# Patient Record
Sex: Male | Born: 1970
Health system: Southern US, Community
[De-identification: ages and names within clinical notes are randomized; demographics above are authoritative.]

## PROBLEM LIST (undated history)

## (undated) DIAGNOSIS — G709 Myoneural disorder, unspecified: Secondary | ICD-10-CM

## (undated) DIAGNOSIS — S99912A Unspecified injury of left ankle, initial encounter: Secondary | ICD-10-CM

## (undated) DIAGNOSIS — R51 Headache: Secondary | ICD-10-CM

## (undated) DIAGNOSIS — T7840XA Allergy, unspecified, initial encounter: Secondary | ICD-10-CM

## (undated) DIAGNOSIS — H3320 Serous retinal detachment, unspecified eye: Secondary | ICD-10-CM

## (undated) DIAGNOSIS — K589 Irritable bowel syndrome without diarrhea: Secondary | ICD-10-CM

## (undated) DIAGNOSIS — K219 Gastro-esophageal reflux disease without esophagitis: Secondary | ICD-10-CM

## (undated) DIAGNOSIS — K259 Gastric ulcer, unspecified as acute or chronic, without hemorrhage or perforation: Secondary | ICD-10-CM

## (undated) DIAGNOSIS — F32A Depression, unspecified: Secondary | ICD-10-CM

## (undated) DIAGNOSIS — F419 Anxiety disorder, unspecified: Secondary | ICD-10-CM

## (undated) DIAGNOSIS — K644 Residual hemorrhoidal skin tags: Secondary | ICD-10-CM

## (undated) DIAGNOSIS — S99911A Unspecified injury of right ankle, initial encounter: Secondary | ICD-10-CM

## (undated) DIAGNOSIS — K625 Hemorrhage of anus and rectum: Secondary | ICD-10-CM

## (undated) DIAGNOSIS — F329 Major depressive disorder, single episode, unspecified: Secondary | ICD-10-CM

## (undated) DIAGNOSIS — E7800 Pure hypercholesterolemia, unspecified: Secondary | ICD-10-CM

## (undated) DIAGNOSIS — H269 Unspecified cataract: Secondary | ICD-10-CM

## (undated) HISTORY — PX: UPPER GASTROINTESTINAL ENDOSCOPY: SHX188

## (undated) HISTORY — DX: Irritable bowel syndrome, unspecified: K58.9

## (undated) HISTORY — DX: Headache: R51

## (undated) HISTORY — DX: Anxiety disorder, unspecified: F41.9

## (undated) HISTORY — DX: Serous retinal detachment, unspecified eye: H33.20

## (undated) HISTORY — PX: OTHER SURGICAL HISTORY: SHX169

## (undated) HISTORY — DX: Unspecified injury of right ankle, initial encounter: S99.911A

## (undated) HISTORY — PX: RETINAL DETACHMENT SURGERY: SHX105

## (undated) HISTORY — DX: Allergy, unspecified, initial encounter: T78.40XA

## (undated) HISTORY — DX: Hemorrhage of anus and rectum: K62.5

## (undated) HISTORY — DX: Major depressive disorder, single episode, unspecified: F32.9

## (undated) HISTORY — PX: CATARACT EXTRACTION, BILATERAL: SHX1313

## (undated) HISTORY — DX: Unspecified injury of left ankle, initial encounter: S99.912A

## (undated) HISTORY — PX: POLYPECTOMY: SHX149

## (undated) HISTORY — DX: Depression, unspecified: F32.A

## (undated) HISTORY — PX: COLONOSCOPY: SHX174

## (undated) HISTORY — PX: NASAL SEPTUM SURGERY: SHX37

## (undated) HISTORY — DX: Residual hemorrhoidal skin tags: K64.4

## (undated) HISTORY — PX: HERNIA REPAIR: SHX51

## (undated) HISTORY — DX: Unspecified cataract: H26.9

## (undated) HISTORY — DX: Gastro-esophageal reflux disease without esophagitis: K21.9

## (undated) HISTORY — DX: Myoneural disorder, unspecified: G70.9

---

## 2004-11-11 ENCOUNTER — Encounter: Payer: Self-pay | Admitting: Physician Assistant

## 2004-11-16 ENCOUNTER — Encounter: Payer: Self-pay | Admitting: Physician Assistant

## 2008-01-04 ENCOUNTER — Ambulatory Visit: Admission: RE | Admit: 2008-01-04 | Discharge: 2008-01-04 | Payer: Self-pay | Admitting: Ophthalmology

## 2008-06-19 ENCOUNTER — Ambulatory Visit (HOSPITAL_COMMUNITY): Admission: RE | Admit: 2008-06-19 | Discharge: 2008-06-19 | Payer: Self-pay | Admitting: Chiropractic Medicine

## 2008-07-09 ENCOUNTER — Ambulatory Visit: Payer: Self-pay | Admitting: Internal Medicine

## 2008-07-09 DIAGNOSIS — R413 Other amnesia: Secondary | ICD-10-CM | POA: Insufficient documentation

## 2008-07-09 LAB — CONVERTED CEMR LAB
AST: 26 units/L (ref 0–37)
Albumin: 4.6 g/dL (ref 3.5–5.2)
Alkaline Phosphatase: 65 units/L (ref 39–117)
Chloride: 108 meq/L (ref 96–112)
Eosinophils Relative: 8.4 % — ABNORMAL HIGH (ref 0.0–5.0)
Folate: >20 ng/mL
Free T4: 0.7 ng/dL (ref 0.6–1.6)
GFR calc Af Amer: 108 mL/min
GFR calc non Af Amer: 89 mL/min
HCT: 43.7 % (ref 39.0–52.0)
HDL: 49.5 mg/dL (ref >39.0)
Hemoglobin, Urine: NEGATIVE
LDL Cholesterol: 123 mg/dL — ABNORMAL HIGH (ref 0–99)
Lymphocytes Relative: 39.8 % (ref 12.0–46.0)
Monocytes Absolute: 0.5 10*3/uL (ref 0.1–1.0)
Monocytes Relative: 8.7 % (ref 3.0–12.0)
Mucus, UA: NEGATIVE
Nitrite: NEGATIVE
Platelets: 214 10*3/uL (ref 150–400)
Potassium: 4.8 meq/L (ref 3.5–5.1)
Sodium: 143 meq/L (ref 135–145)
Specific Gravity, Urine: 1.02 (ref 1.000–1.03)
Total Bilirubin: 2.2 mg/dL — ABNORMAL HIGH (ref 0.3–1.2)
Total CHOL/HDL Ratio: 4
Total Protein, Urine: NEGATIVE mg/dL
Urine Glucose: NEGATIVE mg/dL
Vitamin B-12: 380 pg/mL (ref 211–911)
WBC, UA: NONE SEEN cells/hpf
WBC: 5.6 10*3/uL (ref 4.5–10.5)
pH: 6.5 (ref 5.0–8.0)

## 2008-07-10 ENCOUNTER — Encounter: Payer: Self-pay | Admitting: Internal Medicine

## 2008-07-10 LAB — CONVERTED CEMR LAB: Chlamydia, Swab/Urine, PCR: NEGATIVE

## 2008-07-12 ENCOUNTER — Encounter: Payer: Self-pay | Admitting: Internal Medicine

## 2008-08-04 ENCOUNTER — Ambulatory Visit: Payer: Self-pay | Admitting: Internal Medicine

## 2008-08-05 ENCOUNTER — Encounter: Payer: Self-pay | Admitting: Internal Medicine

## 2008-08-07 ENCOUNTER — Telehealth: Payer: Self-pay | Admitting: Internal Medicine

## 2009-02-19 ENCOUNTER — Ambulatory Visit: Payer: Self-pay | Admitting: Internal Medicine

## 2009-02-19 DIAGNOSIS — F4321 Adjustment disorder with depressed mood: Secondary | ICD-10-CM | POA: Insufficient documentation

## 2009-03-19 ENCOUNTER — Ambulatory Visit: Payer: Self-pay | Admitting: Internal Medicine

## 2009-03-19 DIAGNOSIS — J069 Acute upper respiratory infection, unspecified: Secondary | ICD-10-CM | POA: Insufficient documentation

## 2009-04-06 ENCOUNTER — Ambulatory Visit: Payer: Self-pay | Admitting: Internal Medicine

## 2009-05-08 ENCOUNTER — Ambulatory Visit: Payer: Self-pay | Admitting: Internal Medicine

## 2009-06-29 ENCOUNTER — Ambulatory Visit: Payer: Self-pay | Admitting: Internal Medicine

## 2009-06-29 DIAGNOSIS — H6990 Unspecified Eustachian tube disorder, unspecified ear: Secondary | ICD-10-CM | POA: Insufficient documentation

## 2009-06-29 DIAGNOSIS — H6980 Other specified disorders of Eustachian tube, unspecified ear: Secondary | ICD-10-CM | POA: Insufficient documentation

## 2009-06-29 DIAGNOSIS — R0982 Postnasal drip: Secondary | ICD-10-CM | POA: Insufficient documentation

## 2009-08-14 ENCOUNTER — Ambulatory Visit: Payer: Self-pay | Admitting: Internal Medicine

## 2009-08-14 DIAGNOSIS — M79609 Pain in unspecified limb: Secondary | ICD-10-CM | POA: Insufficient documentation

## 2009-08-14 DIAGNOSIS — K644 Residual hemorrhoidal skin tags: Secondary | ICD-10-CM | POA: Insufficient documentation

## 2009-08-14 DIAGNOSIS — R197 Diarrhea, unspecified: Secondary | ICD-10-CM | POA: Insufficient documentation

## 2009-08-27 ENCOUNTER — Encounter: Payer: Self-pay | Admitting: Internal Medicine

## 2009-09-25 ENCOUNTER — Ambulatory Visit: Payer: Self-pay | Admitting: Internal Medicine

## 2009-09-25 DIAGNOSIS — R1084 Generalized abdominal pain: Secondary | ICD-10-CM | POA: Insufficient documentation

## 2009-09-25 LAB — CONVERTED CEMR LAB
ALT: 16 units/L (ref 0–53)
AST: 20 units/L (ref 0–37)
Albumin: 4.9 g/dL (ref 3.5–5.2)
Basophils Absolute: 0.1 10*3/uL (ref 0.0–0.1)
Basophils Relative: 1 % (ref 0–1)
Bilirubin, Direct: 0.3 mg/dL (ref 0.0–0.3)
Calcium: 10 mg/dL (ref 8.4–10.5)
Creatinine, Ser: 0.92 mg/dL (ref 0.40–1.50)
Hemoglobin: 14.3 g/dL (ref 13.0–17.0)
Indirect Bilirubin: 1.2 mg/dL — ABNORMAL HIGH (ref 0.0–0.9)
Lipase: 25 units/L (ref 0–75)
Lymphocytes Relative: 48 % — ABNORMAL HIGH (ref 12–46)
MCHC: 34 g/dL (ref 30.0–36.0)
Monocytes Absolute: 0.5 10*3/uL (ref 0.1–1.0)
Neutro Abs: 2.6 10*3/uL (ref 1.7–7.7)
Neutrophils Relative %: 39 % — ABNORMAL LOW (ref 43–77)
RBC: 4.77 M/uL (ref 4.22–5.81)
RDW: 12.7 % (ref 11.5–15.5)
Sed Rate: 2 mm/hr (ref 0–16)
Sodium: 141 meq/L (ref 135–145)
Tissue Transglutaminase Ab, IgA: 5.7 units (ref <20)
Total Bilirubin: 1.5 mg/dL — ABNORMAL HIGH (ref 0.3–1.2)
Total Protein: 7.1 g/dL (ref 6.0–8.3)

## 2009-09-30 ENCOUNTER — Telehealth: Payer: Self-pay | Admitting: Internal Medicine

## 2009-10-04 ENCOUNTER — Encounter: Payer: Self-pay | Admitting: Internal Medicine

## 2009-10-04 ENCOUNTER — Telehealth: Payer: Self-pay | Admitting: Internal Medicine

## 2009-10-05 ENCOUNTER — Telehealth: Payer: Self-pay | Admitting: Internal Medicine

## 2009-10-06 ENCOUNTER — Telehealth: Payer: Self-pay | Admitting: Gastroenterology

## 2009-10-13 ENCOUNTER — Ambulatory Visit: Payer: Self-pay | Admitting: Gastroenterology

## 2009-10-13 DIAGNOSIS — R109 Unspecified abdominal pain: Secondary | ICD-10-CM | POA: Insufficient documentation

## 2009-10-13 DIAGNOSIS — K219 Gastro-esophageal reflux disease without esophagitis: Secondary | ICD-10-CM | POA: Insufficient documentation

## 2009-10-13 DIAGNOSIS — R1310 Dysphagia, unspecified: Secondary | ICD-10-CM | POA: Insufficient documentation

## 2009-10-19 ENCOUNTER — Telehealth (INDEPENDENT_AMBULATORY_CARE_PROVIDER_SITE_OTHER): Payer: Self-pay | Admitting: *Deleted

## 2009-10-27 ENCOUNTER — Encounter (INDEPENDENT_AMBULATORY_CARE_PROVIDER_SITE_OTHER): Payer: Self-pay | Admitting: *Deleted

## 2009-10-30 ENCOUNTER — Ambulatory Visit: Payer: Self-pay | Admitting: Gastroenterology

## 2009-11-11 ENCOUNTER — Ambulatory Visit: Payer: Self-pay | Admitting: Gastroenterology

## 2009-11-11 LAB — HM COLONOSCOPY: HM Colonoscopy: NORMAL

## 2009-11-27 ENCOUNTER — Ambulatory Visit: Payer: Self-pay | Admitting: Internal Medicine

## 2009-12-03 ENCOUNTER — Encounter: Payer: Self-pay | Admitting: Internal Medicine

## 2010-01-08 ENCOUNTER — Ambulatory Visit: Payer: Self-pay | Admitting: Gastroenterology

## 2010-05-05 ENCOUNTER — Encounter: Payer: Self-pay | Admitting: Internal Medicine

## 2010-05-06 ENCOUNTER — Ambulatory Visit: Payer: Self-pay | Admitting: Internal Medicine

## 2010-05-06 ENCOUNTER — Encounter: Payer: Self-pay | Admitting: Internal Medicine

## 2010-05-06 LAB — CONVERTED CEMR LAB
ALT: 14 U/L
AST: 21 U/L
Albumin: 5.1 g/dL
Alkaline Phosphatase: 71 U/L
BUN: 18 mg/dL
Bilirubin, Direct: 0.3 mg/dL
CO2: 29 meq/L
Calcium: 10.2 mg/dL
Chloride: 102 meq/L
Creatinine, Ser: 0.95 mg/dL
Glucose, Bld: 93 mg/dL
Hgb A1c MFr Bld: 5.3 %
Indirect Bilirubin: 1.4 mg/dL — ABNORMAL HIGH
Potassium: 5.3 meq/L
Sodium: 141 meq/L
TSH: 1.664 u[IU]/mL
Total Bilirubin: 1.7 mg/dL — ABNORMAL HIGH
Total Protein: 7.2 g/dL
Vitamin B-12: 742 pg/mL

## 2010-05-10 ENCOUNTER — Encounter: Payer: Self-pay | Admitting: Internal Medicine

## 2010-05-11 ENCOUNTER — Ambulatory Visit: Payer: Self-pay | Admitting: Internal Medicine

## 2010-05-11 DIAGNOSIS — D239 Other benign neoplasm of skin, unspecified: Secondary | ICD-10-CM | POA: Insufficient documentation

## 2010-05-11 DIAGNOSIS — R748 Abnormal levels of other serum enzymes: Secondary | ICD-10-CM | POA: Insufficient documentation

## 2010-05-12 ENCOUNTER — Telehealth: Payer: Self-pay | Admitting: Internal Medicine

## 2010-05-12 ENCOUNTER — Ambulatory Visit (HOSPITAL_BASED_OUTPATIENT_CLINIC_OR_DEPARTMENT_OTHER)
Admission: RE | Admit: 2010-05-12 | Discharge: 2010-05-12 | Payer: Self-pay | Source: Home / Self Care | Attending: Surgical Oncology | Admitting: Surgical Oncology

## 2010-05-14 ENCOUNTER — Encounter: Payer: Self-pay | Admitting: Internal Medicine

## 2010-05-15 ENCOUNTER — Encounter: Payer: Self-pay | Admitting: Internal Medicine

## 2010-06-11 IMAGING — CR DG THORACIC SPINE 2V
5 series · 5 of 5 positions shown · non-contrast
Comparison: Cervical radiographs done concurrently.

CLINICAL DATA: Chronic cervicothoracic pain.  No acute injury.

THORACIC SPINE - 2 VIEW

[t t-spine a.p.]
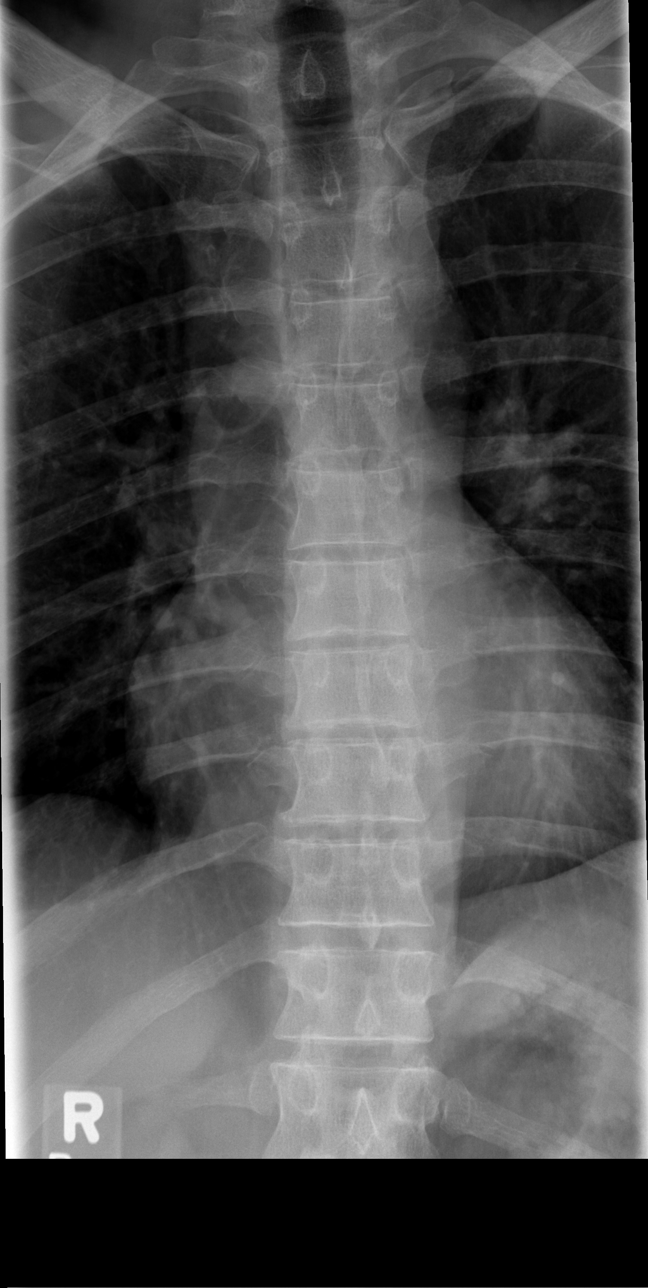

[t t-spine lat (1 of 2)]
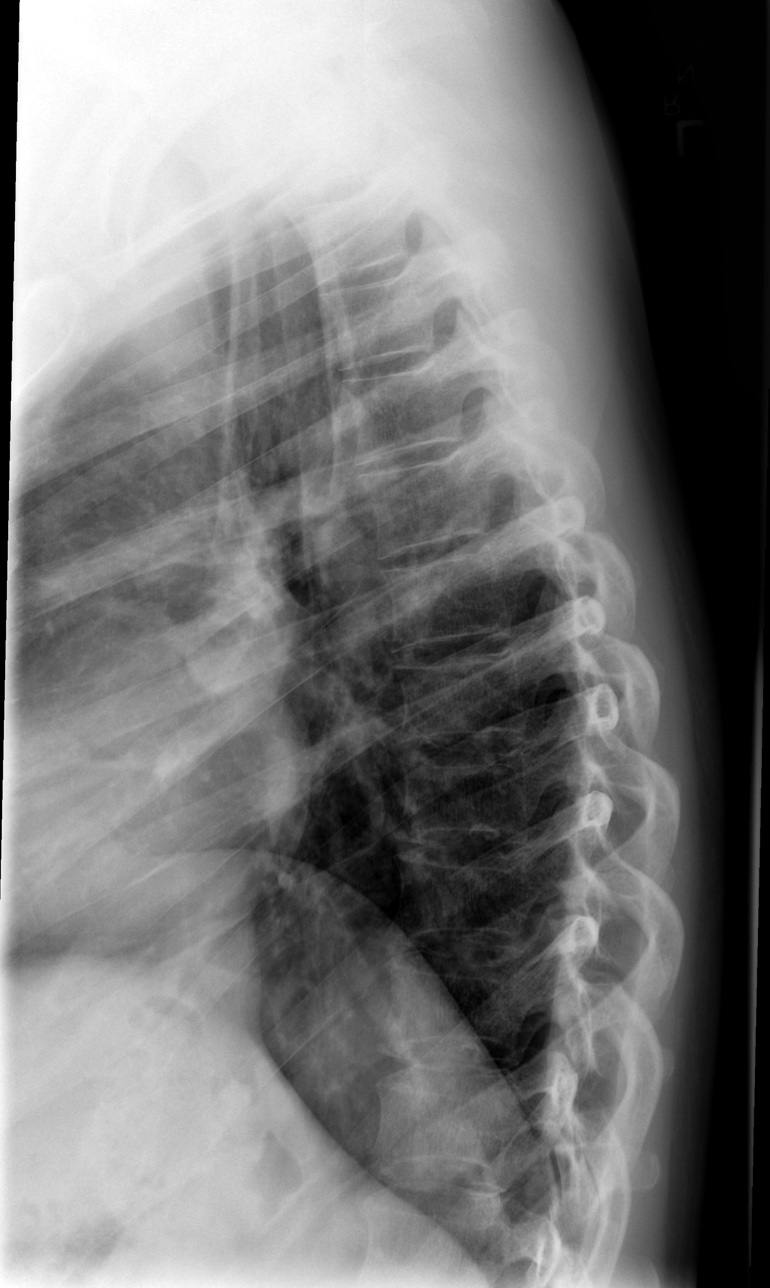

[t t-spine lat (2 of 2)]
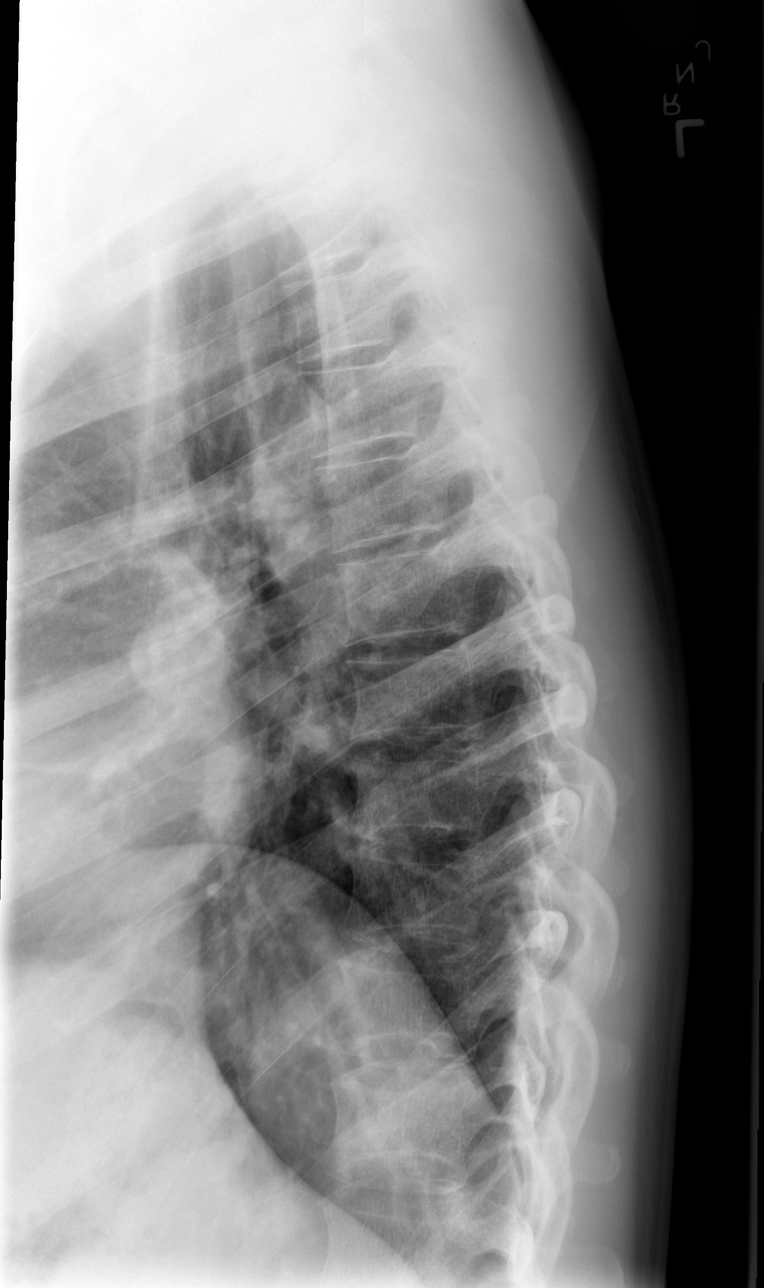

[t swimmers * (1 of 2)]
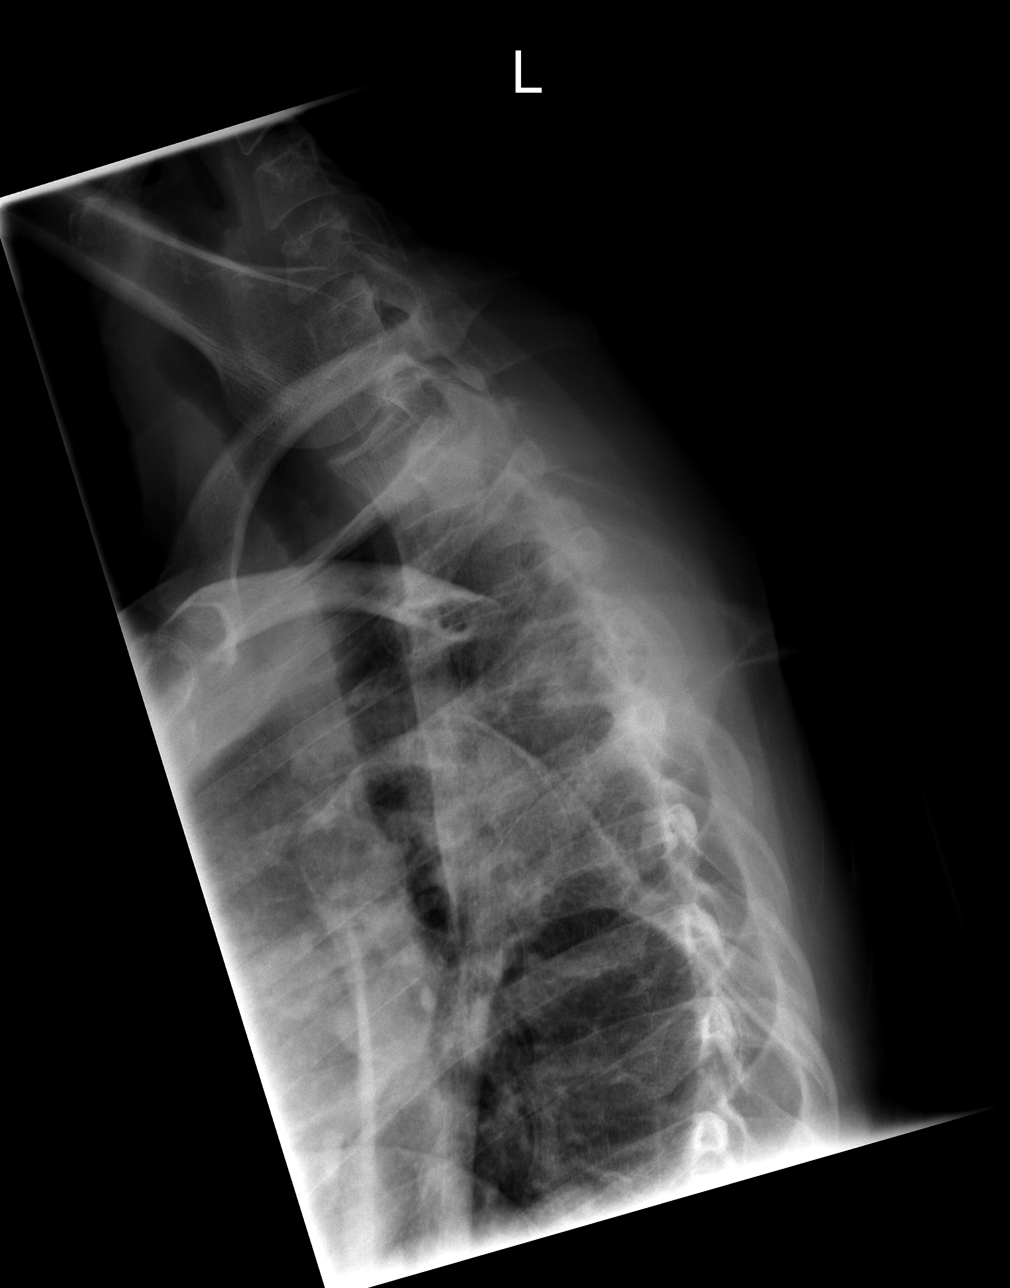

[t swimmers * (2 of 2)]
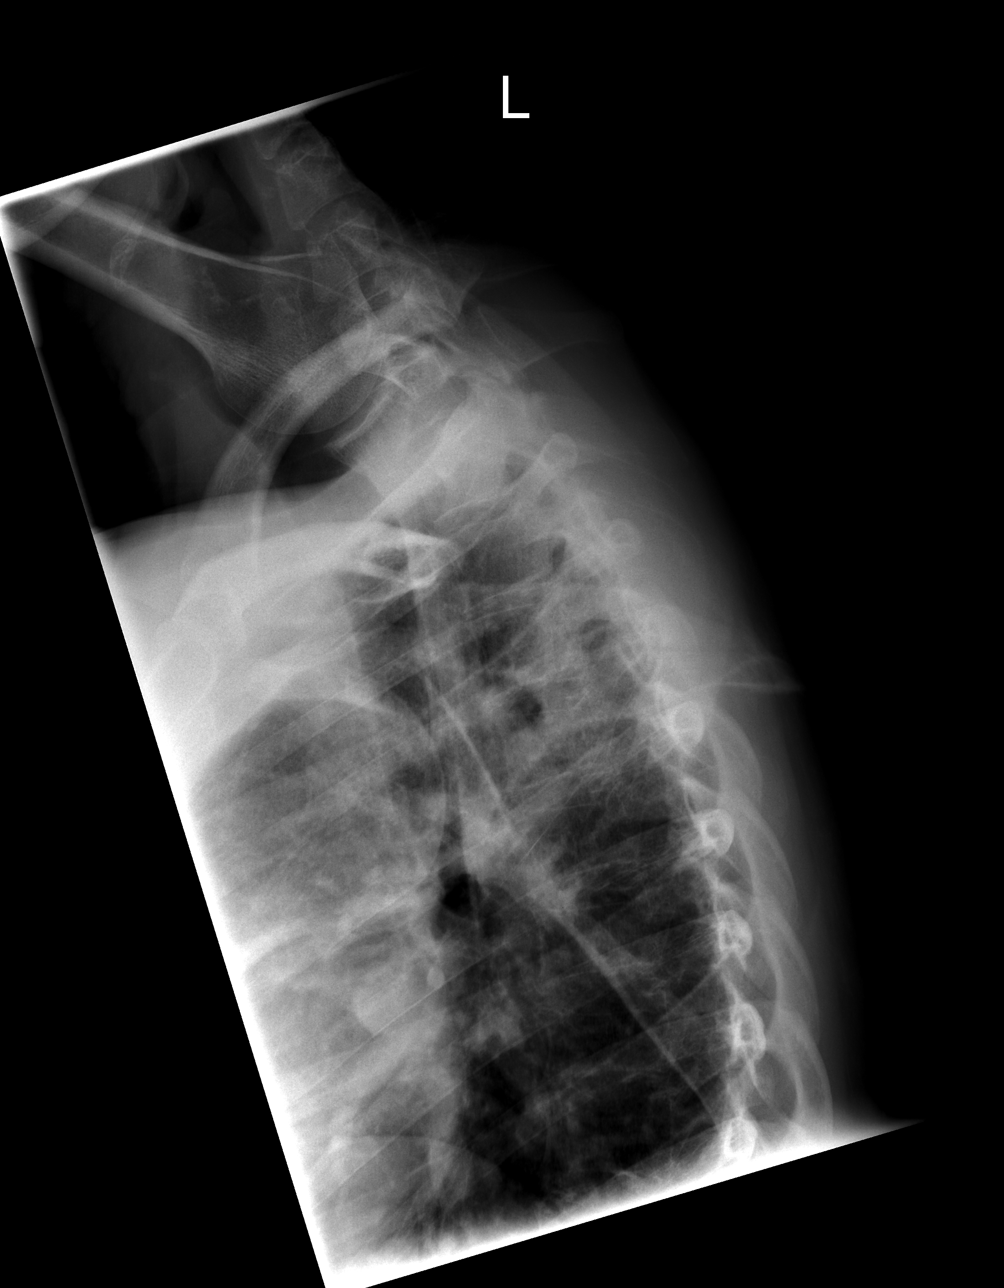

[5 of 5 positions shown; findings below may reference images not displayed]

FINDINGS: There is conventional anatomy with 12 rib-bearing
thoracic type vertebral bodies.  The alignment is normal.  There is
no evidence of fracture, paraspinal hematoma or widening of the
interpediculate distance.  The thoracic disc spaces appear
preserved.
IMPRESSION: Negative thoracic spine series.

## 2010-06-11 IMAGING — CR DG CERVICAL SPINE COMPLETE 4+V
5 series · 5 of 5 positions shown · non-contrast
Comparison: None.

CLINICAL DATA: Diffuse neck pain.  No acute injury.

CERVICAL SPINE - COMPLETE 4+ VIEW

[w c-spine lat *]
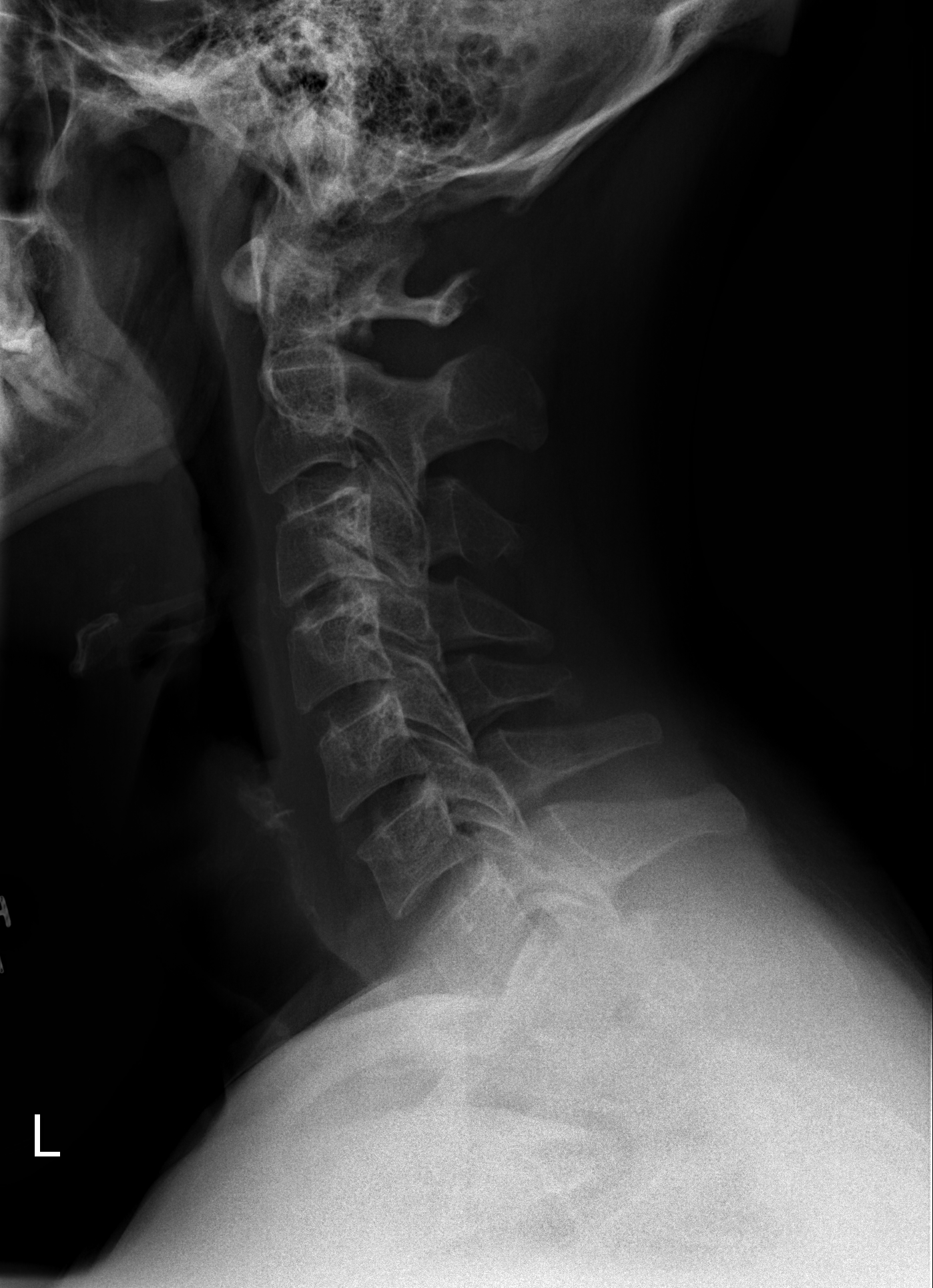

[w c-spine oblique * (1 of 2)]
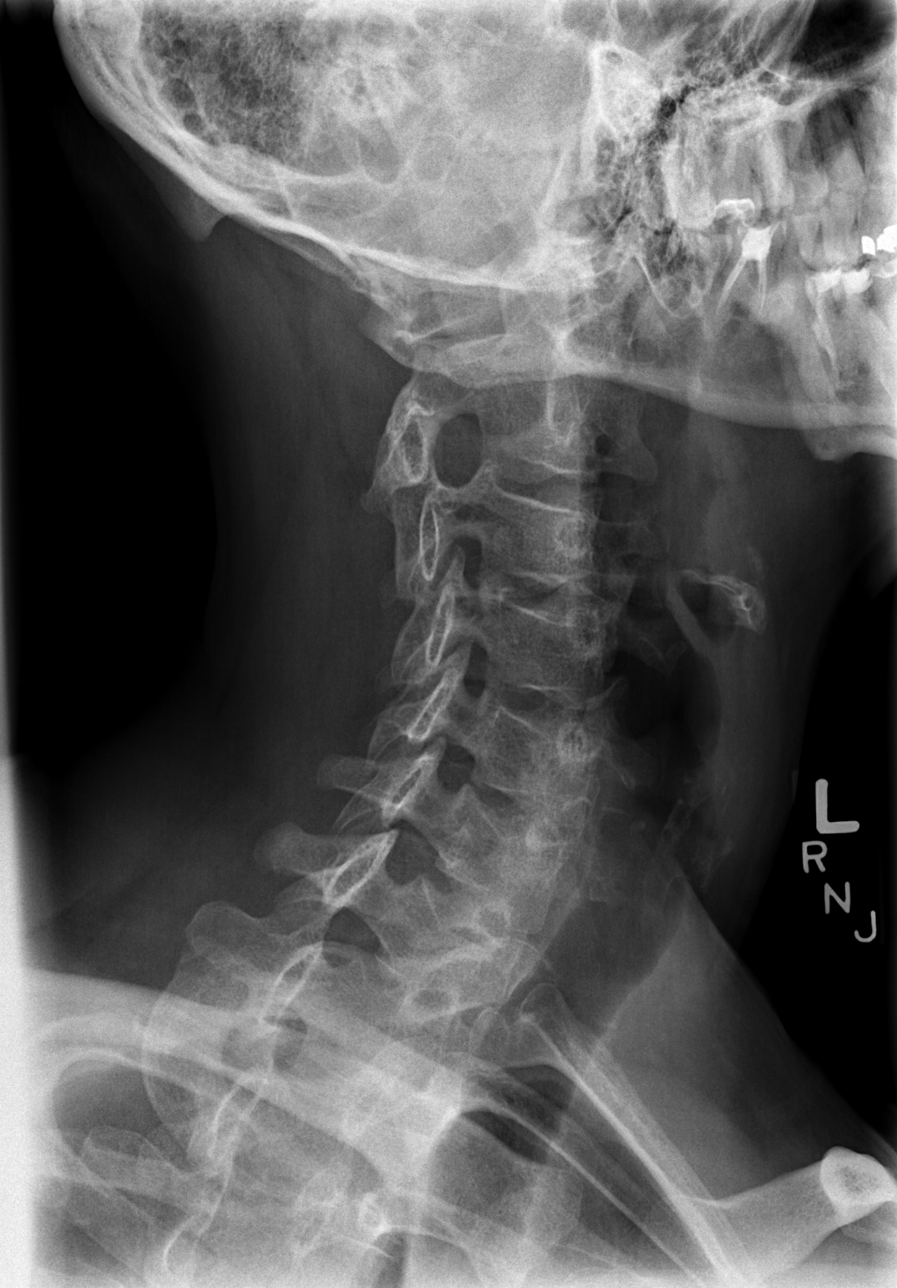

[w c-spine oblique * (2 of 2)]
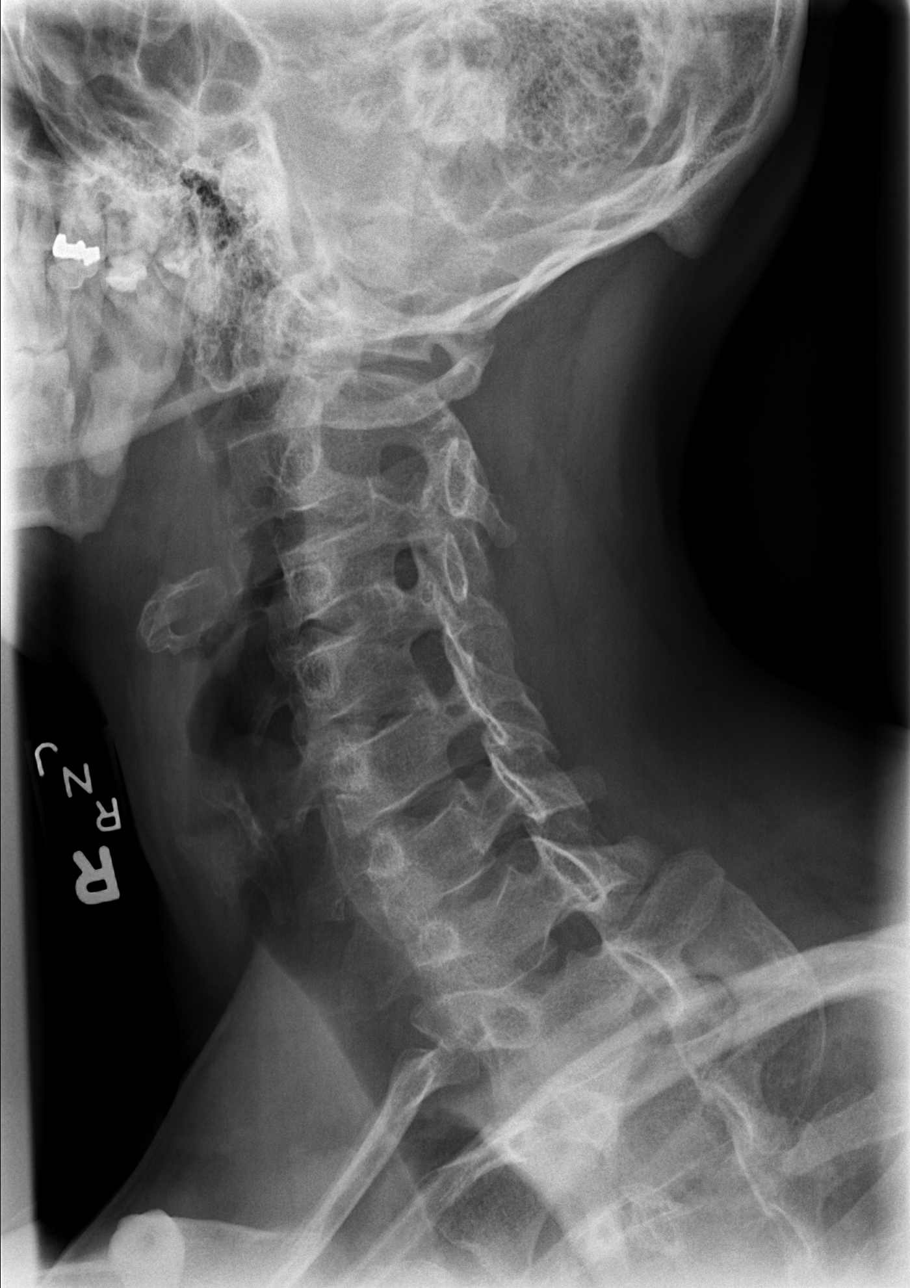

[w c-spine a.p. *]
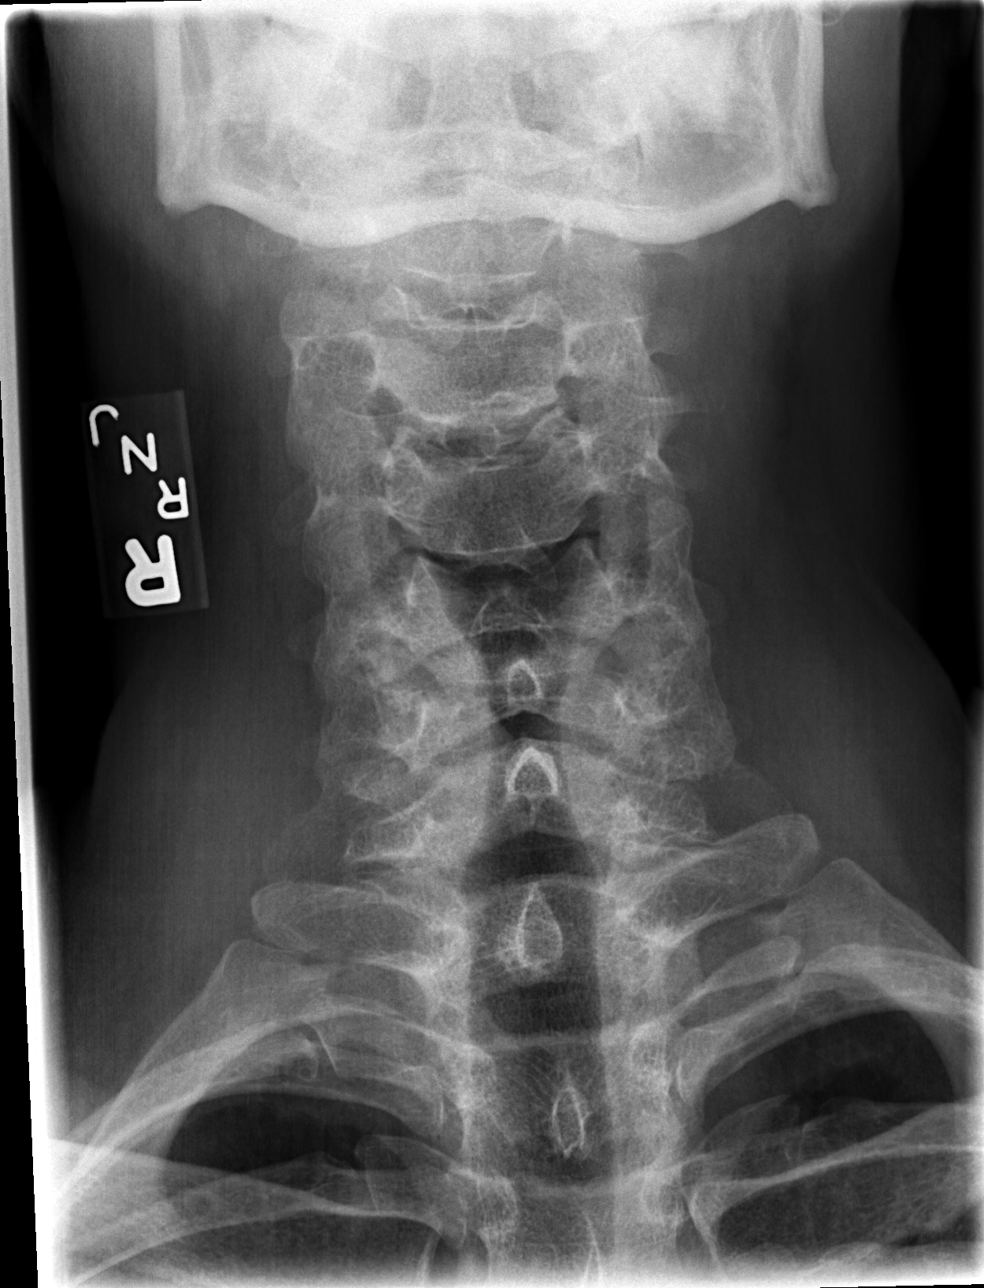

[w c-spine odontoid *]
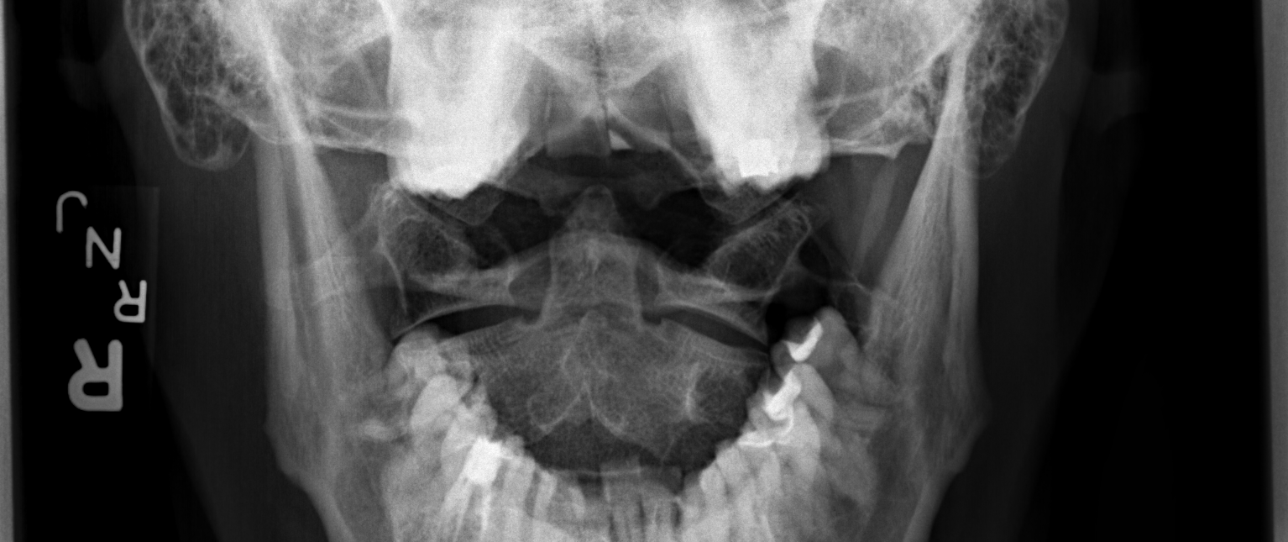

[5 of 5 positions shown; findings below may reference images not displayed]

FINDINGS: The prevertebral soft tissues are normal.  The alignment
is anatomic through T1.  The cervical disc spaces are preserved.
The oblique views demonstrate probable mild biforaminal stenosis at
C3-C4 due to facet hypertrophy.  The additional neural foramina are
widely patent.  The C1-C2 articulation appears normal in the AP
projection.  There is no evidence of acute fracture or subluxation.
IMPRESSION: No acute findings.  Possible mild biforaminal stenosis at C3-C4.

## 2010-06-29 NOTE — Assessment & Plan Note (Signed)
  Review of gastrointestinal problems: 1. IBS-diarrhea predominant: colonoscopy June 2011 showed diverticulosis, normal TI, normal random colon biopsies.    History of Present Illness Visit Type: Follow-up Visit Primary GI MD: Rob Bunting MD Primary Ashtyn Freilich: Dondra Spry DO Chief Complaint: 6 week f/u IBS, Colonoscopy History of Present Illness:     very pleasant 40 year old man whom I last saw the time of his colonoscopy. See those results summarized above.cut out ice cream, cheese;  noticed these were causing stomach problems.  Has been taking one immodium, once daily.  this is helping. He thinks the antispasmodics medicines helped a bit better however.           Current Medications (verified): 1)  Travatan 0.004 % Soln (Travoprost) .... One Drop Each Eye Once Daily 2)  Digestive Advantage Intensive Bowel Support .... Take 1 Capsule By Mouth Once A Day 3)  Timentin 3.1 Gm/133ml Soln (Ticarcillin-Pot Clavulanate) .... Left Eye Once Daily  Allergies (verified): No Known Drug Allergies  Vital Signs:  Patient profile:   40 year old male Height:      74 inches Weight:      197.25 pounds BMI:     25.42 Pulse rate:   70 / minute Pulse rhythm:   regular BP sitting:   122 / 74  (left arm) Cuff size:   regular  Vitals Entered By: Christie Nottingham CMA Duncan Dull) (January 08, 2010 1:47 PM)  Physical Exam  Additional Exam:  Constitutional: generally well appearing Psychiatric: alert and oriented times 3 Abdomen: soft, non-tender, non-distended, normal bowel sounds    Impression & Recommendations:  Problem # 1:  diarrhea predominant IBS he will continue on either Imodium or scheduled antispasmodics. Both seem to help him well. He knows to return if he has any further questions or concerns.  Patient Instructions: 1)  Will switch the levsin to twice daily, scheduled antiemetics. 2)  Stop the immodium. 3)  Return to see Dr. Christella Hartigan as needed. 4)  The medication list was  reviewed and reconciled.  All changed / newly prescribed medications were explained.  A complete medication list was provided to the patient / caregiver. Prescriptions: LEVBID 0.375 MG  TB12 (HYOSCYAMINE SULFATE) take one pill two times a day  #60 x 6   Entered and Authorized by:   Rachael Fee MD   Signed by:   Rachael Fee MD on 01/08/2010   Method used:   Faxed to ...       [id number] (retail)       [address]       [place], [place]  [address]       Ph: [phone]       Fax: [phone]   RxID:   [id number]

## 2010-06-29 NOTE — Assessment & Plan Note (Signed)
Summary: Sharp pain in right foot- jr   Vital Signs:  Patient profile:   40 year old male Weight:      193 pounds Temp:     98.1 degrees F oral Pulse rate:   68 / minute Pulse rhythm:   regular Resp:     16 per minute BP sitting:   110 / 70  (right arm) Cuff size:   regular  Vitals Entered By: Mervin Kung CMA (August 14, 2009 1:24 PM) Is Patient Diabetic? No   Primary Care Provider:  Dondra Spry DO   History of Present Illness: 40 y/o white male c/o intermittent right foot pain x 1-2 weeks.   no injury or trauma. 2-4th digits are numb no change in skin coler painful to walk  hemorrhoids - rectal bleeding.  prev episode in 2005-2006 freq BMs.  some are like pellets, other times loose can't tolerate any cheese however, he eats cereal every moring (2 bowls) with milk abd bloating, gassy avoids golf outings due to symptoms  no family hx of IBD  Allergies (verified): No Known Drug Allergies  Past History:  Past Medical History: History of concussions History of prostatitis      Depression      External hemorrhoids Hx of rectal bleeding - negative colonoscopy 2005-2006  Past Surgical History: colonoscopy 2005-2006 ([facility])   Family History: Family History Hypertension (father) Family History Diabetes 1st degree relative (father) Family History High cholesterol Depression - PGF (committed suicide) Colon ca - no Prostate ca - MGF Hypothyroidism   No family of IBD          Social History: Married (18 months) No children Never Smoked   Alcohol use-yes    Regular exercise-yes        Physical Exam  General:  alert, well-developed, and well-nourished.   Lungs:  normal respiratory effort and normal breath sounds.   Heart:  normal rate, regular rhythm, and no gallop.   Abdomen:  mild lower abd tenderness,  no massessoft, normal bowel sounds, no hepatomegaly, and no splenomegaly.   Rectal:  normal sphincter tone, no masses, and external  hemorrhoid(s).   Msk:  right foot - no redness,  no tenderness,  no joint deformity Pulses:  dorsalis pedis and posterior tibial pulses are full and equal bilaterally Extremities:  No lower extremity edema    Impression & Recommendations:  Problem # 1:  HEMORRHOIDS, EXTERNAL (ICD-455.3) Pt with ext hemorrhoids.  use hemorrhoid cream.  use sitz bath.  I doubt he will need hemorrhoid surgery.  Problem # 2:  FOOT PAIN, RIGHT (ICD-729.5) 40 y/o with intermittent sharp right foot/toe pain.  2-4 th digit.  he also complains of tingling sensation.  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  consider morton's neuroma.  use OTC NSAIDs for now.  refer to Dr. Victorino Dike for further eval and tx Orders: Orthopedic Referral (Ortho)  Problem # 3:  LOOSE STOOLS (ICD-787.91) Pt with chronic loose stools.  He likely has lactose intolerance.  use lactaid milk.  If persistent symptoms, consider GI workup.  possible IBS  Complete Medication List: 1)  Travatan 0.004 % Soln (Travoprost) .... One drop each eye once daily 2)  Hydrocortisone Ace-pramoxine 2.5-1 % Crea (Hydrocortisone ace-pramoxine) .... Apply two times a day x 7 days  Patient Instructions: 1)  Use lactaid mild (low fat).  2)  Please schedule a follow-up appointment in 1 month. 3)  Take ibuprofen 400 mg two times a day as needed with food for 3-5 days.  4)  Sitz bath daily (epsom salt) for 3-5 days Prescriptions: HYDROCORTISONE ACE-PRAMOXINE 2.5-1 % CREA (HYDROCORTISONE ACE-PRAMOXINE) apply two times a day x 7 days  #60 grams x 0   Entered and Authorized by:   D. Thomos Lemons DO   Signed by:   D. Thomos Lemons DO on 08/14/2009   Method used:   Electronically to        [facility] [id number] (retail)       [address] [place], [place]  [address]       Ph: [phone] or [phone]       Fax: [phone]   RxID:   [id number]   Current Allergies (reviewed today): No known allergies

## 2010-06-29 NOTE — Assessment & Plan Note (Signed)
Summary: Abd pain/dfs   History of Present Illness Visit Type: Initial Visit Primary GI MD: Rob Bunting MD Primary Provider: Dondra Spry DO Chief Complaint: abdominal pain that has subsided when patient started Omeprazole & hyoscyamine History of Present Illness:   PLEASANT 40 YO MALE  NEW TO GI TODAY, SENT BY DR. Artist Pais  FOR EVALUATION OF ABDOMINAL PAIN,CRAMPING OVER THE PAST FEW MONTHS. HE HAS ALSO HAD AN INCREASE IN ACID REFLUX SXS. HE DESCRIBES MID ABDOMINAL CRAMPY TYPE DISCOMFORT WHICH IS NOT SEVERE BUT HAS BEEN PERSISTENT, AND "ANNOYING".HE HAS BEEN HAVING 4 BM'S/DAY,NO MELENA OR HEME.HE HAS INTERMITTENT EPISODES OF POST PRANDIAL DIARRHEA,AND OCCASIONAL NOCTURNAL EPISODES. HE HAS TRIED LACTOSE AVOIDANCE WITH NO IMPROVEMENT IN SXS, AND BRIEFLY TRIED A GLUTEN FREE DIET WITH NO CHANGE.  HE HAD A COLONOSCOPY ABOUT 5 YEARS AGO IN [place] ,WHICH HE WAS TOLD WAS NORMAL.(DR. REITER). HE HAS HAD SCREENING FOR CELIAC DISEASE  WITH NEGATIVE MARKERS, AND BASELINE LABS ALSO UNREMARKABLE.DR. YOO STARTED OMEPRAZOLE AND LEVSIN  LAST WEEK AND HE FEELS BETTER.  PT ALSO REPORTS THAT HIS SXS ARE TYPICALLY WORSE IN THE WINTER WHEN HE IS SIGNIFICANTLY MORE STRESSED AT WORK.   GI Review of Systems    Reports abdominal pain, acid reflux, and  bloating.     Location of  Abdominal pain: upper abdomen.    Denies belching, chest pain, dysphagia with liquids, dysphagia with solids, heartburn, loss of appetite, nausea, vomiting, vomiting blood, weight loss, and  weight gain.      Reports diarrhea, hemorrhoids, irritable bowel syndrome, rectal bleeding, and  rectal pain.     Denies anal fissure, black tarry stools, change in bowel habit, constipation, diverticulosis, fecal incontinence, heme positive stool, jaundice, light color stool, and  liver problems.    Current Medications (verified): 1)  Travatan 0.004 % Soln (Travoprost) .... One Drop Each Eye Once Daily 2)  Hydrocortisone Ace-Pramoxine 2.5-1 % Crea  (Hydrocortisone Ace-Pramoxine) .... Apply Two Times A Day X 7 Days 3)  Hyoscyamine Sulfate 0.125 Mg Tabs (Hyoscyamine Sulfate) .... One By Mouth Three Times A Day As Needed For Abd Pain 4)  Omeprazole 20 Mg Cpdr (Omeprazole) .... One By Mouth Once Daily  Allergies (verified): No Known Drug Allergies  Past History:  Past Medical History: History of concussions History of prostatitis      Depression      External hemorrhoids Hx of rectal bleeding - negative colonoscopy 2005-2006  Chronic Headaches Irritable Bowel Syndrome  Past Surgical History: colonoscopy 2005-2006 ([facility])   Hernia Surgery Deviated septim Detached Retina  Family History: Reviewed history from 09/25/2009 and no changes required. Family History Hypertension (father) Family History Diabetes 1st degree relative (father) Family History High cholesterol Depression - PGF (committed suicide) Colon ca - no Prostate ca - MGF Hypothyroidism   No family of IBD           Social History: Reviewed history from 09/25/2009 and no changes required. Married (18 months) No children Never Smoked   Alcohol use-yes    Regular exercise-yes         Review of Systems       The patient complains of cough, depression-new, fatigue, sleeping problems, sore throat, and urination - excessive.  The patient denies allergy/sinus, anemia, anxiety-new, arthritis/joint pain, back pain, blood in urine, breast changes/lumps, change in vision, confusion, coughing up blood, fainting, fever, headaches-new, hearing problems, heart murmur, heart rhythm changes, itching, menstrual pain, muscle pains/cramps, night sweats, nosebleeds, pregnancy symptoms, shortness of breath, skin  rash, swelling of feet/legs, swollen lymph glands, thirst - excessive , urination - excessive , urination changes/pain, urine leakage, vision changes, and voice change.         ROS OTHERWISE AS IN HPI  Vital Signs:  Patient profile:   40 year old male Height:       74 inches Weight:      190.25 pounds BMI:     24.51 Pulse rate:   92 / minute Pulse rhythm:   regular BP sitting:   100 / 68  (left arm) Cuff size:   regular  Vitals Entered By: June McMurray CMA Duncan Dull) (Oct 13, 2009 8:30 AM)  Physical Exam  General:  Well developed, well nourished, no acute distress. Head:  Normocephalic and atraumatic. Eyes:  PERRLA, no icterus. Lungs:  Clear throughout to auscultation. Heart:  Regular rate and rhythm; no murmurs, rubs,  or bruits. Abdomen:  SOFT, MILDLY TENDER  MID ABDOMEN, NO MASS OR HSM, BS+ Rectal:  NOT DONE   Impression & Recommendations:  Problem # 1:  ABDOMINAL PAIN, GENERALIZED (ICD-789.07) Assessment Improved 40 YO MALE WITH 3+ MONTH HX OF CRAMPY ABDOMINAL PAIN, MORE FREQUENT STOOLS, AND INTERMITTENT POST PRANDIAL DIARRHEA. REPORTED NEGATIVE COLONOSCOPY APPROX. 5 YEARS AGO. SXS MOST CONSISTENT WITH IBS,CONSIDER IBD.  LABS AS BELOW CONTINUE OMEPRAZOLE 20 MG DAILY IN AM CONTINUE LEVSIN 3 X DAILY. FOLLOW UP AS NEEDED WITH DR JACOBS-ADVISED PT THAT IF LABS UNREMARKABLE -MAY NEED FURTHER WORKUP. OBTAIN COPY OF COLONOSCOPY DONE PER DR. REITER(H.P.) Orders: TLB-CRP-High Sensitivity (C-Reactive Protein) (86140-FCRP) TLB-Sedimentation Rate (ESR) (85652-ESR)  Problem # 2:  GERD (ICD-530.81) Assessment: Improved SEE ABOVE  Patient Instructions: 1)  Your physician has requested that you have the following labwork done today: Go to basement level. 2)  We sent perscription for Omeprazole w/ 11 refills to [facility], [place]. 3)  We also sent persciption for Hyosyamine w/ 11 refills. 4)  Irritable Bowel Syndrome brochure given. 5)  Copy sent to : D. Thomos Lemons, DO 6)  The medication list was reviewed and reconciled.  All changed / newly prescribed medications were explained.  A complete medication list was provided to the patient / caregiver. Prescriptions: OMEPRAZOLE 20 MG CPDR (OMEPRAZOLE) Take 1 tab 30 min before breakfast  #30 x  11   Entered by:   Lowry Ram NCMA   Authorized by:   Sammuel Cooper PA-c   Signed by:   Lowry Ram NCMA on 10/13/2009   Method used:   Electronically to        [facility] [address] (retail)       [address] [place], [place]  [address]       Ph: [phone] or [phone]       Fax: [phone]   RxID:   [id number] HYOSCYAMINE SULFATE 0.125 MG TABS (HYOSCYAMINE SULFATE) one by mouth three times a day as needed for abd pain  #90 x 11   Entered by:   Lowry Ram NCMA   Authorized by:   Sammuel Cooper PA-c   Signed by:   Lowry Ram NCMA on 10/13/2009   Method used:   Electronically to        [facility] [address] (retail)       [address]       [place], [place]  [address]  Ph: [phone] or [phone]       Fax: [phone]   RxID:   [id number]

## 2010-06-29 NOTE — Assessment & Plan Note (Signed)
Summary: Nose Infection- jr   Vital Signs:  Patient profile:   40 year old male Weight:      119.75 pounds BMI:     15.43 O2 Sat:      100 % on Room air Temp:     98.1 degrees F oral Pulse rate:   70 / minute Pulse rhythm:   regular Resp:     18 per minute BP sitting:   110 / 80  (right arm) Cuff size:   large  Vitals Entered By: Glendell Docker CMA (June 29, 2009 8:17 AM)  O2 Flow:  Room air  Primary Care Provider:  D. Thomos Lemons DO  CC:  Sinus Infection.  History of Present Illness: 40 y/o white male c/o crusting around nares.  end of nose will occ get tender.  Prev symptoms promptly resolved with doxycycline.  occ nose bleeds  Occ lightheadedness.  ears click,  no ear pain.  no fever.  feels  stuffy  Allergies (verified): No Known Drug Allergies  Past History:  Past Medical History: History of concussions History of prostatitis      Depression     Family History: Family History Hypertension (father) Family History Diabetes 1st degree relative (father) Family History High cholesterol Depression - PGF (committed suicide) Colon ca - no Prostate ca - MGF Hypothyroidism          Social History: Married (18 months) No children Never Smoked   Alcohol use-yes  Regular exercise-yes        Physical Exam  General:  alert, well-developed, and well-nourished.   Eyes:  pupils equal, pupils round, and pupils reactive to light.  no nystagmus Ears:  R ear normal and L ear normal.   Nose:  mild mucosal erythema.  no external redness Lungs:  normal respiratory effort and normal breath sounds.   Heart:  normal rate, regular rhythm, and no gallop.     Impression & Recommendations:  Problem # 1:  NASAL DRAINAGE (ICD-784.91) Pt with intermittent crusting of nares.  tip off nose occ gets tender.  no obvious signs of infection.  use bactroban ointment x 2 wks.  Problem # 2:  EUSTACHIAN TUBE DYSFUNCTION (ICD-381.81) Pt with intermittent dizzines.  use OTC  decongestants as needed  Complete Medication List: 1)  Travatan 0.004 % Soln (Travoprost) .... One drop each eye once daily 2)  Mupirocin 2 % Oint (Mupirocin) .... Apply to nares two times a day x 2 weeks  Patient Instructions: 1)  Use OTC decongestant like claritin D or zyrtec D when you experience nasal / sinus congestion and dizziness. 2)  Call our office if your nasal symptoms do not  improve or gets worse. Prescriptions: MUPIROCIN 2 % OINT (MUPIROCIN) apply to nares two times a day x 2 weeks  #30 grams x 1   Entered and Authorized by:   D. Thomos Lemons DO   Signed by:   D. Thomos Lemons DO on 06/29/2009   Method used:   Electronically to        [facility] [address] (retail)       [address] [place], [place]  [address]       Ph: [phone] or [phone]       Fax: [phone]   RxID:   [id number]   Current Allergies (reviewed today): No known allergies

## 2010-06-29 NOTE — Procedures (Signed)
Summary: Colonoscopy  Patient: Isaiah Cordova Note: All result statuses are Final unless otherwise noted.  Tests: (1) Colonoscopy (COL)   COL Colonoscopy           DONE     [facility]     [address]     [place], [place]  [address]           COLONOSCOPY PROCEDURE REPORT           PATIENT:  [name], [name]  MR#:  [id number]     BIRTHDATE:  1971/04/25, 38 yrs. old  GENDER:  male     ENDOSCOPIST:  Rachael Fee, MD     REF. BY:  Thomos Lemons, DO     PROCEDURE DATE:  11/11/2009     PROCEDURE:  Colonoscopy with biopsy     ASA CLASS:  Class II     INDICATIONS:  chronic frequent stools (usually four a day),     intermittent abd pains that are improved with antispasms, PPI,     elevated CRP     MEDICATIONS:   Fentanyl 100 mcg IV, Versed 8 mg IV           DESCRIPTION OF PROCEDURE:   After the risks benefits and     alternatives of the procedure were thoroughly explained, informed     consent was obtained.  Digital rectal exam was performed and     revealed no rectal masses.   The LB [id number] endoscope     was introduced through the anus and advanced to the terminal ileum     which was intubated for a short distance, without limitations.     The quality of the prep was good, using MoviPrep.  The instrument     was then slowly withdrawn as the colon was fully examined.     <<PROCEDUREIMAGES>>           FINDINGS:  The terminal ileum appeared normal (see image3).  Mild     diverticulosis was found in the sigmoid to descending colon     segments (see image1).  This was otherwise a normal examination of     the colon (see image2 and image4).  Random colon biopsies were     taken and sent to pathology (jar 1).   Retroflexed views in the     rectum revealed no abnormalities.    The scope was then withdrawn     from the patient and the procedure completed.           COMPLICATIONS:  None     ENDOSCOPIC IMPRESSION:     1) Normal terminal ileum (no Crohn's)     2)  Mild diverticulosis in the sigmoid to descending colon     segments     3) Otherwise normal examination; random biopsies taken to check     for microscopic colitis           He likely has diarrhea predominant IBS.           RECOMMENDATIONS:     1) Continue current colorectal screening recommendations for     "routine risk" patients with a repeat colonoscopy in 10 years.     2) Await biopsy result for final recommendations.           REPEAT EXAM:  10 years           ______________________________     Rachael Fee, MD  n.     eSIGNED:   Rachael Fee at 11/11/2009 02:38 PM           [name], [name], [id number]  Note: An exclamation mark (!) indicates a result that was not dispersed into the flowsheet. Document Creation Date: 11/11/2009 2:39 PM _______________________________________________________________________  (1) Order result status: Final Collection or observation date-time: 11/11/2009 14:28 Requested date-time:  Receipt date-time:  Reported date-time:  Referring Physician:   Ordering Physician: Rob Bunting [id number]) Specimen Source:  Source: Launa Grill Order Number: [id number] Lab site:   Appended Document: Colonoscopy     Procedures Next Due Date:    Colonoscopy: 02/2020

## 2010-06-29 NOTE — Progress Notes (Signed)
Summary: Status Update  Phone Note Call from Patient Call back at Home Phone [phone]   Caller: Patient Summary of Call: patient called and left voice message stating after he spoke with me regarding the lab test, he wanted to know what the next step is to help resolve his issue with his stomach. His message states he was not sure if he needs to schedule another follow up with Dr Artist Pais Initial call taken by: Glendell Docker CMA,  Oct 05, 2009 10:46 AM  Follow-up for Phone Call        he was instructed to follow up within 1 month from last OV. has he tried gluten free diet?  Follow-up by: D. Thomos Lemons DO,  Oct 05, 2009 11:07 AM  Additional Follow-up for Phone Call Additional follow up Details #1::        patient states that he has tried the Gluten Free diet since the last office visit and he has not noticed any difference. Additional Follow-up by: Glendell Docker CMA,  Oct 05, 2009 11:25 AM    Additional Follow-up for Phone Call Additional follow up Details #2::    I suggest referral to GI - see orders. pt should still schedule 1 month f/u with me Follow-up by: D. Thomos Lemons DO,  Oct 05, 2009 12:20 PM  Additional Follow-up for Phone Call Additional follow up Details #3:: Details for Additional Follow-up Action Taken: Notified pt Myriam Jacobson would contact him with GI appt. date/time. Pt declined to schedule 1 month f/u at this time due to uncertainty with his work schedule. He states he will call back at the end of this month to schedule appt. Mervin Kung CMA  Oct 05, 2009 4:25 PM

## 2010-06-29 NOTE — Letter (Signed)
Summary: [facility]  [facility]   Imported By: Lanelle Bal 12/15/2009 09:17:48  _____________________________________________________________________  External Attachment:    Type:   Image     Comment:   External Document

## 2010-06-29 NOTE — Letter (Signed)
Summary: [facility] Instructions  [facility]  [address] [place], [place] [address]   Phone: [phone]  Fax: [phone]       DAIQUAN RESNIK    12/12/1970    MRN: [id number]        Procedure Day Dorna Bloom:  [facility]  11/11/09     Arrival Time:  3:00pm     Procedure Time:  4:00pm     Location of Procedure:                    _X _  [facility] (4th Floor)                        PREPARATION FOR COLONOSCOPY WITH MOVIPREP   Starting 5 days prior to your procedure  FRIDAY 11/06/09 do not eat nuts, seeds, popcorn, corn, beans, peas,  salads, or any raw vegetables.  Do not take any fiber supplements (e.g. Metamucil, Citrucel, and Benefiber).  THE DAY BEFORE YOUR PROCEDURE         DATE:  TUESDAY 11/10/09  1.  Drink clear liquids the entire day-NO SOLID FOOD  2.  Do not drink anything colored red or purple.  Avoid juices with pulp.  No orange juice.  3.  Drink at least 64 oz. (8 glasses) of fluid/clear liquids during the day to prevent dehydration and help the prep work efficiently.  CLEAR LIQUIDS INCLUDE: Water Jello Ice Popsicles Tea (sugar ok, no milk/cream) Powdered fruit flavored drinks Coffee (sugar ok, no milk/cream) Gatorade Juice: apple, white grape, white cranberry  Lemonade Clear bullion, consomm, broth Carbonated beverages (any kind) Strained chicken noodle soup Hard Candy                             4.  In the morning, mix first dose of MoviPrep solution:    Empty 1 Pouch A and 1 Pouch B into the disposable container    Add lukewarm drinking water to the top line of the container. Mix to dissolve    Refrigerate (mixed solution should be used within 24 hrs)  5.  Begin drinking the prep at 5:00 p.m. The MoviPrep container is divided by 4 marks.   Every 15 minutes drink the solution down to the next mark (approximately 8 oz) until the full liter is complete.   6.  Follow completed prep with 16 oz of clear liquid of your  choice (Nothing red or purple).  Continue to drink clear liquids until bedtime.  7.  Before going to bed, mix second dose of MoviPrep solution:    Empty 1 Pouch A and 1 Pouch B into the disposable container    Add lukewarm drinking water to the top line of the container. Mix to dissolve    Refrigerate  THE DAY OF YOUR PROCEDURE      DATE: [facility]  11/11/09  Beginning at  11:00 a.m. (5 hours before procedure):         1. Every 15 minutes, drink the solution down to the next mark (approx 8 oz) until the full liter is complete.  2. Follow completed prep with 16 oz. of clear liquid of your choice.    3. You may drink clear liquids until  2:00PM  (2 HOURS BEFORE PROCEDURE).   MEDICATION INSTRUCTIONS  Unless otherwise instructed, you should take regular prescription medications with a small sip of water   as early  as possible the morning of your procedure.        OTHER INSTRUCTIONS  You will need a responsible adult at least 41 years of age to accompany you and drive you home.   This person must remain in the waiting room during your procedure.  Wear loose fitting clothing that is easily removed.  Leave jewelry and other valuables at home.  However, you may wish to bring a book to read or  an iPod/MP3 player to listen to music as you wait for your procedure to start.  Remove all body piercing jewelry and leave at home.  Total time from sign-in until discharge is approximately 2-3 hours.  You should go home directly after your procedure and rest.  You can resume normal activities the  day after your procedure.  The day of your procedure you should not:   Drive   Make legal decisions   Operate machinery   Drink alcohol   Return to work  You will receive specific instructions about eating, activities and medications before you leave.    The above instructions have been reviewed and explained to me by  Wyona Almas RN  October 30, 2009 11:35 AM     I fully  understand and can verbalize these instructions _____________________________ Date _________

## 2010-06-29 NOTE — Progress Notes (Signed)
Summary: Dr Leory Plowman [facility]'s office faxing Colonoscopy from 06.   Phone Note Outgoing Call   Call placed by: Joselyn Glassman,  Oct 19, 2009 2:01 PM Call placed to: Specialist Summary of Call: I spoke to Trish at Dr. Leory Plowman Re[facility]'s office in [place], [place].  The pt did have a Colonoscopy on 11-16-2004.  I asked if she would fax the report and the pathology and she said she would. Initial call taken by: Joselyn Glassman,  Oct 19, 2009 2:07 PM

## 2010-06-29 NOTE — Progress Notes (Signed)
Summary: Lab Results  Phone Note Outgoing Call   Summary of Call: call pt - celiac panel blood test is normal.   I will send lab letter Initial call taken by: D. Thomos Lemons DO,  Oct 04, 2009 7:59 AM  Follow-up for Phone Call        patient advised per Dr Artist Pais instructions. He asked about resuming normal lifestyle, he was informed that it would okay. Follow-up by: Glendell Docker CMA,  Oct 05, 2009 9:15 AM

## 2010-06-29 NOTE — Consult Note (Signed)
Summary: [facility]  [facility]   Imported By: Lanelle Bal 09/28/2009 09:56:15  _____________________________________________________________________  External Attachment:    Type:   Image     Comment:   External Document

## 2010-06-29 NOTE — Letter (Signed)
   [place] [facility] [address] [place], [place]  [address]  [place] Phone: [phone]      Oct 04, 2009   YORDI KRAGER [address] [place], [place] [address]  RE:  LAB RESULTS  Dear  Mr. Soltys,  The following is an interpretation of your most recent lab tests.  Please take note of any instructions provided or changes to medications that have resulted from your lab work.  ELECTROLYTES:  Good - no changes needed  KIDNEY FUNCTION TESTS:  Good - no changes needed  LIVER FUNCTION TESTS:  Stable - no changes needed    CBC:  Stable - no changes needed  Lipase (pancreatic enzyme) - normal Celiac sprue panel - negative       Sincerely Yours,    Dr. Thomos Lemons

## 2010-06-29 NOTE — Assessment & Plan Note (Signed)
Summary: 1 month follow up/mhf--Rm 2   Vital Signs:  Patient profile:   40 year old male Height:      74 inches Weight:      194.25 pounds BMI:     25.03 Temp:     98.3 degrees F oral Pulse rate:   66 / minute Pulse rhythm:   regular Resp:     66 per minute BP sitting:   126 / 76  (right arm) Cuff size:   regular  Vitals Entered By: Mervin Kung CMA (AAMA) (November 27, 2009 3:00 PM) CC: Room 2  1 month follow up. Is Patient Diabetic? No Comments Pt has completed Moviprep.   Primary Care Provider:  Dondra Spry DO  CC:  Room 2  1 month follow up.Marland Kitchen  History of Present Illness: 40 y/o white male for f/u  loose stools much better he is avoiding fatty foods - stromboli, ice cream he is using probiotic  ADD - hasn't tried vyvanse yet.  working on football story for work.  taking longer than needed due to inability to focus  Allergies (verified): No Known Drug Allergies  Past History:  Past Medical History: History of concussions History of prostatitis      Depression      External hemorrhoids Hx of rectal bleeding - negative colonoscopy 2005-2006  Chronic Headaches  Irritable Bowel Syndrome  Past Surgical History: colonoscopy 2005-2006 ([facility])   Hernia Surgery Deviated septim  Detached Retina  Family History: Family History Hypertension (father) Family History Diabetes 1st degree relative (father) Family History High cholesterol Depression - PGF (committed suicide) Colon ca - no Prostate ca - MGF Hypothyroidism   No family of IBD            Social History: Married (18 months) No children Never Smoked   Alcohol use-yes     Regular exercise-yes         Physical Exam  General:  alert, well-developed, and well-nourished.   Lungs:  normal respiratory effort and normal breath sounds.   Heart:  normal rate, regular rhythm, and no gallop.   Extremities:  . Psych:  normally interactive, good eye contact, not anxious appearing, and not depressed  appearing.     Impression & Recommendations:  Problem # 1:  LOOSE STOOLS (ICD-787.91) Probable IBS.  better with probiotic and avoidance of fatty foods  Colonoscopy showed:  1) Normal terminal ileum (no Crohn's)     2) Mild diverticulosis in the sigmoid to descending colon     segments     3) Otherwise normal examination; random biopsies taken to check     for microscopic colitis  1. COLON, BIOPSY, RANDOM :    - BENIGN COLONIC MUCOSA. - no significant inflammation or other abnormalities identified.    Problem # 2:  ADD (ICD-314.00) He hasn't started vyvanse.  pt to start at 20 mg.  If effects wear off by early afternoon.  pt to titrate to 40 mg.  reassess in 1 month  Complete Medication List: 1)  Travatan 0.004 % Soln (Travoprost) .... One drop each eye once daily 2)  Digestive Advantage Intensive Bowel Support  .... Take 1 capsule by mouth once a day 3)  Vyvanse 20 Mg Caps (Lisdexamfetamine dimesylate) .... One by mouth once daily  Patient Instructions: 1)  As you stop omeprazole use over the counter zantac 150 mg two times a day  x 1-2 weeks.  Current Allergies (reviewed today): No known allergies

## 2010-06-29 NOTE — Assessment & Plan Note (Signed)
Summary: fu visit/dt   Vital Signs:  Patient profile:   40 year old male Height:      74 inches Weight:      190 pounds BMI:     24.48 O2 Sat:      97 % on Room air Temp:     97.7 degrees F oral Pulse rate:   65 / minute BP sitting:   130 / 80  (right arm) Cuff size:   regular  Vitals Entered By: Lucious Groves (September 25, 2009 3:01 PM)  O2 Flow:  Room air Is Patient Diabetic? No Pain Assessment Patient in pain? no        Primary Care Provider:  Dondra Spry DO   History of Present Illness: 40 y/o white male for f/u.  still having ongoing issues with loose stools/freq BMs.  no improvement with lactose free diet. non localized abd pain.  no fever or chills  increased stress at work, more demanding work schedule again ongoing issues with poor focus.  supervisor needs to repeat instruction several times  Current Medications (verified): 1)  Travatan 0.004 % Soln (Travoprost) .... One Drop Each Eye Once Daily 2)  Hydrocortisone Ace-Pramoxine 2.5-1 % Crea (Hydrocortisone Ace-Pramoxine) .... Apply Two Times A Day X 7 Days  Allergies (verified): No Known Drug Allergies  Past History:  Past Medical History: History of concussions History of prostatitis      Depression      External hemorrhoids Hx of rectal bleeding - negative colonoscopy 2005-2006   Past Surgical History: colonoscopy 2005-2006 ([facility])    Family History: Family History Hypertension (father) Family History Diabetes 1st degree relative (father) Family History High cholesterol Depression - PGF (committed suicide) Colon ca - no Prostate ca - MGF Hypothyroidism   No family of IBD           Social History: Married (18 months) No children Never Smoked   Alcohol use-yes    Regular exercise-yes         Physical Exam  General:  alert, well-developed, and well-nourished.   Lungs:  normal respiratory effort and normal breath sounds.   Heart:  normal rate, regular rhythm, no murmur, and no  gallop.   Abdomen:  mild epigastric tenderness,  soft, normal bowel sounds, no masses, no guarding, no rigidity, no hepatomegaly, and no splenomegaly.   Extremities:  No lower extremity edema    Impression & Recommendations:  Problem # 1:  ABDOMINAL PAIN, GENERALIZED (ICD-789.07) 40 y/o with intermittent loose stools and abd pain.  consider celiac sprue.  probable IBS check lab.  pt will try gluten free diet x 2-4 weeks.  empiric PPI   Orders: T-Basic Metabolic Panel [id number]) T-Hepatic Function [phone]) T-CBC w/Diff [id number]) T-Lipase [id number]) T-Sed Rate (Automated) [id number]) T- * Misc. Laboratory test [id number])  Problem # 2:  ADD (ICD-314.00) [facility] prev [facility] by [facility] and diagnosed with depression and ADD inattenttive type.  he could not tolerate wellbutrin due to irritability.  trial of vyvanse.  we discussed potential side effects  Complete Medication List: 1)  Travatan 0.004 % Soln (Travoprost) .... One drop each eye once daily 2)  Hydrocortisone Ace-pramoxine 2.5-1 % Crea (Hydrocortisone ace-pramoxine) .... Apply two times a day x 7 days 3)  Vyvanse 20 Mg Caps (Lisdexamfetamine dimesylate) .... One by mouth qd 4)  Hyoscyamine Sulfate 0.125 Mg Tabs (Hyoscyamine sulfate) .... One by mouth three times a day as needed for abd pain 5)  Omeprazole 20 Mg Cpdr (Omeprazole) .Marland KitchenMarland KitchenMarland Kitchen  One by mouth once daily  Patient Instructions: 1)  Please schedule a follow-up appointment in 1 month. 2)  Follow gluten free diet x 1 month Prescriptions: OMEPRAZOLE 20 MG CPDR (OMEPRAZOLE) one by mouth once daily  #30 x 3   Entered and Authorized by:   D. Thomos Lemons DO   Signed by:   D. Thomos Lemons DO on 09/25/2009   Method used:   Electronically to        [facility] [address] (retail)       [address] [place], [place]  [address]       Ph: [phone] or [phone]       Fax: [phone]   RxID:   [id number] HYOSCYAMINE SULFATE 0.125 MG  TABS (HYOSCYAMINE SULFATE) one by mouth three times a day as needed for abd pain  #30 x 3   Entered and Authorized by:   D. Thomos Lemons DO   Signed by:   D. Thomos Lemons DO on 09/25/2009   Method used:   Electronically to        [facility] [address] (retail)       [address] [place], [place]  [address]       Ph: [phone] or [phone]       Fax: [phone]   RxID:   [id number] VYVANSE 20 MG CAPS (LISDEXAMFETAMINE DIMESYLATE) one by mouth qd  #30 x 0   Entered and Authorized by:   D. Thomos Lemons DO   Signed by:   D. Thomos Lemons DO on 09/25/2009   Method used:   Print then Give to Patient   RxID:   [id number]

## 2010-06-29 NOTE — Miscellaneous (Signed)
Summary: [facility] Previsit/prep  Clinical Lists Changes  Medications: Added new medication of MOVIPREP 100 GM  SOLR (PEG-KCL-NACL-NASULF-NA ASC-C) As per prep instructions. - Signed Rx of MOVIPREP 100 GM  SOLR (PEG-KCL-NACL-NASULF-NA ASC-C) As per prep instructions.;  #1 x 0;  Signed;  Entered by: Wyona Almas RN;  Authorized by: Rachael Fee MD;  Method used: Faxed to [facility] [address], [address] [place], [place]  [address], Ph: [phone], Fax: [phone] Observations: Added new observation of NKA: T (10/30/2009 10:51)    Prescriptions: MOVIPREP 100 GM  SOLR (PEG-KCL-NACL-NASULF-NA ASC-C) As per prep instructions.  #1 x 0   Entered by:   Wyona Almas RN   Authorized by:   Rachael Fee MD   Signed by:   Wyona Almas RN on 10/30/2009   Method used:   Faxed to ...       [facility] [address] (retail)       [address]       [place], [place]  [address]       Ph: [phone]       Fax: [phone]   RxID:   [id number]

## 2010-06-29 NOTE — Letter (Signed)
Summary: [facility]  [facility]   Imported By: Sherian Rein 10/23/2009 14:33:57  _____________________________________________________________________  External Attachment:    Type:   Image     Comment:   External Document

## 2010-06-29 NOTE — Progress Notes (Signed)
Summary: New Pt / Abd Pain- Doc of the Day  Phone Note From Other Clinic   Caller: [facility] @ Dr Artist Pais [phone] Call For: Dr Jarold Motto (Doc of the Day) Reason for Call: Schedule Patient Appt Summary of Call: Abd Pain would like pt seen sooner than next available. Initial call taken by: Leanor Kail [facility],  Oct 06, 2009 8:59 AM  Follow-up for Phone Call        Offered appt this week with Np,  Pt out ot town this week.  Appt sch with Amy Esterwood on 10/13/09. Helen notified.  she will notify pt. Follow-up by: Ashok Cordia RN,  Oct 06, 2009 9:43 AM

## 2010-06-29 NOTE — Progress Notes (Signed)
Summary: Lab Results  Phone Note Call from Patient Call back at Home Phone [phone]   Caller: Patient Call For: D. Thomos Lemons DO Reason for Call: Lab or Test Results Summary of Call: Patient called and left voice message requesting results from lab testing Initial call taken by: Glendell Docker CMA,  Sep 30, 2009 3:37 PM  Follow-up for Phone Call        we are still waiting for final results from [facility].   darlene, plz call [facility] and check status of celiac sprue panel Follow-up by: D. Thomos Lemons DO,  Oct 01, 2009 10:20 AM  Additional Follow-up for Phone Call Additional follow up Details #1::        spoke with Selena Batten at [facility] and she states there was a problem with the equipment and lab results are being processes, however she is not sure when the results will be available. Call was placed to patient and he has been advised that results are still pending and he would receive a call when test are complete Additional Follow-up by: Glendell Docker CMA,  Oct 01, 2009 3:33 PM

## 2010-06-29 NOTE — Procedures (Signed)
Summary: Colonoscopy/[facility]  Colonoscopy/[facility]   Imported By: Sherian Rein 10/23/2009 14:31:05  _____________________________________________________________________  External Attachment:    Type:   Image     Comment:   External Document

## 2010-07-01 NOTE — Letter (Signed)
Summary: [facility]  [facility]   Imported By: Lanelle Bal 05/19/2010 11:28:28  _____________________________________________________________________  External Attachment:    Type:   Image     Comment:   External Document

## 2010-07-01 NOTE — Miscellaneous (Signed)
Summary: Consent to Special Procedure  Consent to Special Procedure   Imported By: Lanelle Bal 05/19/2010 13:06:32  _____________________________________________________________________  External Attachment:    Type:   Image     Comment:   External Document

## 2010-07-01 NOTE — Consult Note (Signed)
Summary: [facility] @ [facility] [place]   Imported By: Lanelle Bal 05/28/2010 09:36:06  _____________________________________________________________________  External Attachment:    Type:   Image     Comment:   External Document

## 2010-07-01 NOTE — Progress Notes (Signed)
Summary: biopsy, u/s  result  Phone Note Outgoing Call   Summary of Call: call pt - skin biopsies negative for cancer.  diagnosis - dysplastic nevus I suggest pt be monitored by dermatologist  also inform pt - liver u/s was normal Initial call taken by: D. Thomos Lemons DO,  May 13, 2010 1:55 PM  Follow-up for Phone Call        Pt notified. Nicki Guadalajara Fergerson CMA Duncan Dull)  May 13, 2010 4:52 PM

## 2010-07-01 NOTE — Assessment & Plan Note (Signed)
Summary: cpx/mhf--Rm 3   Vital Signs:  Patient profile:   40 year old [place] Height:      74 inches Weight:      197 pounds BMI:     25.38 Temp:     97.9 degrees F oral Pulse rate:   78 / minute Pulse rhythm:   regular Resp:     16 per minute BP sitting:   110 / 70  (right arm) Cuff size:   regular  Vitals Entered By: Mervin Kung CMA Duncan Dull) (May 06, 2010 2:26 PM) CC: Pt here for physical, non-fasting. Is Patient Diabetic? No Pain Assessment Patient in pain? no        Primary Care Provider:  Dondra Spry DO  CC:  Pt here for physical and non-fasting.Marland Kitchen  History of Present Illness: 40 y/o white [place] for routine cpx  pt seen by ortho re:  chronic foot issues pt complains he has been experiencing numbness and tingling in his extremities ( hands and feet ) x 4-6 weeks no know trigger no new meds  ADD - tried vyvanse.  only took 2-3 doses.  stopped because it made him feel oddly and caused chest pain    Preventive Screening-Counseling & Management  Alcohol-Tobacco     Alcohol drinks/day: <1 <1     Smoking Status: never  Caffeine-Diet-Exercise     Caffeine use/day: <1daily     Does Patient Exercise: no  Allergies (verified): 1)  ! Vyvanse (Lisdexamfetamine Dimesylate)  Past History:  Past Medical History: History of concussions History of prostatitis      Depression      External hemorrhoids Hx of rectal bleeding - negative colonoscopy 2005-2006  Chronic Headaches  Irritable Bowel Syndrome Morton's Neuroma, right foot-- 2010  Social History: Caffeine use/day:  <1daily Does Patient Exercise:  no  Review of Systems  The patient denies fever, weight loss, weight gain, chest pain, dyspnea on exertion, headaches, abdominal pain, melena, hematochezia, and severe indigestion/heartburn.   Neuro:  Complains of numbness; denies headaches, poor balance, and visual disturbances; no change in speech no change in coordination.  Physical Exam  General:   alert, well-developed, and well-nourished.   Head:  normocephalic and atraumatic.   Eyes:  pupils equal, pupils round, and pupils reactive to light.   Ears:  R ear normal and L ear normal.   Nose:  no nasal discharge.   Mouth:  good dentition and pharynx pink and moist.   Neck:  No deformities, masses, or tenderness noted. Lungs:  Normal respiratory effort, chest expands symmetrically. Lungs are clear to auscultation, no crackles or wheezes. Heart:  Normal rate and regular rhythm. S1 and S2 normal without gallop, murmur, click, rub or other extra sounds. Abdomen:  soft, non-tender, normal bowel sounds, and no masses.   Extremities:  No lower extremity edema  Neurologic:  alert & oriented X3, cranial nerves II-XII intact, strength normal in all extremities, gait normal, and DTRs symmetrical and normal.  decrease in sensation to vibration in lower ext.  less  in hands and wrists   Impression & Recommendations:  Problem # 1:  HEALTH MAINTENANCE EXAM (ICD-V70.0) Reviewed adult health maintenance protocols.  Colonoscopy: DONE (11/11/2009) Flu Vax: Historical (03/19/2010)   Chol: 198 (07/09/2008)   HDL: 49.5 (07/09/2008)   LDL: 123 (07/09/2008)   TG: 127 (07/09/2008) TSH: 1.23 (07/09/2008)   Next Colonoscopy due:: 10/2020 (11/18/2009)  Problem # 2:  POLYNEUROPATHY (ICD-357.9) unexplained numbness and tingling.  decreased vibration sense of LE.  check labs refer to neuro for further evaluation Orders: T-Basic Metabolic Panel [id number]) T-Hepatic Function [id number]) T- Hemoglobin A1C [phone]) T-Vitamin B12 [id number]) T-TSH [id number]) T- * Misc. Laboratory test [id number]) T- * Misc. Laboratory test [id number]) Neurology Referral (Neuro)  Complete Medication List: 1)  Travatan 0.004 % Soln (Travoprost) .... One drop each eye once daily 2)  Digestive Advantage Intensive Bowel Support  .... Take 1 capsule by mouth once a day 3)  Timentin 3.1 Gm/153ml Soln (Ticarcillin-pot  clavulanate) .... Left eye once daily 4)  Levbid 0.375 Mg Tb12 (Hyoscyamine sulfate) .... Take one pill two times a day 5)  Mupirocin 2 % Oint (Mupirocin) .... Apply two times a day to nares x 2 weeks  Patient Instructions: 1)  Our office will contact you re:  neurology referral 2)  Please schedule a follow-up appointment in 1 month after neurology consultation   Orders Added: 1)  T-Basic Metabolic Panel [[id number]] 2)  T-Hepatic Function [[id number]] 3)  T- Hemoglobin A1C [[id number]] 4)  T-Vitamin B12 [[id number]] 5)  T-TSH [[id number]] 6)  T- * Misc. Laboratory test [[id number]] 7)  T- * Misc. Laboratory test [phone] 8)  Neurology Referral [Neuro] 9)  Est. Patient 40-64 years [99396]   Immunization History:  Influenza Immunization History:    Influenza:  historical (03/19/2010)   Immunization History:  Influenza Immunization History:    Influenza:  Historical (03/19/2010)  Current Allergies (reviewed today): ! VYVANSE (LISDEXAMFETAMINE DIMESYLATE)   Preventive Care Screening     Pt doesn't know if he's ever had a tetanus shot.  Nicki Guadalajara Fergerson CMA Duncan Dull)  May 06, 2010 2:34 PM

## 2010-07-01 NOTE — Letter (Signed)
   [place] [facility] [address] [place], [place]  [address]  [place] Phone: [phone]      May 10, 2010   ZYIER DYKEMA [address] [place], [place] [address]  RE:  LAB RESULTS  Dear  Mr. Whitby,  The following is an interpretation of your most recent lab tests.  Please take note of any instructions provided or changes to medications that have resulted from your lab work.  ELECTROLYTES:  Good - no changes needed  KIDNEY FUNCTION TESTS:  Fair - review at your next visit  LIVER FUNCTION TESTS:  Stable - no changes needed   THYROID STUDIES:  Thyroid studies normal TSH: 1.664     DIABETIC STUDIES:  Excellent - no changes needed Blood Glucose: 93   HgbA1C: 5.3     B12 level - normal  Heavy metal screen - negative       Sincerely Yours,    Dr. Thomos Lemons  Appended Document:  Mailed.

## 2010-07-01 NOTE — Assessment & Plan Note (Signed)
Summary: skin tag removal/mhf   Vital Signs:  Patient profile:   40 year old male O2 Sat:      99 % on Room air Temp:     97.9 degrees F oral Pulse rate:   73 / minute Resp:     16 per minute BP sitting:   108 / 70  (right arm) Cuff size:   large  Vitals Entered By: Glendell Docker CMA (May 11, 2010 2:27 PM)  O2 Flow:  Room air CC: Skin tag removal Is Patient Diabetic? No Pain Assessment Patient in pain? no        Primary Care Provider:  Dondra Spry DO  CC:  Skin tag removal.  History of Present Illness: 40 y/o white male c/o bothersome skin tag of right axilla he also has multple moles on chest  question 2 on right upper question getting larger in size  reviewed LFT results    Allergies: 1)  ! Vyvanse (Lisdexamfetamine Dimesylate)  Past History:  Past Medical History: History of concussions History of prostatitis      Depression        External hemorrhoids Hx of rectal bleeding - negative colonoscopy 2005-2006  Chronic Headaches  Irritable Bowel Syndrome  Past Surgical History: colonoscopy 2005-2006 ([facility])   Hernia Surgery Deviated septim   Detached Retina   Family History: Family History Hypertension (father) Family History Diabetes 1st degree relative (father) Family History High cholesterol Depression - PGF (committed suicide) Colon ca - no Prostate ca - MGF Hypothyroidism   No family of IBD              Social History: Married (18 months) No children Never Smoked   Alcohol use-yes      Regular exercise-yes          Physical Exam  General:  alert, well-developed, and well-nourished.   Lungs:  normal respiratory effort and normal breath sounds.   Heart:  normal rate, regular rhythm, and no gallop.   Skin:  4 mm flat hyperpigmented lesion right chest, 3 mm flat hyperpigmented lesion right upper chest  4 mm pedculated lesion right axilla   Impression & Recommendations:  Problem # 1:  DYSPLASTIC NEVUS  (ICD-216.9) Assessment New  pt c/o enlarging nevus on chest x 2 shave biopsy performed under local anesthetic (1% lidocaine with epi used)  pt also has bothersome skin tag on right axilla removed lesion is similar fashion  areas cauterized - minimal oozing aftercare discussed  specimens sent to pathology  Orders: Shave Skin Lesion 0.6-1.0 cm/trunk/arm/leg (11301) Shave Skin Lesion < 0.5 cm/trunk/arm/leg (11300)  Problem # 2:  OTHER NONSPECIFIC ABNORMAL SERUM ENZYME LEVELS (ICD-790.5) mild elevation in bilirubin  probable gilberts syndrome check liver u/s  Orders: Ultrasound (Ultrasound)  Problem # 3:  POLYNEUROPATHY (ICD-357.9) appt with neuro scheduled this Friday B12,  TSH, HgA1c, heavy metal screen - negative  Complete Medication List: 1)  Travatan 0.004 % Soln (Travoprost) .... One drop each eye once daily 2)  Digestive Advantage Intensive Bowel Support  .... Take 1 capsule by mouth once a day 3)  Timentin 3.1 Gm/168ml Soln (Ticarcillin-pot clavulanate) .... Left eye once daily 4)  Levbid 0.375 Mg Tb12 (Hyoscyamine sulfate) .... Take one pill two times a day 5)  Mupirocin 2 % Oint (Mupirocin) .... Apply two times a day to nares x 2 weeks Prescriptions: MUPIROCIN 2 % OINT (MUPIROCIN) apply two times a day to nares x 2 weeks  #30 grams x 1  Entered and Authorized by:   D. Thomos Lemons DO   Signed by:   D. Thomos Lemons DO on 05/11/2010   Method used:   Electronically to        [facility] (retail)       [address]       [place], [place]  [address]       Ph: [phone]       Fax: [phone]   RxID:   [id number]    Orders Added: 1)  Ultrasound [Ultrasound] 2)  Est. Patient Level III [[address]] 3)  Shave Skin Lesion 0.6-1.0 cm/trunk/arm/leg [11301] 4)  Shave Skin Lesion < 0.5 cm/trunk/arm/leg [11300]    Current Allergies (reviewed today): No known allergies

## 2010-07-13 ENCOUNTER — Encounter: Payer: Self-pay | Admitting: Internal Medicine

## 2010-08-05 NOTE — Consult Note (Signed)
Summary: [facility]   Imported By: Maryln Gottron 07/28/2010 15:05:34  _____________________________________________________________________  External Attachment:    Type:   Image     Comment:   External Document

## 2010-10-12 NOTE — Op Note (Signed)
NAME:  Isaiah Cordova, Isaiah Cordova NO.:  [id number]   MEDICAL RECORD NO.:  [id number]          PATIENT TYPE:  AMB   LOCATION:  DFTL                         FACILITY:  [facility]   PHYSICIAN:  Donnel Saxon, MD    DATE OF BIRTH:  1970/08/03   DATE OF PROCEDURE:  01/04/2008  DATE OF DISCHARGE:  01/04/2008                               OPERATIVE REPORT   SURGEON:  Donnel Saxon, MD   ANESTHESIA:  General with endotracheal intubation.   PREOPERATIVE DIAGNOSIS:  Retinal detachment, right eye.   POSTOPERATIVE DIAGNOSIS:  Retinal detachment, right eye.   OPERATION/PROCEDURE:  Scleral buckle, right eye.   COMPLICATIONS:  None.   DESCRIPTION OF PROCEDURE:  The patient was identified in the  preoperative holding area.  He was then taken to the operating room  where he was placed under general anesthesia.  At that point in time,  the right eye was prepped and draped for usual sterile fashion including  trimming of lashes.  A Lieberman speculum was placed between the right  upper and lower eyelids.  A conjunctival peritomy was then performed  with 0.3 forceps and Westcott scissors to prepare the eye for a standard  scleral buckle procedure.  Stevens tenotomy scissors were then used to  dissect this tenon's capsule off the sclera in all 4 quadrants and the 4  recti muscles were isolated using 2-0 silk suture.  The eye was then  inspected with indirect ophthalmoscopy.  The retinal break was  identified at 57 o' clock and was surrounded by confluent cryotherapy.  Following this, the retinal break was marked on the external sclera  using a surgical pin.  Next, horizontal mattress sutures were placed  using 5-0 nylon sutures in the oblique quadrants to support a #220  scleral buckle, 240-band, and a 270-sleeve.  The eye was then encircled  with a #240 scleral belt.  The ends of the belt were brought in the  inferior nasal quadrant and joined with a 270 sleeve.  The 220 buckle  was  trimmed and placed in the superior hemisphere supporting the retinal  break.  The nylon sutures were tied and the knots rotated posteriorly.  The eye was then injected with a 0.3 mL volume of 100% C3F8 intraocular  gas.  The intraocular pressure was assessed and found to be elevated.  Thus, approximately 0.2 mL of clear aqueous fluid were removed from the  anterior chamber of the eye using a 30-gauge needle to create a  paracentesis.  The fluid was removed without difficulty.  Intraocular  pressure was reassessed and found to be physiologic.  The eye was then  inspected and found to have excellent retinal break.  The tenon's  capsule and conjunctiva were then reapproximated to the limbus using 7-0  Vicryl suture.  The 4 recti muscles were freed with a 2-0 silk suture.  The eye was then treated with subconjunctival injections of 4 mg of  dexamethasone.  The eye was then treated topically with Ancef 25  mg and 1 drop of 1% atropine and TobraDex ointment.  The speculum was  removed  and eyelids were cleaned and closed.  They were then patched and  shielded.  The patient was then taken to recovery in excellent condition  having tolerated the procedure well.      Donnel Saxon, MD  Electronically Signed     JBS/MEDQ  D:  01/07/2008  T:  01/08/2008  Job:  [id number]

## 2010-10-14 ENCOUNTER — Encounter: Payer: Self-pay | Admitting: Internal Medicine

## 2011-02-25 LAB — CBC
HCT: 42.2
MCHC: 33.6
Platelets: 233
RDW: 12.5

## 2011-03-23 ENCOUNTER — Encounter: Payer: Self-pay | Admitting: Internal Medicine

## 2011-03-23 ENCOUNTER — Ambulatory Visit (INDEPENDENT_AMBULATORY_CARE_PROVIDER_SITE_OTHER): Payer: Self-pay | Admitting: Internal Medicine

## 2011-03-23 DIAGNOSIS — R238 Other skin changes: Secondary | ICD-10-CM

## 2011-03-23 DIAGNOSIS — R197 Diarrhea, unspecified: Secondary | ICD-10-CM

## 2011-03-23 DIAGNOSIS — J029 Acute pharyngitis, unspecified: Secondary | ICD-10-CM

## 2011-03-23 LAB — CBC WITH DIFFERENTIAL/PLATELET
Basophils Absolute: 0 10*3/uL (ref 0.0–0.1)
Basophils Relative: 1 % (ref 0–1)
Hemoglobin: 14.3 g/dL (ref 13.0–17.0)
MCHC: 33.6 g/dL (ref 30.0–36.0)
Monocytes Relative: 10 % (ref 3–12)
Neutro Abs: 4 10*3/uL (ref 1.7–7.7)
Neutrophils Relative %: 71 % (ref 43–77)
Platelets: 246 10*3/uL (ref 150–400)

## 2011-03-23 LAB — BASIC METABOLIC PANEL
Calcium: 9.5 mg/dL (ref 8.4–10.5)
Glucose, Bld: 82 mg/dL (ref 70–99)
Potassium: 4.3 mEq/L (ref 3.5–5.3)
Sodium: 141 mEq/L (ref 135–145)

## 2011-03-23 LAB — POCT RAPID STREP A (OFFICE): Rapid Strep A Screen: NEGATIVE

## 2011-03-23 MED ORDER — CIPROFLOXACIN HCL 500 MG PO TABS: 500.0000 mg | ORAL_TABLET | Freq: Two times a day (BID) | ORAL | Status: AC

## 2011-03-27 DIAGNOSIS — J029 Acute pharyngitis, unspecified: Secondary | ICD-10-CM | POA: Insufficient documentation

## 2011-03-27 DIAGNOSIS — R238 Other skin changes: Secondary | ICD-10-CM | POA: Insufficient documentation

## 2011-03-27 NOTE — Assessment & Plan Note (Signed)
Rapid strep obtained and neg. tx sx with otc prn medication. Followup if no improvement or worsening.

## 2011-03-27 NOTE — Assessment & Plan Note (Signed)
Obtain cbc  

## 2011-03-27 NOTE — Assessment & Plan Note (Signed)
Obtain chem7. Begin brief empiric course of cipro. Followup if no improvement or worsening.

## 2011-03-27 NOTE — Progress Notes (Signed)
  Subjective:    Patient ID: Isaiah Cordova, male    DOB: [birth year], 40 y.o.   MRN: [id number]  HPI Pt presents to clinic for evaluation ofST. Notes two week h/o intermittent diarrhea without blood or associated abd pain. Taking immodium intermittently. Colonoscopy utd. No n/v. Also has recent ST, yellow nasal drainage, fatigue and mylagias. Onset of ST yesterday. No known strep exp. Also notes recent easy bruisability without gross active bleeding. No other complaints.  Past Medical History  Diagnosis Date  . Concussion   . Prostatitis   . Depression   . Hemorrhoids, external   . Rectal bleeding     history of, negative colonoscopy 2005-2006  . Headache     chronic  . Irritable bowel    Past Surgical History  Procedure Date  . Hernia repair   . Nasal septum surgery   . Retinal detachment surgery     reports that he has never smoked. He has never used smokeless tobacco. He reports that he drinks alcohol. His drug history not on file. family history includes Depression in his maternal grandfather and paternal grandfather; Diabetes in his father; Hyperlipidemia in his father; Hypertension in his father; and Hypothyroidism in his other. Allergies  Allergen Reactions  . Lisdexamfetamine     REACTION: chest pains       Review of Systems see hpi     Objective:   Physical Exam  Nursing note and vitals reviewed. Constitutional: He appears well-developed and well-nourished. No distress.  HENT:  Head: Normocephalic and atraumatic.  Right Ear: Tympanic membrane, external ear and ear canal normal.  Left Ear: Tympanic membrane, external ear and ear canal normal.  Nose: Nose normal.  Mouth/Throat: Uvula is midline and mucous membranes are normal. Posterior oropharyngeal erythema present. No oropharyngeal exudate, posterior oropharyngeal edema or tonsillar abscesses.  Eyes: Conjunctivae are normal. No scleral icterus.  Neck: Neck supple.  Cardiovascular: Normal rate, regular  rhythm and normal heart sounds.   Pulmonary/Chest: Effort normal and breath sounds normal.  Abdominal: Soft. Bowel sounds are normal. He exhibits no distension and no mass. There is no tenderness. There is no rebound and no guarding.  Lymphadenopathy:    He has no cervical adenopathy.  Neurological: He is alert.  Skin: Skin is warm and dry. No rash noted. He is not diaphoretic. No erythema.  Psychiatric: He has a normal mood and affect.          Assessment & Plan:

## 2011-07-12 ENCOUNTER — Ambulatory Visit (INDEPENDENT_AMBULATORY_CARE_PROVIDER_SITE_OTHER): Payer: BC Managed Care – PPO | Admitting: Internal Medicine

## 2011-07-12 DIAGNOSIS — Z23 Encounter for immunization: Secondary | ICD-10-CM

## 2011-10-28 ENCOUNTER — Ambulatory Visit (INDEPENDENT_AMBULATORY_CARE_PROVIDER_SITE_OTHER): Payer: BC Managed Care – PPO | Admitting: Internal Medicine

## 2011-10-28 ENCOUNTER — Encounter: Payer: Self-pay | Admitting: Internal Medicine

## 2011-10-28 VITALS — BP 120/80 | Temp 99.2°F | Wt 200.0 lb

## 2011-10-28 DIAGNOSIS — R209 Unspecified disturbances of skin sensation: Secondary | ICD-10-CM

## 2011-10-28 DIAGNOSIS — R202 Paresthesia of skin: Secondary | ICD-10-CM | POA: Insufficient documentation

## 2011-10-28 DIAGNOSIS — M2550 Pain in unspecified joint: Secondary | ICD-10-CM

## 2011-10-28 DIAGNOSIS — R6882 Decreased libido: Secondary | ICD-10-CM | POA: Insufficient documentation

## 2011-10-28 NOTE — Assessment & Plan Note (Signed)
Check testosterone levels. Obtain screening iron studies and ferritin level. Rule out hypogonadism.

## 2011-10-28 NOTE — Assessment & Plan Note (Addendum)
41 year old white male complains of tingling and pain in upper and lower extremities. He experienced similar symptoms in 2011 and underwent neurologic workup. We do not have records and patient does not recall results. Repeat thyroid studies, B12. Obtain sedimentation rate, ANA and rheumatoid factor.  Consider repeating MRI of brain and C-spine to rule out multiple sclerosis

## 2011-10-28 NOTE — Progress Notes (Signed)
Subjective:    Patient ID: Isaiah Cordova, male    DOB: 1970-12-16, 41 y.o.   MRN: [id number]  HPI  41 year old white male with history of mild chronic fatigue syndrome presents with progressive numbness and tingling in hands and feet. He describes arthralgias in both hands along with tingling. He also experiences a vibration sense in both eyes. He has also noticed a tingling sedation of his peroneal and the tip of his penis over the last one month. He has occasional floaters in his vision but denies double vision or blurry vision. He has noticed some clumsiness. He denies dropping objects with his hands.  Patient also complains of mild transient dizziness with standing.  He had similar symptoms in 2011/2012.  He was referred to neurologist.   He does not recall results of workup.   Review of Systems  Negative for double vision, positive for clumsiness Denies incontinence,  occasional urinary frequency  Past Medical History  Diagnosis Date  . Concussion   . Prostatitis   . Depression   . Hemorrhoids, external   . Rectal bleeding     history of, negative colonoscopy 2005-2006  . Headache     chronic  . Irritable bowel     History   Social History  . Marital Status: Married    Spouse Name: Andrey Campanile    Number of Children: 0  . Years of Education: N/A   Occupational History  . [employer]    Social History Main Topics  . Smoking status: Never Smoker   . Smokeless tobacco: Never Used  . Alcohol Use: Yes  . Drug Use: Not on file  . Sexually Active: Not on file   Other Topics Concern  . Not on file   Social History Narrative   Regular exercise - yes    Past Surgical History  Procedure Date  . Hernia repair   . Nasal septum surgery   . Retinal detachment surgery     Family History  Problem Relation Age of Onset  . Hypertension Father   . Diabetes Father   . Hyperlipidemia Father   . Depression Maternal Grandfather     prostate  . Depression  Paternal Grandfather     committed suicide  . Hypothyroidism Other     Allergies  Allergen Reactions  . Lisdexamfetamine     REACTION: chest pains    Current Outpatient Prescriptions on File Prior to Visit  Medication Sig Dispense Refill  . timolol (TIMOPTIC) 0.25 % ophthalmic solution Place 1 drop into the left eye daily.        . travoprost, benzalkonium, (TRAVATAN) 0.004 % ophthalmic solution Place 1 drop into both eyes at bedtime.          BP 120/80  Temp(Src) 99.2 F (37.3 C) (Oral)  Wt 200 lb (90.719 kg)       Objective:   Physical Exam  Constitutional: He is oriented to person, place, and time. He appears well-developed and well-nourished. No distress.  HENT:  Head: Normocephalic and atraumatic.  Right Ear: External ear normal.  Left Ear: External ear normal.  Mouth/Throat: Oropharynx is clear and moist.  Eyes: Conjunctivae and EOM are normal. Pupils are equal, round, and reactive to light.       No nystagmus, no defects in peripheral vision  Neck: Normal range of motion. Neck supple.  Cardiovascular: Normal rate, regular rhythm, normal heart sounds and intact distal pulses.   No murmur heard. Pulmonary/Chest: Effort normal and breath  sounds normal. He has no wheezes.  Abdominal: Soft. Bowel sounds are normal. He exhibits no mass. There is no tenderness.  Genitourinary: Penis normal.       Normal cremasteric reflex  Musculoskeletal: He exhibits no edema.  Lymphadenopathy:    He has no cervical adenopathy.  Neurological: He is alert and oriented to person, place, and time. No cranial nerve deficit. He exhibits normal muscle tone. Coordination normal.       Upper extremity reflexes 1 out of 4 bilaterally Patellar reflexes 2 out of 4 bilaterally Achilles 2 out of 4 bilaterally Normal sensation to temperature and vibration of lower extremities Able to discriminate between sharp and dull of lower extremities  Skin: Skin is warm and dry.  Psychiatric: He has a  normal mood and affect. His behavior is normal.       Assessment & Plan:

## 2011-10-29 LAB — BASIC METABOLIC PANEL
BUN: 12 mg/dL (ref 6–23)
CO2: 28 mEq/L (ref 19–32)
Calcium: 9.9 mg/dL (ref 8.4–10.5)
Creat: 0.81 mg/dL (ref 0.50–1.35)
Glucose, Bld: 91 mg/dL (ref 70–99)

## 2011-10-29 LAB — CBC WITH DIFFERENTIAL/PLATELET
Eosinophils Relative: 6 % — ABNORMAL HIGH (ref 0–5)
Lymphocytes Relative: 47 % — ABNORMAL HIGH (ref 12–46)
Lymphs Abs: 2.9 10*3/uL (ref 0.7–4.0)
MCV: 88.5 fL (ref 78.0–100.0)
Neutro Abs: 2.6 10*3/uL (ref 1.7–7.7)
Platelets: 248 10*3/uL (ref 150–400)
RBC: 4.95 MIL/uL (ref 4.22–5.81)
WBC: 6.3 10*3/uL (ref 4.0–10.5)

## 2011-10-29 LAB — FERRITIN: Ferritin: 203 ng/mL (ref 22–322)

## 2011-10-29 LAB — HEMOGLOBIN A1C
Hgb A1c MFr Bld: 5.2 % (ref <5.7)
Mean Plasma Glucose: 103 mg/dL (ref <117)

## 2011-10-29 LAB — IBC PANEL: UIBC: 311 ug/dL (ref 125–400)

## 2011-10-29 LAB — IRON: Iron: 56 ug/dL (ref 42–165)

## 2011-11-02 LAB — TESTOSTERONE, FREE, TOTAL, SHBG
Sex Hormone Binding: 40 nmol/L (ref 13–71)
Testosterone, Free: 35.3 pg/mL — ABNORMAL LOW (ref 47.0–244.0)
Testosterone-% Free: 1.7 % (ref 1.6–2.9)
Testosterone: 208.79 ng/dL — ABNORMAL LOW (ref 300–890)

## 2011-11-07 ENCOUNTER — Telehealth: Payer: Self-pay | Admitting: *Deleted

## 2011-11-07 NOTE — Telephone Encounter (Signed)
Phone note says pt was called already but lab results are normal.  We are still awaiting records from neurologist.  Arline Asp, plz check status of copy of office notes and study results.

## 2011-11-07 NOTE — Telephone Encounter (Signed)
Pt was wondering if you had received lab results yet

## 2011-11-08 NOTE — Telephone Encounter (Signed)
Call pt - I reviewed records from previous neurologic evaluation.  EMG was normal.  MRI of brain and all blood tests were normal.  I am not sure another neurologic evaluation would be helpful.  I suggest OV if he would like to consider gabapentin to help with symptom control.

## 2011-11-08 NOTE — Telephone Encounter (Signed)
Isaiah Cordova, could you call them.  This is the one that I faxed over and gave you the medical release form.

## 2011-11-08 NOTE — Telephone Encounter (Signed)
Left message for pt to call back  °

## 2011-11-08 NOTE — Telephone Encounter (Signed)
Records on your desk

## 2011-11-10 NOTE — Telephone Encounter (Signed)
Pt aware, appt scheduled. °

## 2011-11-15 ENCOUNTER — Ambulatory Visit (INDEPENDENT_AMBULATORY_CARE_PROVIDER_SITE_OTHER): Payer: BC Managed Care – PPO | Admitting: Internal Medicine

## 2011-11-15 ENCOUNTER — Encounter: Payer: Self-pay | Admitting: Internal Medicine

## 2011-11-15 VITALS — BP 116/74 | Temp 98.5°F | Wt 198.0 lb

## 2011-11-15 DIAGNOSIS — R202 Paresthesia of skin: Secondary | ICD-10-CM

## 2011-11-15 DIAGNOSIS — R209 Unspecified disturbances of skin sensation: Secondary | ICD-10-CM

## 2011-11-15 DIAGNOSIS — R413 Other amnesia: Secondary | ICD-10-CM

## 2011-11-15 MED ORDER — GABAPENTIN 100 MG PO CAPS: 100.0000 mg | ORAL_CAPSULE | Freq: Every day | ORAL | Status: DC

## 2011-11-15 NOTE — Progress Notes (Signed)
  Subjective:    Patient ID: Isaiah Cordova, male    DOB: [birth year], 41 y.o.   MRN: [id number]  HPI  41 year old white male with history of multiple concussions for followup regarding unexplained paresthesias. Patient continues to have intermittent paresthesias of his hand and legs. He describes losing control of his hands while he is typing periodically. He is also still experiencing numbness and tingling in his peroneal area and penis.  His symptoms are not related to prolonged sitting.  Patient has had at least 8 concussions in the past. His most recent head trauma was in August of 2012. He was hit by a softball on left side of his face and suffered a mild concussion and maxillary fracture.  Previous neurologic evaluation in 2010 reviewed in detail with patient.   Review of Systems  He denies double vision.  Occasional mumbling of speech  Past Medical History  Diagnosis Date  . Concussion   . Prostatitis   . Depression   . Hemorrhoids, external   . Rectal bleeding     history of, negative colonoscopy 2005-2006  . Headache     chronic  . Irritable bowel     History   Social History  . Marital Status: Married    Spouse Name: Andrey Campanile    Number of Children: 0  . Years of Education: N/A   Occupational History  . [employer]    Social History Main Topics  . Smoking status: Never Smoker   . Smokeless tobacco: Never Used  . Alcohol Use: Yes  . Drug Use: Not on file  . Sexually Active: Not on file   Other Topics Concern  . Not on file   Social History Narrative   Regular exercise - yes    Past Surgical History  Procedure Date  . Hernia repair   . Nasal septum surgery   . Retinal detachment surgery     Family History  Problem Relation Age of Onset  . Hypertension Father   . Diabetes Father   . Hyperlipidemia Father   . Depression Maternal Grandfather     prostate  . Depression Paternal Grandfather     committed suicide  . Hypothyroidism Other       Allergies  Allergen Reactions  . Lisdexamfetamine     REACTION: chest pains    Current Outpatient Prescriptions on File Prior to Visit  Medication Sig Dispense Refill  . timolol (TIMOPTIC) 0.25 % ophthalmic solution Place 1 drop into the left eye daily.        . travoprost, benzalkonium, (TRAVATAN) 0.004 % ophthalmic solution Place 1 drop into both eyes at bedtime.        . gabapentin (NEURONTIN) 100 MG capsule Take 1 capsule (100 mg total) by mouth at bedtime.  30 capsule  1    BP 116/74  Temp 98.5 F (36.9 C) (Oral)  Wt 198 lb (89.812 kg)       Objective:   Physical Exam  Constitutional: He is oriented to person, place, and time. He appears well-developed and well-nourished.  Cardiovascular: Normal rate, regular rhythm and normal heart sounds.   Pulmonary/Chest: Effort normal. He has wheezes.  Neurological: He is alert and oriented to person, place, and time. No cranial nerve deficit.  Psychiatric: He has a normal mood and affect. His behavior is normal.       Assessment & Plan:

## 2011-11-15 NOTE — Assessment & Plan Note (Signed)
Previous neurologic workup in 2010 was normal. However patient having progressive symptoms.  He has upper and lower extremity paresthesias. He also has numbness and tingling of his peroneal area and penis.  I doubt compressive neuropathy.  Rule out multiple sclerosis. Obtain MRI of brain with and without contrast and MRI of C-spine.  Trial of low dose gabapentin at bedtime.    We discussed referral to tertiary center.  Unclear whether his symptoms can be explained by his history of multiple concussions.

## 2011-11-22 ENCOUNTER — Ambulatory Visit (HOSPITAL_BASED_OUTPATIENT_CLINIC_OR_DEPARTMENT_OTHER)
Admission: RE | Admit: 2011-11-22 | Discharge: 2011-11-22 | Disposition: A | Discharge status: Home / Self Care | Payer: BC Managed Care – PPO | Source: Ambulatory Visit | Attending: Internal Medicine | Admitting: Internal Medicine

## 2011-11-22 ENCOUNTER — Encounter (HOSPITAL_BASED_OUTPATIENT_CLINIC_OR_DEPARTMENT_OTHER): Payer: Self-pay

## 2011-11-22 ENCOUNTER — Other Ambulatory Visit (HOSPITAL_BASED_OUTPATIENT_CLINIC_OR_DEPARTMENT_OTHER): Payer: BC Managed Care – PPO

## 2011-11-22 ENCOUNTER — Telehealth: Payer: Self-pay | Admitting: Internal Medicine

## 2011-11-22 DIAGNOSIS — M5020 Other cervical disc displacement, unspecified cervical region: Secondary | ICD-10-CM | POA: Insufficient documentation

## 2011-11-22 DIAGNOSIS — R209 Unspecified disturbances of skin sensation: Secondary | ICD-10-CM | POA: Insufficient documentation

## 2011-11-22 DIAGNOSIS — R202 Paresthesia of skin: Secondary | ICD-10-CM

## 2011-11-22 DIAGNOSIS — M47812 Spondylosis without myelopathy or radiculopathy, cervical region: Secondary | ICD-10-CM | POA: Insufficient documentation

## 2011-11-22 DIAGNOSIS — R413 Other amnesia: Secondary | ICD-10-CM | POA: Insufficient documentation

## 2011-11-22 MED ORDER — GADOBENATE DIMEGLUMINE 529 MG/ML IV SOLN
18.0000 mL | Freq: Once | INTRAVENOUS | Status: AC | PRN
  Administered 2011-11-22: 18 mL via INTRAVENOUS

## 2011-11-22 NOTE — Telephone Encounter (Signed)
Pt is sch for brain mri and cervical mri this afternoon. Need to get permission to change order for Cervical MRI to with and without contrast.

## 2011-11-22 NOTE — Telephone Encounter (Signed)
Cindy, plz change MRI of C spine to w and w/o contrast

## 2011-11-22 NOTE — Telephone Encounter (Signed)
MRI order placed.

## 2012-01-09 ENCOUNTER — Encounter: Payer: Self-pay | Admitting: Internal Medicine

## 2012-01-09 ENCOUNTER — Ambulatory Visit (INDEPENDENT_AMBULATORY_CARE_PROVIDER_SITE_OTHER): Payer: BC Managed Care – PPO | Admitting: Internal Medicine

## 2012-01-09 VITALS — BP 112/74 | HR 76 | Temp 98.7°F | Wt 200.0 lb

## 2012-01-09 DIAGNOSIS — E291 Testicular hypofunction: Secondary | ICD-10-CM

## 2012-01-09 MED ORDER — GABAPENTIN 100 MG PO CAPS: 200.0000 mg | ORAL_CAPSULE | Freq: Every day | ORAL | Status: DC

## 2012-01-09 MED ORDER — TESTOSTERONE CYPIONATE 100 MG/ML IM SOLN: 100.0000 mg | INTRAMUSCULAR | Status: DC

## 2012-01-09 NOTE — Progress Notes (Signed)
  Subjective:    Patient ID: Isaiah Cordova, male    DOB: 10-Jun-1970, 41 y.o.   MRN: [id number]  HPI  41 year old white male with chronic fatigue syndrome and short-term memory loss for followup. Patient previously seen for unexplained paresthesias. MRI of brain and C-spine are normal. He has had good response to gabapentin 200 mg at bedtime. He denies any adverse effects from gabapentin.  He also complained of decreased libido in the past. Testosterone levels were in the low 200s. Iron studies were normal. MRI of brain is negative for pituitary abnormality.  Review of Systems Low libido. Normal sexual function, chronic fatigue  Past Medical History  Diagnosis Date  . Concussion   . Prostatitis   . Depression   . Hemorrhoids, external   . Rectal bleeding     history of, negative colonoscopy 2005-2006  . Headache     chronic  . Irritable bowel     History   Social History  . Marital Status: Married    Spouse Name: Andrey Campanile    Number of Children: 0  . Years of Education: N/A   Occupational History  . [employer]    Social History Main Topics  . Smoking status: Never Smoker   . Smokeless tobacco: Never Used  . Alcohol Use: Yes  . Drug Use: Not on file  . Sexually Active: Not on file   Other Topics Concern  . Not on file   Social History Narrative   Regular exercise - yes    Past Surgical History  Procedure Date  . Hernia repair   . Nasal septum surgery   . Retinal detachment surgery     Family History  Problem Relation Age of Onset  . Hypertension Father   . Diabetes Father   . Hyperlipidemia Father   . Depression Maternal Grandfather     prostate  . Depression Paternal Grandfather     committed suicide  . Hypothyroidism Other     Allergies  Allergen Reactions  . Lisdexamfetamine     REACTION: chest pains    Current Outpatient Prescriptions on File Prior to Visit  Medication Sig Dispense Refill  . timolol (TIMOPTIC) 0.25 % ophthalmic  solution Place 1 drop into the left eye daily.        . travoprost, benzalkonium, (TRAVATAN) 0.004 % ophthalmic solution Place 1 drop into both eyes at bedtime.        Marland Kitchen DISCONTD: gabapentin (NEURONTIN) 100 MG capsule Take 1 capsule (100 mg total) by mouth at bedtime.  30 capsule  1  . testosterone cypionate (DEPOTESTOTERONE CYPIONATE) 100 MG/ML injection Inject 1 mL (100 mg total) into the muscle every 14 (fourteen) days. For IM use only  10 mL  0    BP 112/74  Pulse 76  Temp 98.7 F (37.1 C) (Oral)  Wt 200 lb (90.719 kg)       Objective:   Physical Exam  Constitutional: He is oriented to person, place, and time. He appears well-developed and well-nourished.  HENT:  Head: Normocephalic and atraumatic.  Cardiovascular: Normal rate, regular rhythm and normal heart sounds.   No murmur heard. Pulmonary/Chest: Effort normal and breath sounds normal. He has no wheezes.  Neurological: He is alert and oriented to person, place, and time. No cranial nerve deficit.  Psychiatric: He has a normal mood and affect. His behavior is normal.          Assessment & Plan:

## 2012-01-09 NOTE — Patient Instructions (Addendum)
Please complete the following lab tests before your next follow up appointment: Testosterone level, PSA - 257.2 

## 2012-01-09 NOTE — Assessment & Plan Note (Signed)
41 year old with mild hypogonadism.  Iron studies and MRI of brain were normal. Patient has a 15-month-old child and would like to avoid topical gels. Start Depo-testosterone 100 mg every 2 weeks.  Monitor testosterone level and PSA before next OV.

## 2012-02-06 ENCOUNTER — Telehealth: Payer: Self-pay | Admitting: Family Medicine

## 2012-02-06 NOTE — Telephone Encounter (Signed)
Patient's testosterone prior Isaiah Cordova was approved by [employer]. Can you please send in a rx for it: testosterone cypionate (DEPOTESTOTERONE CYPIONATE) 100 MG/ML injection Pt uses [facility] on [address].

## 2012-02-06 NOTE — Telephone Encounter (Signed)
rx called in

## 2012-02-10 ENCOUNTER — Ambulatory Visit: Payer: BC Managed Care – PPO | Admitting: Internal Medicine

## 2012-03-29 ENCOUNTER — Encounter: Payer: Self-pay | Admitting: Internal Medicine

## 2012-03-29 ENCOUNTER — Ambulatory Visit (INDEPENDENT_AMBULATORY_CARE_PROVIDER_SITE_OTHER): Payer: BC Managed Care – PPO | Admitting: Internal Medicine

## 2012-03-29 VITALS — BP 118/74 | HR 84 | Temp 99.0°F | Wt 190.0 lb

## 2012-03-29 DIAGNOSIS — E291 Testicular hypofunction: Secondary | ICD-10-CM

## 2012-03-29 DIAGNOSIS — R1013 Epigastric pain: Secondary | ICD-10-CM | POA: Insufficient documentation

## 2012-03-29 DIAGNOSIS — R05 Cough: Secondary | ICD-10-CM | POA: Insufficient documentation

## 2012-03-29 DIAGNOSIS — R053 Chronic cough: Secondary | ICD-10-CM | POA: Insufficient documentation

## 2012-03-29 DIAGNOSIS — R059 Cough, unspecified: Secondary | ICD-10-CM

## 2012-03-29 MED ORDER — AZITHROMYCIN 250 MG PO TABS: ORAL_TABLET | ORAL | Status: DC

## 2012-03-29 MED ORDER — OMEPRAZOLE 40 MG PO CPDR: 40.0000 mg | DELAYED_RELEASE_CAPSULE | Freq: Every day | ORAL | Status: DC

## 2012-03-29 MED ORDER — HYDROCODONE-HOMATROPINE 5-1.5 MG/5ML PO SYRP: 5.0000 mL | ORAL_SOLUTION | Freq: Two times a day (BID) | ORAL | Status: DC | PRN

## 2012-03-29 NOTE — Assessment & Plan Note (Signed)
Patient complains of chronic cough x1 month. Symptoms started after a person for infection. He does not have bronchitis on exam. I suspect this symptoms secondary to postnasal drip. Possible sinusitis. Treat with azithromycin.  Patient advised to call office if symptoms persist or worsen.

## 2012-03-29 NOTE — Progress Notes (Signed)
Subjective:    Patient ID: Isaiah Cordova, male    DOB: 03/04/1971, 41 y.o.   MRN: [id number]  HPI  41 year old white male complains of chest congestion, cough, and  runny nose for last 3-4 weeks. His symptoms initially started while he was on a business trip to [place]. Patient took over-the-counter cold preps. His cough has somewhat improved but he still has nonproductive cough. Patient also has been experiencing intermittent lower sternal/epigastric discomfort.  His wife is concerned that this pain secondary to bronchitis. He took TUMS this morning and epigastric discomfort improved significantly.  He has not been taking NSAIDs.  Hypogonadism-he has been in using the tip of testosterone. This dissection month. He does not notice any significant improvement in libido.   Review of Systems Negative for shortness of breath, no change in BP  Past Medical History  Diagnosis Date  . Concussion   . Prostatitis   . Depression   . Hemorrhoids, external   . Rectal bleeding     history of, negative colonoscopy 2005-2006  . Headache     chronic  . Irritable bowel     History   Social History  . Marital Status: Married    Spouse Name: Andrey Campanile    Number of Children: 0  . Years of Education: N/A   Occupational History  . [employer]    Social History Main Topics  . Smoking status: Never Smoker   . Smokeless tobacco: Never Used  . Alcohol Use: Yes  . Drug Use: Not on file  . Sexually Active: Not on file   Other Topics Concern  . Not on file   Social History Narrative   Regular exercise - yes    Past Surgical History  Procedure Date  . Hernia repair   . Nasal septum surgery   . Retinal detachment surgery     Family History  Problem Relation Age of Onset  . Hypertension Father   . Diabetes Father   . Hyperlipidemia Father   . Depression Maternal Grandfather     prostate  . Depression Paternal Grandfather     committed suicide  . Hypothyroidism  Other     Allergies  Allergen Reactions  . Lisdexamfetamine     REACTION: chest pains    Current Outpatient Prescriptions on File Prior to Visit  Medication Sig Dispense Refill  . gabapentin (NEURONTIN) 100 MG capsule Take 2 capsules (200 mg total) by mouth at bedtime.  60 capsule  5  . timolol (TIMOPTIC) 0.25 % ophthalmic solution Place 1 drop into the left eye daily.        . travoprost, benzalkonium, (TRAVATAN) 0.004 % ophthalmic solution Place 1 drop into both eyes at bedtime.        Marland Kitchen DISCONTD: testosterone cypionate (DEPOTESTOTERONE CYPIONATE) 100 MG/ML injection Inject 1 mL (100 mg total) into the muscle every 14 (fourteen) days. For IM use only  10 mL  0  . omeprazole (PRILOSEC) 40 MG capsule Take 1 capsule (40 mg total) by mouth daily. (Take 30 minutes on empty stomach before breakfast)  30 capsule  1    BP 118/74  Pulse 84  Temp 99 F (37.2 C) (Oral)  Wt 190 lb (86.183 kg)       Objective:   Physical Exam  Constitutional: He appears well-developed and well-nourished.  HENT:  Head: Normocephalic and atraumatic.  Left Ear: External ear normal.       Oropharyngeal erythema, right tympanic membrane slightly retracted  Neck: Neck supple.       No neck tenderness  Cardiovascular: Normal rate, regular rhythm and normal heart sounds.   Pulmonary/Chest: Effort normal and breath sounds normal. He has no wheezes.  Abdominal: Soft. Bowel sounds are normal.       Epigastric tenderness  Lymphadenopathy:    He has no cervical adenopathy.  Skin: Skin is warm and dry.  Psychiatric: He has a normal mood and affect. His behavior is normal.          Assessment & Plan:

## 2012-03-29 NOTE — Patient Instructions (Addendum)
After you finish 6-8 week course of omeprazole, use OTC zantac 150 mg twice daily for 1-2 weeks, then as needed Please call our office if your cough does not improve or gets worse.

## 2012-03-29 NOTE — Assessment & Plan Note (Signed)
Monitor testosterone levels and PSA.

## 2012-03-29 NOTE — Assessment & Plan Note (Signed)
Patient complains of lower sternal/epigastric pain. Wife was concerned his symptoms may be secondary to bronchitis. However his symptoms improved after taking TUMS. I suspect he has reflux. Treat with omeprazole 40 mg once daily for 6-8 weeks. Patient instructed to transition to Zantac 150 mg twice daily for 1 to 2 weeks, then as needed.

## 2012-03-30 ENCOUNTER — Other Ambulatory Visit: Payer: Self-pay | Admitting: Internal Medicine

## 2012-03-30 DIAGNOSIS — E291 Testicular hypofunction: Secondary | ICD-10-CM

## 2012-03-30 LAB — TESTOSTERONE, FREE, TOTAL, SHBG
Sex Hormone Binding: 35 nmol/L (ref 13–71)
Testosterone, Free: 57.4 pg/mL (ref 47.0–244.0)

## 2012-03-30 LAB — PSA: PSA: 0.87 ng/mL (ref 0.10–4.00)

## 2012-03-30 MED ORDER — TESTOSTERONE CYPIONATE 100 MG/ML IM SOLN: 200.0000 mg | INTRAMUSCULAR | Status: DC

## 2012-03-30 NOTE — Assessment & Plan Note (Signed)
Lab Results  Component Value Date   TESTOSTERONE 302.06 03/29/2012   Called patient and instructed him to increase dose to 200 mg IM q 14 days of depo-testosterone.  Repeat testosterone level in 1 month.

## 2012-04-03 ENCOUNTER — Other Ambulatory Visit: Payer: Self-pay | Admitting: *Deleted

## 2012-04-03 MED ORDER — PANTOPRAZOLE SODIUM 40 MG PO TBEC: 40.0000 mg | DELAYED_RELEASE_TABLET | Freq: Every day | ORAL | Status: DC

## 2012-04-03 MED ORDER — "SYRINGE/NEEDLE (DISP) 27G X 1-1/2"" 3 ML MISC": 1.0000 | Status: DC

## 2012-04-18 ENCOUNTER — Telehealth: Payer: Self-pay | Admitting: Internal Medicine

## 2012-04-18 MED ORDER — ESOMEPRAZOLE MAGNESIUM 40 MG PO CPDR: 40.0000 mg | DELAYED_RELEASE_CAPSULE | Freq: Every day | ORAL | Status: DC

## 2012-04-18 NOTE — Telephone Encounter (Signed)
rx sent in electronically, pt aware 

## 2012-04-18 NOTE — Telephone Encounter (Signed)
Pt called and said that med for acid reflux is not helping. Symptoms went away for a little while, but have returned. Pt req diff med. [facility] on [address] in [place].

## 2012-04-18 NOTE — Telephone Encounter (Signed)
Left message for pt to call back  °

## 2012-04-18 NOTE — Telephone Encounter (Signed)
Pls advise.  

## 2012-04-18 NOTE — Telephone Encounter (Signed)
Please change omeprazole to nexium 40 mg  # 30 - one po 15 minutes before AM meal.  RFx3

## 2012-04-25 ENCOUNTER — Other Ambulatory Visit: Payer: Self-pay | Admitting: *Deleted

## 2012-04-25 MED ORDER — "SYRINGE 22G X 1-1/2"" 3 ML MISC": 1.0000 | Status: DC

## 2012-04-25 MED ORDER — "NEEDLE (DISP) 20G X 1"" MISC": 1.0000 | Status: DC

## 2012-11-13 ENCOUNTER — Telehealth: Payer: Self-pay | Admitting: Internal Medicine

## 2012-11-13 NOTE — Telephone Encounter (Signed)
Pt has numbness and tingling in hands and fingers. Pt has seen Dr Artist Pais for this before, and it is getting worse. Pt would like to come in  Friday. Only 3 same day apps that day. Pls advise.

## 2012-11-13 NOTE — Telephone Encounter (Signed)
appt made pt aware

## 2012-11-13 NOTE — Telephone Encounter (Signed)
Ok to use SDA. 

## 2012-11-16 ENCOUNTER — Ambulatory Visit (INDEPENDENT_AMBULATORY_CARE_PROVIDER_SITE_OTHER): Payer: BC Managed Care – PPO | Admitting: Internal Medicine

## 2012-11-16 ENCOUNTER — Encounter: Payer: Self-pay | Admitting: Internal Medicine

## 2012-11-16 VITALS — BP 122/80 | Temp 99.0°F | Wt 195.0 lb

## 2012-11-16 DIAGNOSIS — R202 Paresthesia of skin: Secondary | ICD-10-CM

## 2012-11-16 DIAGNOSIS — G2581 Restless legs syndrome: Secondary | ICD-10-CM

## 2012-11-16 DIAGNOSIS — E291 Testicular hypofunction: Secondary | ICD-10-CM

## 2012-11-16 DIAGNOSIS — L219 Seborrheic dermatitis, unspecified: Secondary | ICD-10-CM

## 2012-11-16 DIAGNOSIS — L218 Other seborrheic dermatitis: Secondary | ICD-10-CM

## 2012-11-16 DIAGNOSIS — R209 Unspecified disturbances of skin sensation: Secondary | ICD-10-CM

## 2012-11-16 HISTORY — DX: Restless legs syndrome: G25.81

## 2012-11-16 MED ORDER — PRAMIPEXOLE DIHYDROCHLORIDE 0.25 MG PO TABS: 0.2500 mg | ORAL_TABLET | Freq: Every day | ORAL | Status: DC

## 2012-11-16 MED ORDER — GABAPENTIN 100 MG PO CAPS: 100.0000 mg | ORAL_CAPSULE | Freq: Three times a day (TID) | ORAL | Status: DC | PRN

## 2012-11-16 MED ORDER — CLOBETASOL PROPIONATE 0.05 % EX SHAM: MEDICATED_SHAMPOO | CUTANEOUS | Status: DC

## 2012-11-16 MED ORDER — TESTOSTERONE CYPIONATE 100 MG/ML IM SOLN: 200.0000 mg | INTRAMUSCULAR | Status: DC

## 2012-11-16 NOTE — Progress Notes (Signed)
Subjective:    Patient ID: Isaiah Cordova, male    DOB: 1970/12/04, 42 y.o.   MRN: [id number]  HPI  42 year old white male with history of mild hypogonadism, chronic fatigue and possible restless leg syndrome for followup. Patient continues to have intermittent paresthesias of unclear etiology. He's had previous neurologic workup which included MRI of brain and C spine. This was negative. He also had negative there conduction study. He has not been using gabapentin as directed.  Patient also complains of possible symptoms of restless leg syndrome. He reports he takes the covers all night. His wife has noticed significant limb movement during sleep. He has not had formal sleep test.  Hypogonadism-he has been using testosterone every 2 weeks. He reports his libido has improved. However it has not changed his symptoms of fatigue.   Review of Systems Negative for weight changes, no other sleep issues.  He also complains of red, irritated scalp x 2-4 weeks.  His symptoms started after using "hotel" shampoo while he was traveling.  Past Medical History  Diagnosis Date  . Concussion   . Prostatitis   . Depression   . Hemorrhoids, external   . Rectal bleeding     history of, negative colonoscopy 2005-2006  . Headache(784.0)     chronic  . Irritable bowel     History   Social History  . Marital Status: Married    Spouse Name: Andrey Campanile    Number of Children: 0  . Years of Education: N/A   Occupational History  . [employer]    Social History Main Topics  . Smoking status: Never Smoker   . Smokeless tobacco: Never Used  . Alcohol Use: Yes  . Drug Use: Not on file  . Sexually Active: Not on file   Other Topics Concern  . Not on file   Social History Narrative   Regular exercise - yes    Past Surgical History  Procedure Laterality Date  . Hernia repair    . Nasal septum surgery    . Retinal detachment surgery      Family History  Problem Relation Age of  Onset  . Hypertension Father   . Diabetes Father   . Hyperlipidemia Father   . Depression Maternal Grandfather     prostate  . Depression Paternal Grandfather     committed suicide  . Hypothyroidism Other     Allergies  Allergen Reactions  . Lisdexamfetamine     REACTION: chest pains    Current Outpatient Prescriptions on File Prior to Visit  Medication Sig Dispense Refill  . NEEDLE, DISP, 20 G (BD DISP NEEDLES) 20G X 1" MISC 1 each by Does not apply route every 30 (thirty) days. To draw up med  100 each  11  . Syringe/Needle, Disp, (SYRINGE 3CC/22GX1-1/2") 22G X 1-1/2" 3 ML MISC 1 each by Does not apply route every 30 (thirty) days. To administer med  100 each  11  . timolol (TIMOPTIC) 0.25 % ophthalmic solution Place 1 drop into the left eye daily.        . travoprost, benzalkonium, (TRAVATAN) 0.004 % ophthalmic solution Place 1 drop into both eyes at bedtime.         No current facility-administered medications on file prior to visit.    BP 122/80  Temp(Src) 99 F (37.2 C) (Oral)  Wt 195 lb (88.451 kg)  BMI 25.03 kg/m2       Objective:   Physical Exam  Constitutional: He  appears well-developed and well-nourished.  HENT:  Head: Normocephalic and atraumatic.  Right Ear: External ear normal.  Left Ear: External ear normal.  Cardiovascular: Normal rate and regular rhythm.   Pulmonary/Chest: Effort normal. He has no wheezes.  Musculoskeletal: He exhibits no edema.  No muscle atrophy  Neurological: He is alert. No cranial nerve deficit.  Skin: Skin is warm and dry.  Mild erythema of his scalp near vertex  Psychiatric: He has a normal mood and affect. His behavior is normal.          Assessment & Plan:

## 2012-11-16 NOTE — Assessment & Plan Note (Signed)
Good response to depo testosterone. His libido has improved.  Monitor testosterone and PSA levels.

## 2012-11-16 NOTE — Patient Instructions (Signed)
Take Mirapex 0.25 mg at bedtime regularly for restless leg syndrome Use gabapentin 100 mg 3 times daily as needed for tingling sensation.

## 2012-11-16 NOTE — Assessment & Plan Note (Signed)
Patient complains of red, itchy, irritated scalp for last 2-4 weeks. Trial of clobetasol shampoo x2 weeks.

## 2012-11-16 NOTE — Assessment & Plan Note (Signed)
Patient has history consistent with restless leg syndrome. Trial of Mirapex 0.25 mg at bedtime. Reassess in 2 months.

## 2012-11-16 NOTE — Assessment & Plan Note (Signed)
Patient's MRI of brain and C-spine were normal. His nerve conduction study completed in 06/04/2010 was normal. There is no evidence of generalized polyneuropathy. No evidence of carpal tunnel and upper remedy. Patient reports intermittent tingling burning sensation in arms and legs of unclear etiology. Used gabapentin 100 mg 3 times a day as needed.

## 2013-01-11 ENCOUNTER — Ambulatory Visit (INDEPENDENT_AMBULATORY_CARE_PROVIDER_SITE_OTHER): Payer: BC Managed Care – PPO | Admitting: Internal Medicine

## 2013-01-11 ENCOUNTER — Encounter: Payer: Self-pay | Admitting: Internal Medicine

## 2013-01-11 ENCOUNTER — Telehealth: Payer: Self-pay | Admitting: Internal Medicine

## 2013-01-11 VITALS — BP 124/82 | HR 72 | Temp 98.2°F | Wt 201.0 lb

## 2013-01-11 DIAGNOSIS — E291 Testicular hypofunction: Secondary | ICD-10-CM

## 2013-01-11 DIAGNOSIS — L659 Nonscarring hair loss, unspecified: Secondary | ICD-10-CM

## 2013-01-11 DIAGNOSIS — L219 Seborrheic dermatitis, unspecified: Secondary | ICD-10-CM

## 2013-01-11 DIAGNOSIS — G2581 Restless legs syndrome: Secondary | ICD-10-CM

## 2013-01-11 DIAGNOSIS — L218 Other seborrheic dermatitis: Secondary | ICD-10-CM

## 2013-01-11 MED ORDER — CLOBETASOL PROPIONATE 0.05 % EX SHAM: MEDICATED_SHAMPOO | CUTANEOUS | Status: DC

## 2013-01-11 MED ORDER — TESTOSTERONE CYPIONATE 100 MG/ML IM SOLN: INTRAMUSCULAR | Status: DC

## 2013-01-11 MED ORDER — PRAMIPEXOLE DIHYDROCHLORIDE 0.5 MG PO TABS: 0.5000 mg | ORAL_TABLET | Freq: Every day | ORAL | Status: DC

## 2013-01-11 NOTE — Assessment & Plan Note (Signed)
Improved but not completely resolved. Repeat use of clobetasol shampoo.

## 2013-01-11 NOTE — Assessment & Plan Note (Signed)
Improved.  Titrate mirapex to .5 mg.

## 2013-01-11 NOTE — Assessment & Plan Note (Signed)
The patient has mild male pattern baldness. I doubt symptoms related to hypothyroidism. Repeat thyroid function tests before next office visit.

## 2013-01-11 NOTE — Telephone Encounter (Signed)
Pharmacy called and stated that the pt already has 5 good refills of his estosterone cypionate (DEPOTESTOTERONE CYPIONATE) 100 MG/ML injection. Those refills do not expire until November, can she utilize these instead of new RX. Please assist.

## 2013-01-11 NOTE — Assessment & Plan Note (Signed)
Reduce testosterone IM injections to every 21 days. Repeat testosterone levels before next office visit. Lab Results  Component Value Date   TESTOSTERONE 612 11/16/2012   Lab Results  Component Value Date   PSA 0.91 11/16/2012   PSA 0.87 03/29/2012

## 2013-01-11 NOTE — Progress Notes (Signed)
Subjective:    Patient ID: Isaiah Cordova, male    DOB: [birth year], 42 y.o.   MRN: [id number]  HPI  42 year old with history of mild hypogonadism, chronic fatigue and possible restless leg syndrome for followup. Patient reports mild to moderate improvement of restless leg syndrome symptoms after starting Mirapex 0.25 mg at bedtime.  Hypogonadism-reviewed testosterone levels. Minimal increase in PSA. Libido has normalized.  Over the last couple days patient complains of sore throat and productive cough of yellowish sputum. He denies any shortness of breath. He denies any wheezing. He is currently using Mucinex DM over-the-counter.  Scalp irritation has improved with steroid shampoo but not completely resolved.  His wife concerned he may have hypothyroidism.   Lab Results  Component Value Date   TSH 1.452 10/28/2011     Review of Systems Negative for fever or shortness of breath    Past Medical History  Diagnosis Date  . Concussion   . Prostatitis   . Depression   . Hemorrhoids, external   . Rectal bleeding     history of, negative colonoscopy 2005-2006  . Headache(784.0)     chronic  . Irritable bowel     History   Social History  . Marital Status: Married    Spouse Name: Andrey Campanile    Number of Children: 0  . Years of Education: N/A   Occupational History  . [employer]    Social History Main Topics  . Smoking status: Never Smoker   . Smokeless tobacco: Never Used  . Alcohol Use: Yes  . Drug Use: Not on file  . Sexual Activity: Not on file   Other Topics Concern  . Not on file   Social History Narrative   Regular exercise - yes    Past Surgical History  Procedure Laterality Date  . Hernia repair    . Nasal septum surgery    . Retinal detachment surgery      Family History  Problem Relation Age of Onset  . Hypertension Father   . Diabetes Father   . Hyperlipidemia Father   . Depression Maternal Grandfather     prostate  . Depression  Paternal Grandfather     committed suicide  . Hypothyroidism Other     Allergies  Allergen Reactions  . Lisdexamfetamine     REACTION: chest pains    Current Outpatient Prescriptions on File Prior to Visit  Medication Sig Dispense Refill  . gabapentin (NEURONTIN) 100 MG capsule Take 1 capsule (100 mg total) by mouth 3 (three) times daily as needed.  90 capsule  5  . latanoprost (XALATAN) 0.005 % ophthalmic solution Place 1 drop into both eyes daily.      Marland Kitchen NEEDLE, DISP, 20 G (BD DISP NEEDLES) 20G X 1" MISC 1 each by Does not apply route every 30 (thirty) days. To draw up med  100 each  11  . Syringe/Needle, Disp, (SYRINGE 3CC/22GX1-1/2") 22G X 1-1/2" 3 ML MISC 1 each by Does not apply route every 30 (thirty) days. To administer med  100 each  11  . timolol (TIMOPTIC) 0.25 % ophthalmic solution Place 1 drop into the left eye daily.        . travoprost, benzalkonium, (TRAVATAN) 0.004 % ophthalmic solution Place 1 drop into both eyes at bedtime.         No current facility-administered medications on file prior to visit.    BP 124/82  Pulse 72  Temp(Src) 98.2 F (36.8 C) (Oral)  Wt 201 lb (91.173 kg)  BMI 25.8 kg/m2    Objective:   Physical Exam  Constitutional: He is oriented to person, place, and time. He appears well-developed and well-nourished.  HENT:  Head: Normocephalic and atraumatic.  Right Ear: External ear normal.  Left Ear: External ear normal.  Oropharyngeal erythema  Neck: Neck supple.  Cardiovascular: Normal rate, regular rhythm and normal heart sounds.   No murmur heard. Pulmonary/Chest: Effort normal and breath sounds normal. He has no wheezes.  Lymphadenopathy:    He has no cervical adenopathy.  Neurological: He is alert and oriented to person, place, and time. No cranial nerve deficit.  Psychiatric: He has a normal mood and affect. His behavior is normal.          Assessment & Plan:

## 2013-01-11 NOTE — Patient Instructions (Addendum)
Increase fluid intake and get plenty of rest. Please contact our office if your symptoms do not improve or gets worse. Please complete the following lab tests before your next follow up appointment: Total and Free Testosterone level -  257.2

## 2013-01-14 NOTE — Telephone Encounter (Signed)
Ok per Dr Artist Pais, pharmacy aware

## 2013-04-12 ENCOUNTER — Telehealth: Payer: Self-pay | Admitting: Internal Medicine

## 2013-04-12 NOTE — Telephone Encounter (Signed)
You have 12 patients on Monday

## 2013-04-12 NOTE — Telephone Encounter (Signed)
Pt is out of town and is having adominal pain. Would like to see the doctor. Pt refused another provider. Will be home Mon. 11/17. No appts. pls advise.

## 2013-04-12 NOTE — Telephone Encounter (Signed)
Have him come at 1 pm.  Ok to add

## 2013-04-15 NOTE — Telephone Encounter (Signed)
Pt states he is feeling better today. Thank you anyway. Pt will call if it re occurs.

## 2013-05-17 ENCOUNTER — Other Ambulatory Visit (INDEPENDENT_AMBULATORY_CARE_PROVIDER_SITE_OTHER): Payer: BC Managed Care – PPO

## 2013-05-17 DIAGNOSIS — L659 Nonscarring hair loss, unspecified: Secondary | ICD-10-CM

## 2013-05-17 DIAGNOSIS — E291 Testicular hypofunction: Secondary | ICD-10-CM

## 2013-05-20 LAB — TESTOSTERONE, FREE, TOTAL, SHBG: Testosterone-% Free: 1.9 % (ref 1.6–2.9)

## 2013-05-27 ENCOUNTER — Ambulatory Visit (INDEPENDENT_AMBULATORY_CARE_PROVIDER_SITE_OTHER): Payer: BC Managed Care – PPO | Admitting: Internal Medicine

## 2013-05-27 ENCOUNTER — Encounter: Payer: Self-pay | Admitting: Internal Medicine

## 2013-05-27 VITALS — BP 122/80 | HR 76 | Temp 99.1°F | Ht 74.0 in | Wt 204.0 lb

## 2013-05-27 DIAGNOSIS — R059 Cough, unspecified: Secondary | ICD-10-CM

## 2013-05-27 DIAGNOSIS — E291 Testicular hypofunction: Secondary | ICD-10-CM

## 2013-05-27 DIAGNOSIS — R05 Cough: Secondary | ICD-10-CM

## 2013-05-27 DIAGNOSIS — G2581 Restless legs syndrome: Secondary | ICD-10-CM

## 2013-05-27 MED ORDER — TESTOSTERONE CYPIONATE 100 MG/ML IM SOLN: INTRAMUSCULAR | Status: DC

## 2013-05-27 NOTE — Patient Instructions (Addendum)
Take probiotic supplement (ie Align or Culturelle) for 1-2 months Use intranasal saline spray and gargle with warm salt water Drink plenty of fluids and rest as much as possible. Please complete the following lab tests before your next follow up appointment: Total and free testosterone, CBCD - 257.2

## 2013-05-27 NOTE — Progress Notes (Signed)
Subjective:    Patient ID: Isaiah Cordova, male    DOB: June 19, 1970, 42 y.o.   MRN: [id number]  HPI  42 year old white male with history of hypogonadism, chronic fatigue and restless leg syndrome for followup. At previous visit patient was prescribed combination of Mirapex and gabapentin. Patient reports gabapentin, side effect of behavioral disturbance. He stopped both gabapentin and Mirapex.  Interval medical history-he was seen at urgent care on 05/03/2013 secondary to possible upper respiratory infection. He was treated with amoxicillin 500 mg twice daily for 20 days. Patient reports he has been feeling more fatigued than usual. His work schedule has been more hectic than usual. He denies any fever or chills. He has chronic residual nagging nonproductive cough.  Hypogonadism-his libido is slightly worse. His testosterone level is less than 300 since increasing his deop testosterone injection frequency to every 3 weeks.   Review of Systems Negative for wheezing or shortness of breath    Past Medical History  Diagnosis Date  . Concussion   . Prostatitis   . Depression   . Hemorrhoids, external   . Rectal bleeding     history of, negative colonoscopy 2005-2006  . Headache(784.0)     chronic  . Irritable bowel     History   Social History  . Marital Status: Married    Spouse Name: Andrey Campanile    Number of Children: 0  . Years of Education: N/A   Occupational History  . [employer]    Social History Main Topics  . Smoking status: Never Smoker   . Smokeless tobacco: Never Used  . Alcohol Use: Yes  . Drug Use: Not on file  . Sexual Activity: Not on file   Other Topics Concern  . Not on file   Social History Narrative   Regular exercise - yes    Past Surgical History  Procedure Laterality Date  . Hernia repair    . Nasal septum surgery    . Retinal detachment surgery      Family History  Problem Relation Age of Onset  . Hypertension Father   .  Diabetes Father   . Hyperlipidemia Father   . Depression Maternal Grandfather     prostate  . Depression Paternal Grandfather     committed suicide  . Hypothyroidism Other     Allergies  Allergen Reactions  . Lisdexamfetamine     REACTION: chest pains    Current Outpatient Prescriptions on File Prior to Visit  Medication Sig Dispense Refill  . Clobetasol Propionate 0.05 % shampoo Apply thin film of shampoo once daily for 2 weeks  118 mL  1  . latanoprost (XALATAN) 0.005 % ophthalmic solution Place 1 drop into both eyes daily.      Marland Kitchen NEEDLE, DISP, 20 G (BD DISP NEEDLES) 20G X 1" MISC 1 each by Does not apply route every 30 (thirty) days. To draw up med  100 each  11  . Syringe/Needle, Disp, (SYRINGE 3CC/22GX1-1/2") 22G X 1-1/2" 3 ML MISC 1 each by Does not apply route every 30 (thirty) days. To administer med  100 each  11  . timolol (TIMOPTIC) 0.25 % ophthalmic solution Place 1 drop into the left eye daily.        . travoprost, benzalkonium, (TRAVATAN) 0.004 % ophthalmic solution Place 1 drop into both eyes at bedtime.         No current facility-administered medications on file prior to visit.    BP 122/80  Pulse  76  Temp(Src) 99.1 F (37.3 C) (Oral)  Ht 6\' 2"  (1.88 m)  Wt 204 lb (92.534 kg)  BMI 26.18 kg/m2    Objective:   Physical Exam  Constitutional: He is oriented to person, place, and time. He appears well-developed and well-nourished.  HENT:  Head: Normocephalic and atraumatic.  Right Ear: External ear normal.  Left Ear: External ear normal.  Mild oropharyngeal erythema, signs of postnasal drip  Neck: Neck supple.  Cardiovascular: Normal rate, regular rhythm and normal heart sounds.   Pulmonary/Chest: Effort normal and breath sounds normal. He has no wheezes.  Musculoskeletal: He exhibits no edema.  Lymphadenopathy:    He has no cervical adenopathy.  Neurological: He is alert and oriented to person, place, and time. No cranial nerve deficit.  Psychiatric:  He has a normal mood and affect. His behavior is normal.          Assessment & Plan:

## 2013-05-27 NOTE — Progress Notes (Signed)
Pre visit review using our clinic review tool, if applicable. No additional management support is needed unless otherwise documented below in the visit note. 

## 2013-05-27 NOTE — Assessment & Plan Note (Signed)
Increase testosterone IM injections to every 14 days. Repeat free and total testosterone levels before next office visit. Monitor CBC.

## 2013-05-27 NOTE — Assessment & Plan Note (Signed)
Gabapentin added to Mirapex but patient noticed behavioral disturbance. Restart Mirapex at lower dose 0.25 mg at bedtime.  Discontinue gabapentin.

## 2013-05-27 NOTE — Assessment & Plan Note (Signed)
Patient likely had viral upper respiratory infection in December of 2014. I would not recommend any additional antibiotics. The use intranasal saline and gargle warm saltwater. Patient advised to call office if symptoms persist or worsen.

## 2013-05-28 ENCOUNTER — Ambulatory Visit: Payer: BC Managed Care – PPO | Admitting: Internal Medicine

## 2013-06-11 ENCOUNTER — Telehealth: Payer: Self-pay | Admitting: Internal Medicine

## 2013-06-11 NOTE — Telephone Encounter (Signed)
[facility] states testosterone cypionate (DEPOTESTOTERONE CYPIONATE) 100 MG/ML injection is on back order till April.  They would like to know if they can substitute to RX with 200 ML and have the pt inject 1/2??  You can call [facility] with the information.

## 2013-06-12 NOTE — Telephone Encounter (Signed)
See if another pharm has 100mg /ml and send rx to new pharm

## 2013-06-13 NOTE — Telephone Encounter (Signed)
Talked to pt and he does not need a refill right now.  He will call back when he needs a refill and he will p/u a printed rx and shop around to different pharmacies.

## 2013-07-05 ENCOUNTER — Encounter: Payer: Self-pay | Admitting: Family Medicine

## 2013-07-05 ENCOUNTER — Ambulatory Visit (INDEPENDENT_AMBULATORY_CARE_PROVIDER_SITE_OTHER): Payer: BC Managed Care – PPO | Admitting: Family Medicine

## 2013-07-05 VITALS — BP 124/80 | HR 89 | Temp 98.2°F | Wt 207.0 lb

## 2013-07-05 DIAGNOSIS — F0781 Postconcussional syndrome: Secondary | ICD-10-CM

## 2013-07-05 DIAGNOSIS — M549 Dorsalgia, unspecified: Secondary | ICD-10-CM

## 2013-07-05 MED ORDER — NAPROXEN 500 MG PO TABS: 500.0000 mg | ORAL_TABLET | Freq: Two times a day (BID) | ORAL | Status: DC

## 2013-07-05 MED ORDER — METHOCARBAMOL 500 MG PO TABS: 500.0000 mg | ORAL_TABLET | Freq: Three times a day (TID) | ORAL | Status: DC | PRN
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
## 2013-07-05 NOTE — Progress Notes (Signed)
Pre visit review using our clinic review tool, if applicable. No additional management support is needed unless otherwise documented below in the visit note. 

## 2013-08-26 ENCOUNTER — Ambulatory Visit (INDEPENDENT_AMBULATORY_CARE_PROVIDER_SITE_OTHER): Payer: BC Managed Care – PPO | Admitting: Internal Medicine

## 2013-08-26 ENCOUNTER — Encounter: Payer: Self-pay | Admitting: Internal Medicine

## 2013-08-26 VITALS — BP 122/80 | HR 84 | Temp 99.1°F | Ht 74.0 in | Wt 205.0 lb

## 2013-08-26 DIAGNOSIS — R5381 Other malaise: Secondary | ICD-10-CM

## 2013-08-26 DIAGNOSIS — K529 Noninfective gastroenteritis and colitis, unspecified: Secondary | ICD-10-CM

## 2013-08-26 DIAGNOSIS — R5383 Other fatigue: Secondary | ICD-10-CM

## 2013-08-26 DIAGNOSIS — K5289 Other specified noninfective gastroenteritis and colitis: Secondary | ICD-10-CM

## 2013-08-26 DIAGNOSIS — E291 Testicular hypofunction: Secondary | ICD-10-CM

## 2013-08-26 DIAGNOSIS — R5382 Chronic fatigue, unspecified: Secondary | ICD-10-CM

## 2013-08-26 HISTORY — DX: Other fatigue: R53.83

## 2013-08-26 MED ORDER — TESTOSTERONE CYPIONATE 100 MG/ML IM SOLN: INTRAMUSCULAR | Status: DC
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
## 2013-08-26 NOTE — Patient Instructions (Addendum)
Schedule next office visit as CPX. CPX labs before office visit Free and Total Testosterone, PSA - 257.2

## 2013-08-26 NOTE — Assessment & Plan Note (Signed)
Patient currently taking depo-testosterone 200 mg IM every 14 days. Monitor free and total testosterone levels.  Check PSA.

## 2013-08-26 NOTE — Assessment & Plan Note (Signed)
Patient reports recent episode of gastroenteritis. Patient advised to use over-the-counter probiotic supplement.

## 2013-08-26 NOTE — Assessment & Plan Note (Signed)
No obvious cause.  Extensive work up in the past negative.  Has been no significant improvement with testosterone replacement.

## 2013-08-27 LAB — TESTOSTERONE, FREE, TOTAL, SHBG
Sex Hormone Binding: 29 nmol/L (ref 13–71)
TESTOSTERONE FREE: 123.9 pg/mL (ref 47.0–244.0)
TESTOSTERONE-% FREE: 2.3 % (ref 1.6–2.9)
Testosterone: 542 ng/dL (ref 300–890)

## 2013-08-27 LAB — PSA: PSA: 0.91 ng/mL (ref 0.10–4.00)

## 2014-01-03 ENCOUNTER — Other Ambulatory Visit (INDEPENDENT_AMBULATORY_CARE_PROVIDER_SITE_OTHER): Payer: BC Managed Care – PPO

## 2014-01-03 DIAGNOSIS — Z0000 Encounter for general adult medical examination without abnormal findings: Secondary | ICD-10-CM

## 2014-01-03 DIAGNOSIS — E291 Testicular hypofunction: Secondary | ICD-10-CM

## 2014-01-03 LAB — POCT URINALYSIS DIPSTICK
Bilirubin, UA: NEGATIVE
Glucose, UA: NEGATIVE
LEUKOCYTES UA: NEGATIVE
NITRITE UA: NEGATIVE
RBC UA: NEGATIVE
Spec Grav, UA: 1.015
Urobilinogen, UA: 0.2
pH, UA: 6

## 2014-01-03 LAB — CBC WITH DIFFERENTIAL/PLATELET
BASOS ABS: 0 10*3/uL (ref 0.0–0.1)
Basophils Relative: 0.8 % (ref 0.0–3.0)
EOS ABS: 0.3 10*3/uL (ref 0.0–0.7)
Eosinophils Relative: 5 % (ref 0.0–5.0)
HEMATOCRIT: 46.6 % (ref 39.0–52.0)
Hemoglobin: 15.8 g/dL (ref 13.0–17.0)
LYMPHS ABS: 2.3 10*3/uL (ref 0.7–4.0)
Lymphocytes Relative: 40.8 % (ref 12.0–46.0)
MCHC: 33.9 g/dL (ref 30.0–36.0)
MCV: 90.1 fl (ref 78.0–100.0)
Monocytes Absolute: 0.4 10*3/uL (ref 0.1–1.0)
Monocytes Relative: 7.4 % (ref 3.0–12.0)
NEUTROS PCT: 46 % (ref 43.0–77.0)
Neutro Abs: 2.6 10*3/uL (ref 1.4–7.7)
PLATELETS: 275 10*3/uL (ref 150.0–400.0)
RBC: 5.17 Mil/uL (ref 4.22–5.81)
RDW: 13.4 % (ref 11.5–15.5)
WBC: 5.6 10*3/uL (ref 4.0–10.5)

## 2014-01-03 LAB — LIPID PANEL
CHOLESTEROL: 212 mg/dL — AB (ref 0–200)
HDL: 45.4 mg/dL (ref >39.00)
LDL Cholesterol: 145 mg/dL — ABNORMAL HIGH (ref 0–99)
NonHDL: 166.6
Total CHOL/HDL Ratio: 5
Triglycerides: 109 mg/dL (ref 0.0–149.0)
VLDL: 21.8 mg/dL (ref 0.0–40.0)

## 2014-01-03 LAB — HEPATIC FUNCTION PANEL
ALBUMIN: 4.5 g/dL (ref 3.5–5.2)
ALK PHOS: 119 U/L — AB (ref 39–117)
ALT: 18 U/L (ref 0–53)
AST: 21 U/L (ref 0–37)
Bilirubin, Direct: 0.3 mg/dL (ref 0.0–0.3)
Total Bilirubin: 2.8 mg/dL — ABNORMAL HIGH (ref 0.2–1.2)
Total Protein: 7 g/dL (ref 6.0–8.3)

## 2014-01-03 LAB — BASIC METABOLIC PANEL
BUN: 17 mg/dL (ref 6–23)
CO2: 30 mEq/L (ref 19–32)
Calcium: 9.8 mg/dL (ref 8.4–10.5)
Chloride: 103 mEq/L (ref 96–112)
Creatinine, Ser: 0.9 mg/dL (ref 0.4–1.5)
GFR: 93.88 mL/min (ref >60.00)
Glucose, Bld: 76 mg/dL (ref 70–99)
POTASSIUM: 4.7 meq/L (ref 3.5–5.1)
SODIUM: 139 meq/L (ref 135–145)

## 2014-01-03 LAB — PSA: PSA: 0.92 ng/mL (ref 0.10–4.00)

## 2014-01-03 LAB — TSH: TSH: 1.45 u[IU]/mL (ref 0.35–4.50)

## 2014-01-06 LAB — TESTOSTERONE, FREE, TOTAL, SHBG
SEX HORMONE BINDING: 31 nmol/L (ref 13–71)
TESTOSTERONE: 650 ng/dL (ref 300–890)
Testosterone, Free: 147.9 pg/mL (ref 47.0–244.0)
Testosterone-% Free: 2.3 % (ref 1.6–2.9)

## 2014-01-10 ENCOUNTER — Ambulatory Visit: Payer: BC Managed Care – PPO | Admitting: Internal Medicine

## 2014-01-24 ENCOUNTER — Encounter: Payer: Self-pay | Admitting: Internal Medicine

## 2014-01-24 ENCOUNTER — Ambulatory Visit (INDEPENDENT_AMBULATORY_CARE_PROVIDER_SITE_OTHER): Payer: BC Managed Care – PPO | Admitting: Internal Medicine

## 2014-01-24 DIAGNOSIS — R5381 Other malaise: Secondary | ICD-10-CM

## 2014-01-24 DIAGNOSIS — R5383 Other fatigue: Secondary | ICD-10-CM

## 2014-01-24 DIAGNOSIS — R5382 Chronic fatigue, unspecified: Secondary | ICD-10-CM

## 2014-01-24 DIAGNOSIS — E291 Testicular hypofunction: Secondary | ICD-10-CM

## 2014-01-24 HISTORY — DX: Gilbert syndrome: E80.4

## 2014-01-24 MED ORDER — MUPIROCIN 2 % EX OINT: TOPICAL_OINTMENT | CUTANEOUS | Status: DC

## 2014-01-24 MED ORDER — PRAMIPEXOLE DIHYDROCHLORIDE 0.5 MG PO TABS: 0.2500 mg | ORAL_TABLET | Freq: Every day | ORAL | Status: DC

## 2014-01-24 MED ORDER — TESTOSTERONE CYPIONATE 100 MG/ML IM SOLN: INTRAMUSCULAR | Status: DC

## 2014-01-24 NOTE — Assessment & Plan Note (Signed)
I suspect chronic fatigue may be related to history of multiple concussions.

## 2014-01-24 NOTE — Assessment & Plan Note (Signed)
Patient slightly overtreated. He has upper limit of normal hemoglobin and hematocrit. Reduce testosterone dose to 1.5 mL every 2 weeks. Recheck in 2 months. Lab Results  Component Value Date   PSA 0.92 01/03/2014   PSA 0.91 08/26/2013   PSA 0.91 11/16/2012

## 2014-01-24 NOTE — Progress Notes (Signed)
Subjective:    Patient ID: Isaiah Cordova, male    DOB: [birth year], 43 y.o.   MRN: [id number]  HPI  43 year old white male with history of hypogonadism, chronic fatigue and Gilbert's syndrome for routine followup.  Hypogonadism-patient currently taking 2 ML's of depo testosterone every 2 weeks. Patient reports his libido is normal. Reviewed his recent blood work. His hemoglobin and hematocrit is elevated (upper limits of normal). He has history of Gilbert's syndrome. His bilirubin levels are also elevated.  He complains of chronic lethargy/fatigue. However patient able to exercise on a regular basis. He denies any muscle weakness.  Review of Systems Negative for muscle weakness, chronic fatigue/lethargy    Past Medical History  Diagnosis Date  . Concussion   . Prostatitis   . Depression   . Hemorrhoids, external   . Rectal bleeding     history of, negative colonoscopy 2005-2006  . Headache(784.0)     chronic  . Irritable bowel     History   Social History  . Marital Status: Married    Spouse Name: [name]    Number of Children: 0  . Years of Education: N/A   Occupational History  . [employer]    Social History Main Topics  . Smoking status: Never Smoker   . Smokeless tobacco: Never Used  . Alcohol Use: Yes  . Drug Use: Not on file  . Sexual Activity: Not on file   Other Topics Concern  . Not on file   Social History Narrative   Regular exercise - yes    Past Surgical History  Procedure Laterality Date  . Hernia repair    . Nasal septum surgery    . Retinal detachment surgery      Family History  Problem Relation Age of Onset  . Hypertension Father   . Diabetes Father   . Hyperlipidemia Father   . Depression Maternal Grandfather     prostate  . Depression Paternal Grandfather     committed suicide  . Hypothyroidism Other     Allergies  Allergen Reactions  . Lisdexamfetamine     REACTION: chest pains    Current Outpatient  Prescriptions on File Prior to Visit  Medication Sig Dispense Refill  . latanoprost (XALATAN) 0.005 % ophthalmic solution Place 1 drop into both eyes daily.      Marland Kitchen NEEDLE, DISP, 20 G (BD DISP NEEDLES) 20G X 1" MISC 1 each by Does not apply route every 30 (thirty) days. To draw up med  100 each  11  . Syringe/Needle, Disp, (SYRINGE 3CC/22GX1-1/2") 22G X 1-1/2" 3 ML MISC 1 each by Does not apply route every 30 (thirty) days. To administer med  100 each  11  . timolol (TIMOPTIC) 0.25 % ophthalmic solution Place 1 drop into the left eye daily.        . travoprost, benzalkonium, (TRAVATAN) 0.004 % ophthalmic solution Place 1 drop into both eyes at bedtime.         No current facility-administered medications on file prior to visit.    BP 130/78  Pulse 64  Temp(Src) 98.5 F (36.9 C) (Oral)  Ht 6\' 2"  (1.88 m)  Wt 203 lb (92.08 kg)  BMI 26.05 kg/m2    Objective:   Physical Exam  Constitutional: He is oriented to person, place, and time. He appears well-developed and well-nourished. No distress.  HENT:  Head: Normocephalic and atraumatic.  Nose: Nose normal.  Cardiovascular: Normal rate, regular rhythm and normal heart  sounds.   No murmur heard. Pulmonary/Chest: Effort normal and breath sounds normal. He has no wheezes.  Musculoskeletal: He exhibits no edema.  Neurological: He is alert and oriented to person, place, and time. No cranial nerve deficit.  Skin: Skin is warm and dry.  Psychiatric: He has a normal mood and affect. His behavior is normal.          Assessment & Plan:

## 2014-01-24 NOTE — Assessment & Plan Note (Signed)
He has slight increase in total bilirubin. This is likely due to mild secondary polycythemia.

## 2014-01-24 NOTE — Progress Notes (Signed)
Pre visit review using our clinic review tool, if applicable. No additional management support is needed unless otherwise documented below in the visit note. 

## 2014-01-24 NOTE — Patient Instructions (Addendum)
Please complete the following lab tests before your next follow up appointment: CBCD, Total and Free Testosterone, LFTs - 257.2, 277.4

## 2014-05-20 ENCOUNTER — Other Ambulatory Visit (INDEPENDENT_AMBULATORY_CARE_PROVIDER_SITE_OTHER): Payer: BC Managed Care – PPO

## 2014-05-20 DIAGNOSIS — Z0000 Encounter for general adult medical examination without abnormal findings: Secondary | ICD-10-CM

## 2014-05-20 LAB — HEPATIC FUNCTION PANEL
ALT: 20 U/L (ref 0–53)
AST: 27 U/L (ref 0–37)
Albumin: 4.1 g/dL (ref 3.5–5.2)
Alkaline Phosphatase: 61 U/L (ref 39–117)
BILIRUBIN DIRECT: 0.1 mg/dL (ref 0.0–0.3)
BILIRUBIN TOTAL: 1.5 mg/dL — AB (ref 0.2–1.2)
Total Protein: 6.9 g/dL (ref 6.0–8.3)

## 2014-05-20 LAB — BASIC METABOLIC PANEL
BUN: 14 mg/dL (ref 6–23)
CO2: 24 mEq/L (ref 19–32)
CREATININE: 0.9 mg/dL (ref 0.4–1.5)
Calcium: 9 mg/dL (ref 8.4–10.5)
Chloride: 106 mEq/L (ref 96–112)
GFR: 98.6 mL/min (ref >60.00)
Glucose, Bld: 136 mg/dL — ABNORMAL HIGH (ref 70–99)
Potassium: 4.7 mEq/L (ref 3.5–5.1)
Sodium: 139 mEq/L (ref 135–145)

## 2014-05-20 LAB — CBC WITH DIFFERENTIAL/PLATELET
Basophils Absolute: 0.1 10*3/uL (ref 0.0–0.1)
Basophils Relative: 0.8 % (ref 0.0–3.0)
Eosinophils Absolute: 0.2 10*3/uL (ref 0.0–0.7)
Eosinophils Relative: 3.7 % (ref 0.0–5.0)
HCT: 43.7 % (ref 39.0–52.0)
Hemoglobin: 14.3 g/dL (ref 13.0–17.0)
LYMPHS PCT: 38.3 % (ref 12.0–46.0)
Lymphs Abs: 2.4 10*3/uL (ref 0.7–4.0)
MCHC: 32.7 g/dL (ref 30.0–36.0)
MCV: 91.6 fl (ref 78.0–100.0)
MONO ABS: 0.4 10*3/uL (ref 0.1–1.0)
MONOS PCT: 6 % (ref 3.0–12.0)
NEUTROS PCT: 51.2 % (ref 43.0–77.0)
Neutro Abs: 3.2 10*3/uL (ref 1.4–7.7)
PLATELETS: 292 10*3/uL (ref 150.0–400.0)
RBC: 4.77 Mil/uL (ref 4.22–5.81)
RDW: 13 % (ref 11.5–15.5)
WBC: 6.3 10*3/uL (ref 4.0–10.5)

## 2014-05-20 LAB — TSH: TSH: 1.15 u[IU]/mL (ref 0.35–4.50)

## 2014-05-20 LAB — LIPID PANEL
CHOLESTEROL: 199 mg/dL (ref 0–200)
HDL: 45.2 mg/dL (ref >39.00)
LDL Cholesterol: 138 mg/dL — ABNORMAL HIGH (ref 0–99)
NonHDL: 153.8
Total CHOL/HDL Ratio: 4
Triglycerides: 80 mg/dL (ref 0.0–149.0)
VLDL: 16 mg/dL (ref 0.0–40.0)

## 2014-05-20 LAB — POCT URINALYSIS DIPSTICK
BILIRUBIN UA: NEGATIVE
Blood, UA: NEGATIVE
Glucose, UA: NEGATIVE
KETONES UA: NEGATIVE
LEUKOCYTES UA: NEGATIVE
Nitrite, UA: NEGATIVE
PH UA: 6
Protein, UA: NEGATIVE
SPEC GRAV UA: 1.025
Urobilinogen, UA: 0.2

## 2014-05-26 ENCOUNTER — Other Ambulatory Visit: Payer: Self-pay | Admitting: *Deleted

## 2014-05-26 MED ORDER — "SYRINGE 22G X 1-1/2"" 3 ML MISC": 1.0000 | Status: DC

## 2014-05-26 MED ORDER — "NEEDLE (DISP) 20G X 1"" MISC": 1.0000 | Status: DC

## 2014-05-27 ENCOUNTER — Encounter: Payer: BC Managed Care – PPO | Admitting: Internal Medicine

## 2014-05-28 ENCOUNTER — Encounter: Payer: BC Managed Care – PPO | Admitting: Internal Medicine

## 2014-06-16 ENCOUNTER — Ambulatory Visit (INDEPENDENT_AMBULATORY_CARE_PROVIDER_SITE_OTHER): Payer: BLUE CROSS/BLUE SHIELD | Admitting: Internal Medicine

## 2014-06-16 ENCOUNTER — Encounter: Payer: Self-pay | Admitting: Internal Medicine

## 2014-06-16 VITALS — BP 112/74 | HR 77 | Temp 98.8°F | Ht 74.0 in | Wt 206.0 lb

## 2014-06-16 DIAGNOSIS — Z0000 Encounter for general adult medical examination without abnormal findings: Secondary | ICD-10-CM

## 2014-06-16 HISTORY — DX: Encounter for general adult medical examination without abnormal findings: Z00.00

## 2014-06-16 MED ORDER — TESTOSTERONE CYPIONATE 100 MG/ML IM SOLN: INTRAMUSCULAR | Status: DC

## 2014-06-16 MED ORDER — LIDOCAINE-HYDROCORTISONE ACE 3-0.5 % RE CREA: 1.0000 | TOPICAL_CREAM | Freq: Two times a day (BID) | RECTAL | Status: DC

## 2014-06-16 NOTE — Progress Notes (Signed)
Pre visit review using our clinic review tool, if applicable. No additional management support is needed unless otherwise documented below in the visit note. 

## 2014-06-16 NOTE — Progress Notes (Signed)
Subjective:    Patient ID: Isaiah Cordova, male    DOB: 09-08-1970, 44 y.o.   MRN: [id number]  HPI  44 year old white male with history of hypogonadism and restless leg syndrome for routine physical. Patient denies significant interval medical history. His weight is stable.  Social and family history reviewed and updated. Married.  His daughter is now 69.  No plans for more children.  Patient still works for [employer] in [employer].   Review of Systems   Constitutional: Negative for activity change, appetite change and unexpected weight change. chronic mild fatigue Eyes: Negative for visual disturbance.  Respiratory: Negative for cough, chest tightness and shortness of breath.  Cardiovascular: Negative for chest pain.  Genitourinary: Negative for difficulty urinating.  Neurological: Negative for headaches. ppor concentration (chronic)  Gastrointestinal: Negative for abdominal pain, heartburn melena or hematochezia Psych: Negative for depression or anxiety Endo:  Negative for sexual dysfunction        Past Medical History  Diagnosis Date  . Concussion   . Hemorrhoids, external   . Rectal bleeding     history of, negative colonoscopy 2005-2006  . Headache(784.0)     chronic  . Irritable bowel     History   Social History  . Marital Status: Married    Spouse Name: [name]    Number of Children: 1  . Years of Education: N/A   Occupational History  . [employer]    Social History Main Topics  . Smoking status: Never Smoker   . Smokeless tobacco: Never Used  . Alcohol Use: Yes  . Drug Use: Not on file  . Sexual Activity: Not on file   Other Topics Concern  . Not on file   Social History Narrative   Regular exercise - yes   1 daughter 6 years old    Past Surgical History  Procedure Laterality Date  . Hernia repair    . Nasal septum surgery    . Retinal detachment surgery      Family History  Problem Relation Age of Onset  . Hypertension Father     . Diabetes Father   . Hyperlipidemia Father   . Depression Maternal Grandfather     prostate  . Depression Paternal Grandfather     committed suicide  . Hypothyroidism Other   . Diabetes Mellitus II Brother   . Prostate cancer Father     Allergies  Allergen Reactions  . Lisdexamfetamine     REACTION: chest pains    Current Outpatient Prescriptions on File Prior to Visit  Medication Sig Dispense Refill  . latanoprost (XALATAN) 0.005 % ophthalmic solution Place 1 drop into both eyes daily.    . mupirocin ointment (BACTROBAN) 2 % Apply two times a day to nares x 2 weeks 22 g 2  . NEEDLE, DISP, 20 G (BD DISP NEEDLES) 20G X 1" MISC 1 each by Does not apply route every 30 (thirty) days. To draw up med 100 each 11  . Syringe/Needle, Disp, (SYRINGE 3CC/22GX1-1/2") 22G X 1-1/2" 3 ML MISC 1 each by Does not apply route every 30 (thirty) days. To administer med 100 each 11  . timolol (TIMOPTIC) 0.25 % ophthalmic solution Place 1 drop into the left eye daily.      . travoprost, benzalkonium, (TRAVATAN) 0.004 % ophthalmic solution Place 1 drop into both eyes at bedtime.       No current facility-administered medications on file prior to visit.    BP 112/74 mmHg  Pulse 77  Temp(Src) 98.8 F (37.1 C) (Oral)  Ht 6\' 2"  (1.88 m)  Wt 206 lb (93.441 kg)  BMI 26.44 kg/m2    Objective:   Physical Exam  Constitutional: He is oriented to person, place, and time. He appears well-developed and well-nourished. No distress.  HENT:  Head: Normocephalic and atraumatic.  Right Ear: External ear normal.  Left Ear: External ear normal.  Mouth/Throat: Oropharynx is clear and moist.  Eyes: EOM are normal. Pupils are equal, round, and reactive to light.  Neck: Normal range of motion. Neck supple.  No carotid bruit  Cardiovascular: Normal rate, regular rhythm and normal heart sounds.   No murmur heard. Pulmonary/Chest: Effort normal and breath sounds normal. He has no wheezes.  Abdominal: Bowel  sounds are normal. He exhibits no mass. There is no tenderness.  Genitourinary: Prostate normal.  External hemorrhoids  Musculoskeletal: He exhibits no edema.  Lymphadenopathy:    He has no cervical adenopathy.  Neurological: He is alert and oriented to person, place, and time. No cranial nerve deficit.  Skin: Skin is warm and dry.  Psychiatric: He has a normal mood and affect. His behavior is normal.          Assessment & Plan:

## 2014-06-16 NOTE — Patient Instructions (Signed)
Please complete the following lab tests before your next follow up appointment: Total and Free testosterone, PSA - use hypogonadism code

## 2014-06-16 NOTE — Assessment & Plan Note (Signed)
Reviewed adult health maintenance protocols.  Screening blood work reviewed with patient in detail.  Patient encouraged to follow low saturated fat diet and exercising regular basis. He is up-to-date with adult vaccines.  Prostate cancer screening performed considering patient's family history of prostate cancer and  he is on testosterone replacement therapy for hypogonadism. His digital rectal exam was unremarkable. Continue to monitor PSA every 6 months.  He declines flu vaccine.  Lab Results  Component Value Date   PSA 0.92 01/03/2014   PSA 0.91 08/26/2013   PSA 0.91 11/16/2012

## 2014-12-03 ENCOUNTER — Other Ambulatory Visit (HOSPITAL_COMMUNITY): Payer: Self-pay | Admitting: Chiropractic Medicine

## 2014-12-03 ENCOUNTER — Ambulatory Visit (HOSPITAL_COMMUNITY)
Admission: RE | Admit: 2014-12-03 | Discharge: 2014-12-03 | Disposition: A | Discharge status: Home / Self Care | Payer: BLUE CROSS/BLUE SHIELD | Source: Ambulatory Visit | Attending: Chiropractic Medicine | Admitting: Chiropractic Medicine

## 2014-12-03 DIAGNOSIS — M545 Low back pain: Secondary | ICD-10-CM | POA: Diagnosis not present

## 2014-12-03 DIAGNOSIS — M549 Dorsalgia, unspecified: Secondary | ICD-10-CM

## 2014-12-05 ENCOUNTER — Other Ambulatory Visit: Payer: BLUE CROSS/BLUE SHIELD

## 2014-12-12 ENCOUNTER — Ambulatory Visit: Payer: BLUE CROSS/BLUE SHIELD | Admitting: Internal Medicine

## 2014-12-26 ENCOUNTER — Other Ambulatory Visit (INDEPENDENT_AMBULATORY_CARE_PROVIDER_SITE_OTHER): Payer: BLUE CROSS/BLUE SHIELD

## 2014-12-26 DIAGNOSIS — E291 Testicular hypofunction: Secondary | ICD-10-CM

## 2014-12-29 LAB — TESTOSTERONE, FREE, TOTAL, SHBG
Sex Hormone Binding: 31 nmol/L (ref 10–50)
TESTOSTERONE: 234 ng/dL — AB (ref 300–890)
Testosterone, Free: 46.7 pg/mL — ABNORMAL LOW (ref 47.0–244.0)
Testosterone-% Free: 2 % (ref 1.6–2.9)

## 2015-01-02 ENCOUNTER — Ambulatory Visit (INDEPENDENT_AMBULATORY_CARE_PROVIDER_SITE_OTHER): Payer: BLUE CROSS/BLUE SHIELD | Admitting: Internal Medicine

## 2015-01-02 ENCOUNTER — Encounter: Payer: Self-pay | Admitting: Internal Medicine

## 2015-01-02 VITALS — BP 108/80 | HR 86 | Temp 98.4°F | Ht 74.0 in | Wt 207.3 lb

## 2015-01-02 DIAGNOSIS — G4700 Insomnia, unspecified: Secondary | ICD-10-CM

## 2015-01-02 DIAGNOSIS — E291 Testicular hypofunction: Secondary | ICD-10-CM

## 2015-01-02 DIAGNOSIS — R413 Other amnesia: Secondary | ICD-10-CM

## 2015-01-02 DIAGNOSIS — F5104 Psychophysiologic insomnia: Secondary | ICD-10-CM

## 2015-01-02 MED ORDER — TESTOSTERONE CYPIONATE 100 MG/ML IM SOLN: INTRAMUSCULAR | Status: DC

## 2015-01-02 NOTE — Progress Notes (Signed)
Subjective:    Patient ID: Isaiah Cordova, male    DOB: Dec 21, 1970, 44 y.o.   MRN: [id number]  HPI  44 year old white male with history of hypogonadism, chronic fatigue and memory loss for follow-up.  Interval medical history-patient recently seen by ophthalmologist for multiple styes in right eye. His symptoms improving after starting doxycycline.  Hypogonadism-his most recent testosterone level was decreased. However, he reports he hadn't used testosterone IM injection for 1 month.  Patient has had okay with short-term memory loss for several years. His previous MRI was unremarkable. His issues with short-term memory presumed secondary to history of multiple concussions. He is currently taking an over-the-counter supplement for memory.  Review of Systems Negative for sexual dysfunction, poor sleep quality (chronic insomnia)    Past Medical History  Diagnosis Date  . Concussion   . Hemorrhoids, external   . Rectal bleeding     history of, negative colonoscopy 2005-2006  . Headache(784.0)     chronic  . Irritable bowel     History   Social History  . Marital Status: Married    Spouse Name: [name]  . Number of Children: 1  . Years of Education: N/A   Occupational History  . [employer]    Social History Main Topics  . Smoking status: Never Smoker   . Smokeless tobacco: Never Used  . Alcohol Use: Yes  . Drug Use: Not on file  . Sexual Activity: Not on file   Other Topics Concern  . Not on file   Social History Narrative   Regular exercise - yes   1 daughter 24 years old    Past Surgical History  Procedure Laterality Date  . Hernia repair    . Nasal septum surgery    . Retinal detachment surgery      Family History  Problem Relation Age of Onset  . Hypertension Father   . Diabetes Father   . Hyperlipidemia Father   . Depression Maternal Grandfather     prostate  . Depression Paternal Grandfather     committed suicide  . Hypothyroidism  Other   . Diabetes Mellitus II Brother   . Prostate cancer Father     Allergies  Allergen Reactions  . Lisdexamfetamine     REACTION: chest pains    Current Outpatient Prescriptions on File Prior to Visit  Medication Sig Dispense Refill  . latanoprost (XALATAN) 0.005 % ophthalmic solution Place 1 drop into both eyes daily.    Marland Kitchen lidocaine-hydrocortisone (ANAMANTEL HC) 3-0.5 % CREA Place 1 Applicatorful rectally 2 (two) times daily. 28.3 g 3  . mupirocin ointment (BACTROBAN) 2 % Apply two times a day to nares x 2 weeks 22 g 2  . NEEDLE, DISP, 20 G (BD DISP NEEDLES) 20G X 1" MISC 1 each by Does not apply route every 30 (thirty) days. To draw up med 100 each 11  . Syringe/Needle, Disp, (SYRINGE 3CC/22GX1-1/2") 22G X 1-1/2" 3 ML MISC 1 each by Does not apply route every 30 (thirty) days. To administer med 100 each 11  . testosterone cypionate (DEPOTESTOTERONE CYPIONATE) 100 MG/ML injection Inject 1.5 mL every 14 days.  For IM use only 10 mL 5  . timolol (TIMOPTIC) 0.25 % ophthalmic solution Place 1 drop into the left eye daily.      . travoprost, benzalkonium, (TRAVATAN) 0.004 % ophthalmic solution Place 1 drop into both eyes at bedtime.       No current facility-administered medications on file prior  to visit.    BP 108/80 mmHg  Pulse 86  Temp(Src) 98.4 F (36.9 C) (Oral)  Ht 6\' 2"  (1.88 m)  Wt 207 lb 4.8 oz (94.031 kg)  BMI 26.60 kg/m2    Objective:   Physical Exam  Constitutional: He is oriented to person, place, and time. He appears well-developed and well-nourished.  HENT:  Head: Normocephalic and atraumatic.  Eyes: EOM are normal. Pupils are equal, round, and reactive to light.  Red over over right upper and lower eye lid  Cardiovascular: Normal rate, regular rhythm and normal heart sounds.   No murmur heard. Pulmonary/Chest: Effort normal and breath sounds normal.  Neurological: He is alert and oriented to person, place, and time. No cranial nerve deficit.    Psychiatric: He has a normal mood and affect. His behavior is normal.        Assessment & Plan:   1. Hypogonadism 2. Short term memory loss 3. Chronic insomnia  Patient's recent testosterone level was below normal range. However, he didn't use IM testosterone injection in 1 month.  He was advised to resume normal schedule.  Monitor testosterone levels and PSA before next office visit.  Patient's short-term memory loss likely due to history of numerous concussions.  We discussed that his over-the-counter supplement likely has limited efficacy.    Good sleep hygiene encouraged.  He could not tolerate amitriptyline or gabapentin in the past.

## 2015-01-02 NOTE — Patient Instructions (Addendum)
Schedule next office visit as CPX Follow good sleep hygiene Contact our office if your back pain does not improve Please complete the following lab tests before your next follow up appointment: Free and total testosterone, PSA - use hypogonadism code BMET - [id number]

## 2015-01-02 NOTE — Progress Notes (Signed)
Pre visit review using our clinic review tool, if applicable. No additional management support is needed unless otherwise documented below in the visit note. 

## 2015-02-10 ENCOUNTER — Encounter: Payer: Self-pay | Admitting: Internal Medicine

## 2015-02-10 ENCOUNTER — Ambulatory Visit (INDEPENDENT_AMBULATORY_CARE_PROVIDER_SITE_OTHER): Payer: BLUE CROSS/BLUE SHIELD | Admitting: Internal Medicine

## 2015-02-10 VITALS — BP 126/80 | HR 72 | Temp 98.6°F | Resp 20 | Ht 74.0 in | Wt 204.0 lb

## 2015-02-10 DIAGNOSIS — W57XXXA Bitten or stung by nonvenomous insect and other nonvenomous arthropods, initial encounter: Secondary | ICD-10-CM

## 2015-02-10 DIAGNOSIS — T148 Other injury of unspecified body region: Secondary | ICD-10-CM | POA: Diagnosis not present

## 2015-02-10 DIAGNOSIS — T63421A Toxic effect of venom of ants, accidental (unintentional), initial encounter: Secondary | ICD-10-CM

## 2015-02-10 MED ORDER — TRIAMCINOLONE ACETONIDE 0.1 % EX CREA: 1.0000 "application " | TOPICAL_CREAM | Freq: Two times a day (BID) | CUTANEOUS | Status: DC

## 2015-02-10 NOTE — Patient Instructions (Signed)
[employer]  A fire ant bite appears as a red lump in the skin. It sometimes has a tiny hole in the center. Reactions to these bites can be severe. A severe reaction is called an anaphylactic reaction. With a severe reaction there may be symptoms of wheezing or difficulty breathing, chest pain, fainting and raised red patches on the skin (hives) that itch. There may also be nausea (feeling sick to your stomach), vomiting, cramping or diarrhea. Usually after 24 hours a small sterile pustule (a tiny sac in the skin filled with pus but no germs) develops. There may be itching, burning, and redness. HOME CARE INSTRUCTIONS   Apply a cold compress for 10 to 20 minutes every hour for 1 to 2 days. This will reduce swelling and itching.  After 24 to 48 hours, a warm compress may be soothing and will help decrease swelling.  To relieve itching and swelling, you may use:  Diphenhydramine, available over-the-counter. Take medicine as directed. Do not drink alcohol or drive while taking this medicine.  Hydrocortisone cream may be applied lightly 4 times per day for a couple days or as directed.  Calamine lotion with diphenhydramine may be used lightly on the bite 4 times per day for itching or as directed. Do not take with oral diphenhydramine.  Only take over-the-counter or prescription medicines for pain, discomfort, or fever as directed by your caregiver. SEEK MEDICAL CARE IF:   None of the above helps within 2 to 3 days.  The area becomes red, warm, tender, and swollen beyond the area of the bite or sting. SEEK IMMEDIATE MEDICAL CARE IF:   You have a fever.  You have symptoms of an allergic reaction (wheezing or difficulty breathing).  You develop chest pain, fainting, or raised red patches on the skin that itch.  You develop nausea, vomiting, cramping, or diarrhea. These may be early signs of a serious generalized or anaphylactic reaction. MAKE SURE YOU:   Understand these  instructions.  Will watch your condition.  Will get help right away if you are not doing well or get worse. Document Released: 02/08/2001 Document Revised: 08/08/2011 Document Reviewed: 05/07/2008 [facility] Patient Information 2015 [place], [place]. This information is not intended to replace advice given to you by your health care provider. Make sure you discuss any questions you have with your health care provider.

## 2015-02-10 NOTE — Progress Notes (Signed)
Pre visit review using our clinic review tool, if applicable. No additional management support is needed unless otherwise documented below in the visit note. 

## 2015-02-10 NOTE — Progress Notes (Signed)
Subjective:    Patient ID: Isaiah Cordova, male    DOB: 01-04-1971, 44 y.o.   MRN: [id number]  HPI  44 year old patient who is seen today for evaluation and treatment of multiple and bites involving the ankles of both legs.  He has been using topical hydrocortisone.  He is a soft tissue swelling and considerable itching.  Both symptoms are improved over the past 24 hours.  Past Medical History  Diagnosis Date  . Concussion   . Hemorrhoids, external   . Rectal bleeding     history of, negative colonoscopy 2005-2006  . Headache(784.0)     chronic  . Irritable bowel     Social History   Social History  . Marital Status: Married    Spouse Name: [name]  . Number of Children: 1  . Years of Education: N/A   Occupational History  . [employer]    Social History Main Topics  . Smoking status: Never Smoker   . Smokeless tobacco: Never Used  . Alcohol Use: Yes  . Drug Use: Not on file  . Sexual Activity: Not on file   Other Topics Concern  . Not on file   Social History Narrative   Regular exercise - yes   1 daughter 7 years old    Past Surgical History  Procedure Laterality Date  . Hernia repair    . Nasal septum surgery    . Retinal detachment surgery      Family History  Problem Relation Age of Onset  . Hypertension Father   . Diabetes Father   . Hyperlipidemia Father   . Depression Maternal Grandfather     prostate  . Depression Paternal Grandfather     committed suicide  . Hypothyroidism Other   . Diabetes Mellitus II Brother   . Prostate cancer Father     Allergies  Allergen Reactions  . Lisdexamfetamine     REACTION: chest pains    Current Outpatient Prescriptions on File Prior to Visit  Medication Sig Dispense Refill  . NEEDLE, DISP, 20 G (BD DISP NEEDLES) 20G X 1" MISC 1 each by Does not apply route every 30 (thirty) days. To draw up med 100 each 11  . Syringe/Needle, Disp, (SYRINGE 3CC/22GX1-1/2") 22G X 1-1/2" 3 ML MISC 1 each by  Does not apply route every 30 (thirty) days. To administer med 100 each 11  . testosterone cypionate (DEPOTESTOTERONE CYPIONATE) 100 MG/ML injection Inject 1.5 mL every 14 days.  For IM use only 10 mL 5  . timolol (TIMOPTIC) 0.25 % ophthalmic solution Place 1 drop into the left eye daily.      . travoprost, benzalkonium, (TRAVATAN) 0.004 % ophthalmic solution Place 1 drop into both eyes at bedtime.       No current facility-administered medications on file prior to visit.    BP 126/80 mmHg  Pulse 72  Temp(Src) 98.6 F (37 C) (Oral)  Resp 20  Ht 6\' 2"  (1.88 m)  Wt 204 lb (92.534 kg)  BMI 26.18 kg/m2  SpO2 98%     Review of Systems  Skin: Positive for rash.       Objective:   Physical Exam  Constitutional: He appears well-developed and well-nourished. No distress.  Afebrile  Skin:  Scattered erythematous papular lesions involving both ankles most marked involving the right medial ankle.  There is some mild soft tissue swelling involving the ankle on the right          Assessment &  Plan:   Multiple fire ant bites.  Will continue antihistamines and local topical steroids

## 2015-02-12 ENCOUNTER — Other Ambulatory Visit: Payer: Self-pay | Admitting: Internal Medicine

## 2015-02-23 ENCOUNTER — Other Ambulatory Visit: Payer: Self-pay | Admitting: *Deleted

## 2015-02-23 MED ORDER — TESTOSTERONE CYPIONATE 100 MG/ML IM SOLN: INTRAMUSCULAR | Status: DC

## 2015-06-21 ENCOUNTER — Encounter (HOSPITAL_BASED_OUTPATIENT_CLINIC_OR_DEPARTMENT_OTHER): Payer: Self-pay | Admitting: *Deleted

## 2015-06-21 ENCOUNTER — Other Ambulatory Visit: Payer: Self-pay

## 2015-06-21 ENCOUNTER — Emergency Department (HOSPITAL_BASED_OUTPATIENT_CLINIC_OR_DEPARTMENT_OTHER)
Admission: EM | Admit: 2015-06-21 | Discharge: 2015-06-22 | Disposition: A | Discharge status: Home / Self Care | Payer: BLUE CROSS/BLUE SHIELD | Attending: Emergency Medicine | Admitting: Emergency Medicine

## 2015-06-21 DIAGNOSIS — Z79899 Other long term (current) drug therapy: Secondary | ICD-10-CM | POA: Diagnosis not present

## 2015-06-21 DIAGNOSIS — G8929 Other chronic pain: Secondary | ICD-10-CM | POA: Diagnosis not present

## 2015-06-21 DIAGNOSIS — Z9889 Other specified postprocedural states: Secondary | ICD-10-CM | POA: Diagnosis not present

## 2015-06-21 DIAGNOSIS — A084 Viral intestinal infection, unspecified: Secondary | ICD-10-CM | POA: Insufficient documentation

## 2015-06-21 DIAGNOSIS — Z87828 Personal history of other (healed) physical injury and trauma: Secondary | ICD-10-CM | POA: Insufficient documentation

## 2015-06-21 DIAGNOSIS — R064 Hyperventilation: Secondary | ICD-10-CM | POA: Diagnosis not present

## 2015-06-21 DIAGNOSIS — R112 Nausea with vomiting, unspecified: Secondary | ICD-10-CM | POA: Diagnosis present

## 2015-06-21 DIAGNOSIS — F419 Anxiety disorder, unspecified: Secondary | ICD-10-CM | POA: Insufficient documentation

## 2015-06-21 HISTORY — DX: Gastric ulcer, unspecified as acute or chronic, without hemorrhage or perforation: K25.9

## 2015-06-21 LAB — COMPREHENSIVE METABOLIC PANEL
ALK PHOS: 65 U/L (ref 38–126)
ALT: 18 U/L (ref 17–63)
ANION GAP: 14 (ref 5–15)
AST: 30 U/L (ref 15–41)
Albumin: 4.8 g/dL (ref 3.5–5.0)
BILIRUBIN TOTAL: 2.5 mg/dL — AB (ref 0.3–1.2)
BUN: 17 mg/dL (ref 6–20)
CHLORIDE: 106 mmol/L (ref 101–111)
CO2: 20 mmol/L — AB (ref 22–32)
Calcium: 9.1 mg/dL (ref 8.9–10.3)
Creatinine, Ser: 1.1 mg/dL (ref 0.61–1.24)
GLUCOSE: 158 mg/dL — AB (ref 65–99)
Potassium: 4.4 mmol/L (ref 3.5–5.1)
SODIUM: 140 mmol/L (ref 135–145)
Total Protein: 7.4 g/dL (ref 6.5–8.1)

## 2015-06-21 LAB — CBC
HEMATOCRIT: 46.4 % (ref 39.0–52.0)
HEMOGLOBIN: 16 g/dL (ref 13.0–17.0)
MCH: 29.8 pg (ref 26.0–34.0)
MCHC: 34.5 g/dL (ref 30.0–36.0)
MCV: 86.4 fL (ref 78.0–100.0)
Platelets: 266 10*3/uL (ref 150–400)
RBC: 5.37 MIL/uL (ref 4.22–5.81)
RDW: 13 % (ref 11.5–15.5)
WBC: 12 10*3/uL — ABNORMAL HIGH (ref 4.0–10.5)

## 2015-06-21 MED ORDER — ONDANSETRON HCL 4 MG/2ML IJ SOLN
4.0000 mg | Freq: Once | INTRAMUSCULAR | Status: AC | PRN
  Administered 2015-06-21: 4 mg via INTRAVENOUS
  Filled 2015-06-21: qty 2

## 2015-06-21 MED ORDER — LORAZEPAM 2 MG/ML IJ SOLN
1.0000 mg | Freq: Once | INTRAMUSCULAR | Status: AC
  Administered 2015-06-21: 1 mg via INTRAVENOUS
  Filled 2015-06-21: qty 1

## 2015-06-21 NOTE — ED Notes (Signed)
Pt continues to hyperventilate. 02 sats remain 95-100% Pt encouraged to slow breathing. States he feels like he is having a panic attack. MD aware and orders received.

## 2015-06-21 NOTE — ED Notes (Signed)
Pt states that he started vomiting around 6pm after eating dinner. States his daughter has been vomiting as well. States he has vomited around "26 times" states he has had many episodes of diarrhea. Also c/o anterior chest tightness that feels like "heartburn" c/o chills. States she feels sob as well. Pt's sats are 100% on RA pt encouraged to slow his breathing at triage. Pt c/o abd cramping.

## 2015-06-22 ENCOUNTER — Encounter (HOSPITAL_BASED_OUTPATIENT_CLINIC_OR_DEPARTMENT_OTHER): Payer: Self-pay | Admitting: Emergency Medicine

## 2015-06-22 LAB — URINALYSIS, ROUTINE W REFLEX MICROSCOPIC
BILIRUBIN URINE: NEGATIVE
Glucose, UA: NEGATIVE mg/dL
Hgb urine dipstick: NEGATIVE
KETONES UR: 40 mg/dL — AB
LEUKOCYTES UA: NEGATIVE
NITRITE: NEGATIVE
Protein, ur: NEGATIVE mg/dL
Specific Gravity, Urine: 1.022 (ref 1.005–1.030)
pH: 8 (ref 5.0–8.0)

## 2015-06-22 MED ORDER — PANTOPRAZOLE SODIUM 40 MG IV SOLR
40.0000 mg | Freq: Once | INTRAVENOUS | Status: AC
  Administered 2015-06-22: 40 mg via INTRAVENOUS
  Filled 2015-06-22: qty 40

## 2015-06-22 MED ORDER — SODIUM CHLORIDE 0.9 % IV SOLN
Freq: Once | INTRAVENOUS | Status: AC
  Administered 2015-06-22: via INTRAVENOUS

## 2015-06-22 MED ORDER — SODIUM CHLORIDE 0.9 % IV BOLUS (SEPSIS)
2000.0000 mL | Freq: Once | INTRAVENOUS | Status: AC
  Administered 2015-06-22: 2000 mL via INTRAVENOUS

## 2015-06-22 MED ORDER — ONDANSETRON 8 MG PO TBDP: 8.0000 mg | ORAL_TABLET | Freq: Three times a day (TID) | ORAL | Status: DC | PRN

## 2015-06-22 NOTE — ED Provider Notes (Addendum)
CSN: [id number]     Arrival date & time 06/21/15  2301 History  By signing my name below, I, Helane Gunther, attest that this documentation has been prepared under the direction and in the presence of Shanon Rosser, MD. Electronically Signed: Helane Gunther, ED Scribe. 06/22/2015. 12:56 AM.     Chief Complaint  Patient presents with  . Vomiting    The history is provided by the patient and the spouse. No language interpreter was used.   HPI Comments: Isaiah Cordova is a 45 y.o. male who presents to the Emergency Department complaining of nausea, vomiting and diarrhea onset 6 hours ago. It began as diarrhea 5 minutes after startig to eat dinner, after which pt began vomiting as well. There is associated abdominal cramping that was more severe earlier but is improving.  He reports cramping abdominal pain and loose stools for the past 2 weeks as well as heart burn, radiating into the back, which has been relieved with Tums or Rolaids. He notes his daughter was sick for the past 3 days with a similar, but less severe, illness.  The patient's wife indicates he is easily stressed and tends to get anxious. Per charge nurse, pt was found to be hyperventilating and anxious on arrival, which was improved with IV Ativan. Nausea was improved with IV fluids and Zofran.   Past Medical History  Diagnosis Date  . Concussion   . Hemorrhoids, external   . Rectal bleeding     history of, negative colonoscopy 2005-2006  . Headache(784.0)     chronic  . Irritable bowel    Past Surgical History  Procedure Laterality Date  . Hernia repair    . Nasal septum surgery    . Retinal detachment surgery     Family History  Problem Relation Age of Onset  . Hypertension Father   . Diabetes Father   . Hyperlipidemia Father   . Depression Maternal Grandfather     prostate  . Depression Paternal Grandfather     committed suicide  . Hypothyroidism Other   . Diabetes Mellitus II Brother   . Prostate cancer  Father    Social History  Substance Use Topics  . Smoking status: Never Smoker   . Smokeless tobacco: Never Used  . Alcohol Use: Yes     Comment: occasional     Review of Systems  All other systems reviewed and are negative.   Allergies  Lisdexamfetamine  Home Medications   Prior to Admission medications   Medication Sig Start Date End Date Taking? Authorizing Provider  NEEDLE, DISP, 20 G (BD DISP NEEDLES) 20G X 1" MISC 1 each by Does not apply route every 30 (thirty) days. To draw up med 05/26/14   Doe-Hyun R Shawna Orleans, DO  Syringe/Needle, Disp, (SYRINGE 3CC/22GX1-1/2") 22G X 1-1/2" 3 ML MISC 1 each by Does not apply route every 30 (thirty) days. To administer med 05/26/14   Doe-Hyun R Shawna Orleans, DO  testosterone cypionate (DEPOTESTOTERONE CYPIONATE) 100 MG/ML injection Inject 1.5 mL every 14 days.  For IM use only 02/23/15   Doe-Hyun R Shawna Orleans, DO  timolol (TIMOPTIC) 0.25 % ophthalmic solution Place 1 drop into the left eye daily.      Historical Provider, MD  travoprost, benzalkonium, (TRAVATAN) 0.004 % ophthalmic solution Place 1 drop into both eyes at bedtime.      Historical Provider, MD  triamcinolone cream (KENALOG) 0.1 % Apply 1 application topically 2 (two) times daily. 02/10/15   Marletta Lor, MD  BP 118/68 mmHg  Pulse 103  Temp(Src) 98.7 F (37.1 C) (Oral)  Resp 20  Ht 6\' 2"  (1.88 m)  Wt 205 lb (92.987 kg)  BMI 26.31 kg/m2  SpO2 97%   Physical Exam General: Well-developed, well-nourished male in no acute distress; appearance consistent with age of record HENT: normocephalic; atraumatic Eyes: pupils equal, round and reactive to light; extraocular muscles intact Neck: supple Heart: regular rate and rhythm Lungs: clear to auscultation bilaterally Abdomen: soft; nondistended; nontender; no masses or hepatosplenomegaly; bowel sounds present Extremities: No deformity; full range of motion; pulses normal Neurologic: Awake, alert and oriented; motor function intact in all  extremities and symmetric; no facial droop Skin: Warm and dry Psychiatric: Normal mood and affect   ED Course  Procedures   MDM   Nursing notes and vitals signs, including pulse oximetry, reviewed.  Summary of this visit's results, reviewed by myself:   EKG Interpretation  Date/Time:  Sunday June 21 2015 23:09:02 EST Ventricular Rate:  113 PR Interval:  152 QRS Duration: 94 QT Interval:  342 QTC Calculation: 469 R Axis:   -129 Text Interpretation:  Sinus tachycardia Right superior axis deviation Cannot rule out Anterior infarct , age undetermined Abnormal ECG No previous ECGs available Confirmed by Vicki Pasqual  MD, Jenny Reichmann ([id number]) on 06/22/2015 2:02:15 AM       Labs:  Results for orders placed or performed during the hospital encounter of 06/21/15 (from the past 24 hour(s))  Comprehensive metabolic panel     Status: Abnormal   Collection Time: 06/21/15 11:36 PM  Result Value Ref Range   Sodium 140 135 - 145 mmol/L   Potassium 4.4 3.5 - 5.1 mmol/L   Chloride 106 101 - 111 mmol/L   CO2 20 (L) 22 - 32 mmol/L   Glucose, Bld 158 (H) 65 - 99 mg/dL   BUN 17 6 - 20 mg/dL   Creatinine, Ser 1.10 0.61 - 1.24 mg/dL   Calcium 9.1 8.9 - 10.3 mg/dL   Total Protein 7.4 6.5 - 8.1 g/dL   Albumin 4.8 3.5 - 5.0 g/dL   AST 30 15 - 41 U/L   ALT 18 17 - 63 U/L   Alkaline Phosphatase 65 38 - 126 U/L   Total Bilirubin 2.5 (H) 0.3 - 1.2 mg/dL   GFR calc non Af Amer >60 >60 mL/min   GFR calc Af Amer >60 >60 mL/min   Anion gap 14 5 - 15  CBC     Status: Abnormal   Collection Time: 06/21/15 11:36 PM  Result Value Ref Range   WBC 12.0 (H) 4.0 - 10.5 K/uL   RBC 5.37 4.22 - 5.81 MIL/uL   Hemoglobin 16.0 13.0 - 17.0 g/dL   HCT 46.4 39.0 - 52.0 %   MCV 86.4 78.0 - 100.0 fL   MCH 29.8 26.0 - 34.0 pg   MCHC 34.5 30.0 - 36.0 g/dL   RDW 13.0 11.5 - 15.5 %   Platelets 266 150 - 400 K/uL  Urinalysis, Routine w reflex microscopic (not at [facility])     Status: Abnormal   Collection Time: 06/22/15  1:10 AM   Result Value Ref Range   Color, Urine AMBER (A) YELLOW   APPearance CLEAR CLEAR   Specific Gravity, Urine 1.022 1.005 - 1.030   pH 8.0 5.0 - 8.0   Glucose, UA NEGATIVE NEGATIVE mg/dL   Hgb urine dipstick NEGATIVE NEGATIVE   Bilirubin Urine NEGATIVE NEGATIVE   Ketones, ur 40 (A) NEGATIVE mg/dL   Protein, ur  NEGATIVE NEGATIVE mg/dL   Nitrite NEGATIVE NEGATIVE   Leukocytes, UA NEGATIVE NEGATIVE   1:58 AM Drinking fluids without emesis. Nausea has resolved. Pain has improved.   I personally performed the services described in this documentation, which was scribed in my presence. The recorded information has been reviewed and is accurate.   Shanon Rosser, MD 06/22/15 [id number]  Shanon Rosser, MD 06/22/15 [id number]

## 2015-06-22 NOTE — ED Notes (Addendum)
Dr. Florina Ou into room at [facility]. Pt alert, NAD, calmer, hyperventilation resolved, resps e/u, speaking in clear complete sentences, VSS, no dyspnea noted, wife at [facility]. IVF bolus infusing, site U.

## 2015-06-22 NOTE — ED Notes (Signed)
Dr. Molpus into room 

## 2015-06-22 NOTE — Discharge Instructions (Signed)

## 2015-06-23 ENCOUNTER — Encounter: Payer: Self-pay | Admitting: Family Medicine

## 2015-06-23 ENCOUNTER — Ambulatory Visit (INDEPENDENT_AMBULATORY_CARE_PROVIDER_SITE_OTHER): Payer: BLUE CROSS/BLUE SHIELD | Admitting: Family Medicine

## 2015-06-23 VITALS — BP 110/78 | HR 80 | Temp 98.2°F | Ht 74.0 in | Wt 197.3 lb

## 2015-06-23 DIAGNOSIS — K529 Noninfective gastroenteritis and colitis, unspecified: Secondary | ICD-10-CM | POA: Diagnosis not present

## 2015-06-23 NOTE — Progress Notes (Signed)
Pre visit review using our clinic review tool, if applicable. No additional management support is needed unless otherwise documented below in the visit note. 

## 2015-06-23 NOTE — Progress Notes (Signed)
HPI:  Isaiah Cordova is a pleasant 45 you M patient of Dr. Shawna Orleans with a PMH significant for IBS, Gilbert's, Chronic fatigue, hemorrhoids and gastric ulcer here for an acute visit for follow up for gastroenteritis. He was seen at [facility] 2 days ago for diarrhea, nausea and vomiting - stomach bug going through the house with daughter with the milder symptoms.Today reports no emesis since ER visit, diarrhea resolving and abd pain much improved. Denies: fevers, chills, hematochezia, melena, persistent emesis, persistent abd pain. He does still have some burping at times, occ abd discomfort and had two loose bowels today.   ROS: See pertinent positives and negatives per HPI.  Past Medical History  Diagnosis Date  . Concussion   . Hemorrhoids, external   . Rectal bleeding     history of, negative colonoscopy 2005-2006  . Headache(784.0)     chronic  . Irritable bowel   . Gastric ulcer     Past Surgical History  Procedure Laterality Date  . Hernia repair    . Nasal septum surgery    . Retinal detachment surgery      Family History  Problem Relation Age of Onset  . Hypertension Father   . Diabetes Father   . Hyperlipidemia Father   . Depression Maternal Grandfather     prostate  . Depression Paternal Grandfather     committed suicide  . Hypothyroidism Other   . Diabetes Mellitus II Brother   . Prostate cancer Father     Social History   Social History  . Marital Status: Married    Spouse Name: [name]  . Number of Children: 1  . Years of Education: N/A   Occupational History  . [employer]    Social History Main Topics  . Smoking status: Never Smoker   . Smokeless tobacco: Never Used  . Alcohol Use: Yes     Comment: occasional   . Drug Use: None  . Sexual Activity: Not Asked   Other Topics Concern  . None   Social History Narrative   Regular exercise - yes   1 daughter 66 years old     Current outpatient prescriptions:  .  NEEDLE,  DISP, 20 G (BD DISP NEEDLES) 20G X 1" MISC, 1 each by Does not apply route every 30 (thirty) days. To draw up med, Disp: 100 each, Rfl: 11 .  ondansetron (ZOFRAN ODT) 8 MG disintegrating tablet, Take 1 tablet (8 mg total) by mouth every 8 (eight) hours as needed for nausea or vomiting., Disp: 10 tablet, Rfl: 0 .  Syringe/Needle, Disp, (SYRINGE 3CC/22GX1-1/2") 22G X 1-1/2" 3 ML MISC, 1 each by Does not apply route every 30 (thirty) days. To administer med, Disp: 100 each, Rfl: 11 .  testosterone cypionate (DEPOTESTOTERONE CYPIONATE) 100 MG/ML injection, Inject 1.5 mL every 14 days.  For IM use only, Disp: 10 mL, Rfl: 5 .  timolol (TIMOPTIC) 0.25 % ophthalmic solution, Place 1 drop into the left eye daily.  , Disp: , Rfl:  .  travoprost, benzalkonium, (TRAVATAN) 0.004 % ophthalmic solution, Place 1 drop into both eyes at bedtime.  , Disp: , Rfl:  .  triamcinolone cream (KENALOG) 0.1 %, Apply 1 application topically 2 (two) times daily., Disp: 30 g, Rfl: 0  EXAM:  Filed Vitals:   06/23/15 1459  BP: 110/78  Pulse: 80  Temp: 98.2 F (36.8 C)    Body mass index is 25.32 kg/(m^2).  GENERAL: vitals reviewed and listed  above, alert, oriented, appears well hydrated and in no acute distress  HEENT: atraumatic, conjunttiva clear, no obvious abnormalities on inspection of external nose and ears  NECK: no obvious masses on inspection  LUNGS: clear to auscultation bilaterally, no wheezes, rales or rhonchi, good air movement  CV: HRRR, no peripheral edema  ABD: BS+, soft, NTTP, no rebound or guarding  MS: moves all extremities without noticeable abnormality  PSYCH: pleasant and cooperative, no obvious depression or anxiety  ASSESSMENT AND PLAN:  Discussed the following assessment and plan:  Gastroenteritis  -symptoms have improved significantly -he is nervous about eating given how sick he was -supportive measures and advised small meals and avoidance of dairy -Patient advised to return  or notify a doctor immediately if symptoms worsen or persist or new concerns arise.  Patient Instructions  Please use imodium (available over the counter if any further diarrhea  Can use probiotic (I would advise Align) daily for 3 weeks  Zantac (available over the counter) for 2 weeks if needed  Plenty of fluids with electrolytes (soup broth is good)   No dairy for 7 days  Follow up if symptoms worsen or persist        Daliana Leverett R.

## 2015-06-23 NOTE — Patient Instructions (Signed)
Please use imodium (available over the counter if any further diarrhea  Can use probiotic (I would advise Align) daily for 3 weeks  Zantac (available over the counter) for 2 weeks if needed  Plenty of fluids with electrolytes (soup broth is good)   No dairy for 7 days  Follow up if symptoms worsen or persist

## 2015-06-30 ENCOUNTER — Other Ambulatory Visit: Payer: BLUE CROSS/BLUE SHIELD

## 2015-07-06 ENCOUNTER — Encounter: Payer: BLUE CROSS/BLUE SHIELD | Admitting: Internal Medicine

## 2015-07-06 ENCOUNTER — Other Ambulatory Visit (INDEPENDENT_AMBULATORY_CARE_PROVIDER_SITE_OTHER): Payer: BLUE CROSS/BLUE SHIELD

## 2015-07-06 DIAGNOSIS — Z0000 Encounter for general adult medical examination without abnormal findings: Secondary | ICD-10-CM | POA: Diagnosis not present

## 2015-07-06 DIAGNOSIS — E291 Testicular hypofunction: Secondary | ICD-10-CM

## 2015-07-06 LAB — HEPATIC FUNCTION PANEL
ALBUMIN: 4.3 g/dL (ref 3.5–5.2)
ALT: 15 U/L (ref 0–53)
AST: 17 U/L (ref 0–37)
Alkaline Phosphatase: 64 U/L (ref 39–117)
BILIRUBIN TOTAL: 1.4 mg/dL — AB (ref 0.2–1.2)
Bilirubin, Direct: 0.2 mg/dL (ref 0.0–0.3)
Total Protein: 6.6 g/dL (ref 6.0–8.3)

## 2015-07-06 LAB — POC URINALSYSI DIPSTICK (AUTOMATED)
Bilirubin, UA: NEGATIVE
GLUCOSE UA: NEGATIVE
Ketones, UA: NEGATIVE
Leukocytes, UA: NEGATIVE
NITRITE UA: NEGATIVE
PH UA: 6.5
Protein, UA: NEGATIVE
RBC UA: NEGATIVE
SPEC GRAV UA: 1.025
UROBILINOGEN UA: 0.2

## 2015-07-06 LAB — CBC WITH DIFFERENTIAL/PLATELET
Basophils Absolute: 0.1 10*3/uL (ref 0.0–0.1)
Basophils Relative: 0.9 % (ref 0.0–3.0)
Eosinophils Absolute: 0.3 10*3/uL (ref 0.0–0.7)
Eosinophils Relative: 3.4 % (ref 0.0–5.0)
HCT: 44.5 % (ref 39.0–52.0)
Hemoglobin: 14.6 g/dL (ref 13.0–17.0)
LYMPHS ABS: 2.7 10*3/uL (ref 0.7–4.0)
Lymphocytes Relative: 35.3 % (ref 12.0–46.0)
MCHC: 32.8 g/dL (ref 30.0–36.0)
MCV: 90.6 fl (ref 78.0–100.0)
MONO ABS: 0.5 10*3/uL (ref 0.1–1.0)
Monocytes Relative: 6.3 % (ref 3.0–12.0)
NEUTROS ABS: 4.2 10*3/uL (ref 1.4–7.7)
NEUTROS PCT: 54.1 % (ref 43.0–77.0)
PLATELETS: 296 10*3/uL (ref 150.0–400.0)
RBC: 4.91 Mil/uL (ref 4.22–5.81)
RDW: 13.5 % (ref 11.5–15.5)
WBC: 7.7 10*3/uL (ref 4.0–10.5)

## 2015-07-06 LAB — BASIC METABOLIC PANEL
BUN: 20 mg/dL (ref 6–23)
CO2: 28 meq/L (ref 19–32)
Calcium: 9.3 mg/dL (ref 8.4–10.5)
Chloride: 106 mEq/L (ref 96–112)
Creatinine, Ser: 0.89 mg/dL (ref 0.40–1.50)
GFR: 98.1 mL/min (ref >60.00)
GLUCOSE: 95 mg/dL (ref 70–99)
POTASSIUM: 4.8 meq/L (ref 3.5–5.1)
SODIUM: 142 meq/L (ref 135–145)

## 2015-07-06 LAB — LIPID PANEL
CHOLESTEROL: 166 mg/dL (ref 0–200)
HDL: 42.7 mg/dL (ref >39.00)
LDL CALC: 112 mg/dL — AB (ref 0–99)
NonHDL: 123.2
Total CHOL/HDL Ratio: 4
Triglycerides: 58 mg/dL (ref 0.0–149.0)
VLDL: 11.6 mg/dL (ref 0.0–40.0)

## 2015-07-06 LAB — PSA: PSA: 1.14 ng/mL (ref 0.10–4.00)

## 2015-07-06 LAB — TSH: TSH: 1.18 u[IU]/mL (ref 0.35–4.50)

## 2015-07-07 LAB — TESTOSTERONE, FREE, TOTAL, SHBG
SEX HORMONE BINDING: 30 nmol/L (ref 10–50)
TESTOSTERONE-% FREE: 2.6 % (ref 1.6–2.9)
Testosterone, Free: 263.1 pg/mL — ABNORMAL HIGH (ref 47.0–244.0)
Testosterone: 1030 ng/dL — ABNORMAL HIGH (ref 250–827)

## 2015-07-08 ENCOUNTER — Other Ambulatory Visit: Payer: Self-pay | Admitting: Internal Medicine

## 2015-07-08 DIAGNOSIS — E291 Testicular hypofunction: Secondary | ICD-10-CM

## 2015-07-08 MED ORDER — TESTOSTERONE CYPIONATE 100 MG/ML IM SOLN: INTRAMUSCULAR | Status: DC

## 2015-07-10 ENCOUNTER — Encounter: Payer: BLUE CROSS/BLUE SHIELD | Admitting: Internal Medicine

## 2015-09-03 DIAGNOSIS — M542 Cervicalgia: Secondary | ICD-10-CM | POA: Diagnosis not present

## 2015-09-03 DIAGNOSIS — M9901 Segmental and somatic dysfunction of cervical region: Secondary | ICD-10-CM | POA: Diagnosis not present

## 2015-09-03 DIAGNOSIS — M545 Low back pain: Secondary | ICD-10-CM | POA: Diagnosis not present

## 2015-09-03 DIAGNOSIS — M546 Pain in thoracic spine: Secondary | ICD-10-CM | POA: Diagnosis not present

## 2015-09-04 DIAGNOSIS — M542 Cervicalgia: Secondary | ICD-10-CM | POA: Diagnosis not present

## 2015-09-04 DIAGNOSIS — M546 Pain in thoracic spine: Secondary | ICD-10-CM | POA: Diagnosis not present

## 2015-09-04 DIAGNOSIS — M545 Low back pain: Secondary | ICD-10-CM | POA: Diagnosis not present

## 2015-09-04 DIAGNOSIS — M9901 Segmental and somatic dysfunction of cervical region: Secondary | ICD-10-CM | POA: Diagnosis not present

## 2015-09-07 DIAGNOSIS — M546 Pain in thoracic spine: Secondary | ICD-10-CM | POA: Diagnosis not present

## 2015-09-07 DIAGNOSIS — M545 Low back pain: Secondary | ICD-10-CM | POA: Diagnosis not present

## 2015-09-07 DIAGNOSIS — M9901 Segmental and somatic dysfunction of cervical region: Secondary | ICD-10-CM | POA: Diagnosis not present

## 2015-09-07 DIAGNOSIS — M542 Cervicalgia: Secondary | ICD-10-CM | POA: Diagnosis not present

## 2015-09-22 ENCOUNTER — Encounter: Payer: Self-pay | Admitting: Adult Health

## 2015-09-22 ENCOUNTER — Ambulatory Visit (INDEPENDENT_AMBULATORY_CARE_PROVIDER_SITE_OTHER): Payer: BLUE CROSS/BLUE SHIELD | Admitting: Adult Health

## 2015-09-22 VITALS — BP 122/82 | Temp 98.2°F | Wt 204.2 lb

## 2015-09-22 DIAGNOSIS — R0789 Other chest pain: Secondary | ICD-10-CM

## 2015-09-22 MED ORDER — PANTOPRAZOLE SODIUM 40 MG PO TBEC: 40.0000 mg | DELAYED_RELEASE_TABLET | Freq: Every day | ORAL | Status: DC

## 2015-09-22 NOTE — Progress Notes (Signed)
Subjective:    Patient ID: Isaiah Cordova, male    DOB: 1971/03/26, 45 y.o.   MRN: [id number]  HPI  45 year old male who presents to the office today for chest pain. He reports that over the past few months he has had occasional chest pain and has been happening more frequently. He reports that the pain is " intense" and " feels like someone is stabbing me in the chest and then it feels like someone is sitting on me." The pain is not there when he takes Tums or Prilosec. He denies any radiates pain. Does have episodes of nausea with the pain.   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 He is working 7 days a week and is eating a lot of fast food.   He denies any history of cardiac issues.   He has had some long card rides recently. The longest car ride has been 8 hours.   Review of Systems  Constitutional: Negative.   Respiratory: Negative for cough, chest tightness, shortness of breath and wheezing.   Cardiovascular: Positive for chest pain. Negative for palpitations and leg swelling.  Gastrointestinal: Positive for nausea.  Neurological: Negative.   All other systems reviewed and are negative.  Past Medical History  Diagnosis Date  . Concussion   . Hemorrhoids, external   . Rectal bleeding     history of, negative colonoscopy 2005-2006  . Headache(784.0)     chronic  . Irritable bowel   . Gastric ulcer     Social History   Social History  . Marital Status: Married    Spouse Name: [name]  . Number of Children: 1  . Years of Education: N/A   Occupational History  . [employer]    Social History Main Topics  . Smoking status: Never Smoker   . Smokeless tobacco: Never Used  . Alcohol Use: Yes     Comment: occasional   . Drug Use: Not on file  . Sexual Activity: Not on file   Other Topics Concern  . Not on file   Social History Narrative   Regular exercise - yes   1 daughter 46 years old    Past  Surgical History  Procedure Laterality Date  . Hernia repair    . Nasal septum surgery    . Retinal detachment surgery      Family History  Problem Relation Age of Onset  . Hypertension Father   . Diabetes Father   . Hyperlipidemia Father   . Depression Maternal Grandfather     prostate  . Depression Paternal Grandfather     committed suicide  . Hypothyroidism Other   . Diabetes Mellitus II Brother   . Prostate cancer Father     Allergies  Allergen Reactions  . Lisdexamfetamine     REACTION: chest pains    Current Outpatient Prescriptions on File Prior to Visit  Medication Sig Dispense Refill  . B-D 3CC LUER-LOK SYR 22GX1-1/2 22G X 1-1/2" 3 ML MISC USE AS DIRECTED 100 each 0  . NEEDLE, DISP, 20 G (BD DISP NEEDLES) 20G X 1" MISC 1 each by Does not apply route every 30 (thirty) days. To draw up med 100 each 11  . testosterone cypionate (DEPOTESTOTERONE CYPIONATE) 100 MG/ML injection Inject 1.5 mL every 14 days.  For IM use only 10 mL 5  . timolol (TIMOPTIC) 0.25 % ophthalmic solution Place  1 drop into the left eye daily.      . travoprost, benzalkonium, (TRAVATAN) 0.004 % ophthalmic solution Place 1 drop into both eyes at bedtime.      . ondansetron (ZOFRAN ODT) 8 MG disintegrating tablet Take 1 tablet (8 mg total) by mouth every 8 (eight) hours as needed for nausea or vomiting. (Patient not taking: Reported on 09/22/2015) 10 tablet 0  . triamcinolone cream (KENALOG) 0.1 % Apply 1 application topically 2 (two) times daily. (Patient not taking: Reported on 09/22/2015) 30 g 0   No current facility-administered medications on file prior to visit.    BP 122/82 mmHg  Temp(Src) 98.2 F (36.8 C) (Oral)  Wt 204 lb 3.2 oz (92.625 kg)        Objective:   Physical Exam  Constitutional: He is oriented to person, place, and time. He appears well-developed and well-nourished. No distress.  Cardiovascular: Normal rate, regular rhythm, S1 normal, S2 normal, normal heart sounds and  intact distal pulses.  PMI is not displaced.  Exam reveals no gallop, no distant heart sounds and no friction rub.   No murmur heard. Pulmonary/Chest: Effort normal and breath sounds normal. No respiratory distress. He has no wheezes. He has no rales. He exhibits no tenderness.  Neurological: He is alert and oriented to person, place, and time.  Skin: Skin is warm, dry and intact. No rash noted. He is not diaphoretic. No erythema. No pallor.     Psychiatric: He has a normal mood and affect. His behavior is normal. Judgment and thought content normal.  Vitals reviewed.     Assessment & Plan:  1. Other chest pain - Likely GERD but need to rule out other causes such as Esophageal pain, PE, Pneumothroax, pericarditis. He is not septic looking, does not have pain or swelling in calfs, not tachycardic, and this is not sudden onset.  - EKG 12-Lead-= NSR, Rate 67 - pantoprazole (PROTONIX) 40 MG tablet; Take 1 tablet (40 mg total) by mouth daily.  Dispense: 30 tablet; Refill: 3 - CBC with Differential/Platelet - Basic metabolic panel - Magnesium - Follow up in two weeks - he needs to change his diet.    Dorothyann Peng, NP

## 2015-09-22 NOTE — Patient Instructions (Addendum)
It was great meeting you.   Your chest discomfort is likely from acid reflux or GERD. You need to make healthy choices with your meals on the road. Try and stay away from foods high in fat and high acidic foods.   Your EKG was normal   I have sent in a prescription for Protonix, take this daily.   I will follow up with you regarding your blood work.   Go to the [facility] office tomorrow for a chest x ray   Follow up with me in two weeks  Follow up with me for an Establish care visit.   Let me know if you need anything.   Gastroesophageal Reflux Disease, Adult Normally, food travels down the esophagus and stays in the stomach to be digested. However, when a person has gastroesophageal reflux disease (GERD), food and stomach acid move back up into the esophagus. When this happens, the esophagus becomes sore and inflamed. Over time, GERD can create small holes (ulcers) in the lining of the esophagus.  CAUSES This condition is caused by a problem with the muscle between the esophagus and the stomach (lower esophageal sphincter, or LES). Normally, the LES muscle closes after food passes through the esophagus to the stomach. When the LES is weakened or abnormal, it does not close properly, and that allows food and stomach acid to go back up into the esophagus. The LES can be weakened by certain dietary substances, medicines, and medical conditions, including:  Tobacco use.  Pregnancy.  Having a hiatal hernia.  Heavy alcohol use.  Certain foods and beverages, such as coffee, chocolate, onions, and peppermint. RISK FACTORS This condition is more likely to develop in:  People who have an increased body weight.  People who have connective tissue disorders.  People who use NSAID medicines. SYMPTOMS Symptoms of this condition include:  Heartburn.  Difficult or painful swallowing.  The feeling of having a lump in the throat.  Abitter taste in the mouth.  Bad breath.  Having a  large amount of saliva.  Having an upset or bloated stomach.  Belching.  Chest pain.  Shortness of breath or wheezing.  Ongoing (chronic) cough or a night-time cough.  Wearing away of tooth enamel.  Weight loss. Different conditions can cause chest pain. Make sure to see your health care provider if you experience chest pain. DIAGNOSIS Your health care provider will take a medical history and perform a physical exam. To determine if you have mild or severe GERD, your health care provider may also monitor how you respond to treatment. You may also have other tests, including:  An endoscopy toexamine your stomach and esophagus with a small camera.  A test thatmeasures the acidity level in your esophagus.  A test thatmeasures how much pressure is on your esophagus.  A barium swallow or modified barium swallow to show the shape, size, and functioning of your esophagus. TREATMENT The goal of treatment is to help relieve your symptoms and to prevent complications. Treatment for this condition may vary depending on how severe your symptoms are. Your health care provider may recommend:  Changes to your diet.  Medicine.  Surgery. HOME CARE INSTRUCTIONS Diet  Follow a diet as recommended by your health care provider. This may involve avoiding foods and drinks such as:  Coffee and tea (with or without caffeine).  Drinks that containalcohol.  Energy drinks and sports drinks.  Carbonated drinks or sodas.  Chocolate and cocoa.  Peppermint and mint flavorings.  Garlic and  onions.  Horseradish.  Spicy and acidic foods, including peppers, chili powder, curry powder, vinegar, hot sauces, and barbecue sauce.  Citrus fruit juices and citrus fruits, such as oranges, lemons, and limes.  Tomato-based foods, such as red sauce, chili, salsa, and pizza with red sauce.  Fried and fatty foods, such as donuts, french fries, potato chips, and high-fat dressings.  High-fat meats,  such as hot dogs and fatty cuts of red and white meats, such as rib eye steak, sausage, ham, and bacon.  High-fat dairy items, such as whole milk, butter, and cream cheese.  Eat small, frequent meals instead of large meals.  Avoid drinking large amounts of liquid with your meals.  Avoid eating meals during the 2-3 hours before bedtime.  Avoid lying down right after you eat.  Do not exercise right after you eat. General Instructions  Pay attention to any changes in your symptoms.  Take over-the-counter and prescription medicines only as told by your health care provider. Do not take aspirin, ibuprofen, or other NSAIDs unless your health care provider told you to do so.  Do not use any tobacco products, including cigarettes, chewing tobacco, and e-cigarettes. If you need help quitting, ask your health care provider.  Wear loose-fitting clothing. Do not wear anything tight around your waist that causes pressure on your abdomen.  Raise (elevate) the head of your bed 6 inches (15cm).  Try to reduce your stress, such as with yoga or meditation. If you need help reducing stress, ask your health care provider.  If you are overweight, reduce your weight to an amount that is healthy for you. Ask your health care provider for guidance about a safe weight loss goal.  Keep all follow-up visits as told by your health care provider. This is important. SEEK MEDICAL CARE IF:  You have new symptoms.  You have unexplained weight loss.  You have difficulty swallowing, or it hurts to swallow.  You have wheezing or a persistent cough.  Your symptoms do not improve with treatment.  You have a hoarse voice. SEEK IMMEDIATE MEDICAL CARE IF:  You have pain in your arms, neck, jaw, teeth, or back.  You feel sweaty, dizzy, or light-headed.  You have chest pain or shortness of breath.  You vomit and your vomit looks like blood or coffee grounds.  You faint.  Your stool is bloody or  black.  You cannot swallow, drink, or eat.   This information is not intended to replace advice given to you by your health care provider. Make sure you discuss any questions you have with your health care provider.   Document Released: 02/23/2005 Document Revised: 02/04/2015 Document Reviewed: 09/10/2014 Elsevier Interactive Patient Education [employer].

## 2015-09-23 ENCOUNTER — Ambulatory Visit (INDEPENDENT_AMBULATORY_CARE_PROVIDER_SITE_OTHER)
Admission: RE | Admit: 2015-09-23 | Discharge: 2015-09-23 | Disposition: A | Discharge status: Home / Self Care | Payer: BLUE CROSS/BLUE SHIELD | Source: Ambulatory Visit | Attending: Adult Health | Admitting: Adult Health

## 2015-09-23 ENCOUNTER — Telehealth: Payer: Self-pay | Admitting: Adult Health

## 2015-09-23 DIAGNOSIS — I517 Cardiomegaly: Secondary | ICD-10-CM

## 2015-09-23 DIAGNOSIS — R0789 Other chest pain: Secondary | ICD-10-CM | POA: Diagnosis not present

## 2015-09-23 DIAGNOSIS — R079 Chest pain, unspecified: Secondary | ICD-10-CM | POA: Diagnosis not present

## 2015-09-23 LAB — CBC WITH DIFFERENTIAL/PLATELET
BASOS PCT: 0.3 % (ref 0.0–3.0)
Basophils Absolute: 0 10*3/uL (ref 0.0–0.1)
EOS PCT: 5.4 % — AB (ref 0.0–5.0)
Eosinophils Absolute: 0.4 10*3/uL (ref 0.0–0.7)
HCT: 43.4 % (ref 39.0–52.0)
HEMOGLOBIN: 14.7 g/dL (ref 13.0–17.0)
Lymphocytes Relative: 43.8 % (ref 12.0–46.0)
Lymphs Abs: 3.4 10*3/uL (ref 0.7–4.0)
MCHC: 33.9 g/dL (ref 30.0–36.0)
MCV: 89.6 fl (ref 78.0–100.0)
MONO ABS: 0.6 10*3/uL (ref 0.1–1.0)
Monocytes Relative: 7.8 % (ref 3.0–12.0)
NEUTROS ABS: 3.3 10*3/uL (ref 1.4–7.7)
Neutrophils Relative %: 42.7 % — ABNORMAL LOW (ref 43.0–77.0)
PLATELETS: 266 10*3/uL (ref 150.0–400.0)
RBC: 4.84 Mil/uL (ref 4.22–5.81)
RDW: 13.1 % (ref 11.5–15.5)
WBC: 7.7 10*3/uL (ref 4.0–10.5)

## 2015-09-23 LAB — BASIC METABOLIC PANEL
BUN: 16 mg/dL (ref 6–23)
CALCIUM: 9.7 mg/dL (ref 8.4–10.5)
CO2: 32 mEq/L (ref 19–32)
Chloride: 105 mEq/L (ref 96–112)
Creatinine, Ser: 1.15 mg/dL (ref 0.40–1.50)
GFR: 72.91 mL/min (ref >60.00)
GLUCOSE: 96 mg/dL (ref 70–99)
Potassium: 4.6 mEq/L (ref 3.5–5.1)
Sodium: 143 mEq/L (ref 135–145)

## 2015-09-23 LAB — MAGNESIUM: Magnesium: 2.1 mg/dL (ref 1.5–2.5)

## 2015-09-23 NOTE — Telephone Encounter (Signed)
Spoke to Isaiah Cordova on the phone and informed Isaiah Cordova of his imaging and lab results. Chest x-ray showed borderline cardiomegaly, due to chest pain I will refer Isaiah Cordova to cardiology

## 2015-09-23 NOTE — Addendum Note (Signed)
Addended by: Apolinar Junes on: 09/23/2015 08:36 AM   Modules accepted: Orders

## 2015-10-06 ENCOUNTER — Ambulatory Visit (INDEPENDENT_AMBULATORY_CARE_PROVIDER_SITE_OTHER): Payer: BLUE CROSS/BLUE SHIELD | Admitting: Adult Health

## 2015-10-06 ENCOUNTER — Encounter: Payer: Self-pay | Admitting: Adult Health

## 2015-10-06 VITALS — BP 122/72 | Temp 98.4°F | Wt 203.0 lb

## 2015-10-06 DIAGNOSIS — K219 Gastro-esophageal reflux disease without esophagitis: Secondary | ICD-10-CM

## 2015-10-06 NOTE — Progress Notes (Signed)
Subjective:    Patient ID: Isaiah Cordova, male    DOB: 06/25/1970, 45 y.o.   MRN: [id number]  HPI  45 year old male who returns for 2 week follow up. I last saw him on 09/23/2015 for chest pain like symptoms. He was started on Protonix. Lab work was unremarkable. Chest x ray showed boarderline cardiomegaly. I had referred him to Cardiology and he has an appointment on 09/16/2015.   Today in the office he reports that he is no longer having chest pain. He feels like " I some times I have a flat feeling" but denies pain as he had had it in the past.   He has not changed his diet and continues to eat a lot of his meals as fast food.   Review of Systems  Constitutional: Negative.   Respiratory: Negative.   Cardiovascular: Negative.   Neurological: Negative.   Hematological: Negative.    Past Medical History  Diagnosis Date  . Concussion   . Hemorrhoids, external   . Rectal bleeding     history of, negative colonoscopy 2005-2006  . Headache(784.0)     chronic  . Irritable bowel   . Gastric ulcer     Social History   Social History  . Marital Status: Married    Spouse Name: [name]  . Number of Children: 1  . Years of Education: N/A   Occupational History  . [employer]    Social History Main Topics  . Smoking status: Never Smoker   . Smokeless tobacco: Never Used  . Alcohol Use: Yes     Comment: occasional   . Drug Use: Not on file  . Sexual Activity: Not on file   Other Topics Concern  . Not on file   Social History Narrative   Regular exercise - yes   1 daughter 30 years old    Past Surgical History  Procedure Laterality Date  . Hernia repair    . Nasal septum surgery    . Retinal detachment surgery      Family History  Problem Relation Age of Onset  . Hypertension Father   . Diabetes Father   . Hyperlipidemia Father   . Depression Maternal Grandfather     prostate  . Depression Paternal Grandfather     committed suicide  .  Hypothyroidism Other   . Diabetes Mellitus II Brother   . Prostate cancer Father     Allergies  Allergen Reactions  . Lisdexamfetamine     REACTION: chest pains    Current Outpatient Prescriptions on File Prior to Visit  Medication Sig Dispense Refill  . B-D 3CC LUER-LOK SYR 22GX1-1/2 22G X 1-1/2" 3 ML MISC USE AS DIRECTED 100 each 0  . NEEDLE, DISP, 20 G (BD DISP NEEDLES) 20G X 1" MISC 1 each by Does not apply route every 30 (thirty) days. To draw up med 100 each 11  . ondansetron (ZOFRAN ODT) 8 MG disintegrating tablet Take 1 tablet (8 mg total) by mouth every 8 (eight) hours as needed for nausea or vomiting. 10 tablet 0  . pantoprazole (PROTONIX) 40 MG tablet Take 1 tablet (40 mg total) by mouth daily. 30 tablet 3  . testosterone cypionate (DEPOTESTOTERONE CYPIONATE) 100 MG/ML injection Inject 1.5 mL every 14 days.  For IM use only 10 mL 5  . timolol (TIMOPTIC) 0.25 % ophthalmic solution Place 1 drop into the left eye daily.      . travoprost, benzalkonium, (TRAVATAN) 0.004 %  ophthalmic solution Place 1 drop into both eyes at bedtime.      . triamcinolone cream (KENALOG) 0.1 % Apply 1 application topically 2 (two) times daily. 30 g 0   No current facility-administered medications on file prior to visit.    BP 122/72 mmHg  Temp(Src) 98.4 F (36.9 C) (Oral)  Wt 203 lb (92.08 kg)        Objective:   Physical Exam  Constitutional: He is oriented to person, place, and time. He appears well-developed and well-nourished. No distress.  Cardiovascular: Normal rate, regular rhythm, normal heart sounds and intact distal pulses.  Exam reveals no gallop and no friction rub.   No murmur heard. Pulmonary/Chest: Effort normal and breath sounds normal. No respiratory distress. He has no wheezes. He has no rales. He exhibits no tenderness.  Neurological: He is alert and oriented to person, place, and time.  Skin: Skin is warm and dry. No rash noted. He is not diaphoretic. No erythema. No  pallor.  Psychiatric: He has a normal mood and affect. His behavior is normal. Judgment and thought content normal.  Nursing note and vitals reviewed.     Assessment & Plan:  1. Gastroesophageal reflux disease without esophagitis - Continue with Protonix - Needs to change diet - Keep follow up appointment with cardiology   Dorothyann Peng, NP

## 2015-10-16 ENCOUNTER — Ambulatory Visit (INDEPENDENT_AMBULATORY_CARE_PROVIDER_SITE_OTHER): Payer: BLUE CROSS/BLUE SHIELD | Admitting: Internal Medicine

## 2015-10-16 ENCOUNTER — Encounter: Payer: Self-pay | Admitting: Internal Medicine

## 2015-10-16 VITALS — BP 124/82 | HR 70 | Ht 74.0 in | Wt 200.2 lb

## 2015-10-16 DIAGNOSIS — K219 Gastro-esophageal reflux disease without esophagitis: Secondary | ICD-10-CM | POA: Insufficient documentation

## 2015-10-16 DIAGNOSIS — K259 Gastric ulcer, unspecified as acute or chronic, without hemorrhage or perforation: Secondary | ICD-10-CM | POA: Diagnosis not present

## 2015-10-16 DIAGNOSIS — I517 Cardiomegaly: Secondary | ICD-10-CM | POA: Diagnosis not present

## 2015-10-16 DIAGNOSIS — K30 Functional dyspepsia: Secondary | ICD-10-CM

## 2015-10-16 DIAGNOSIS — R0789 Other chest pain: Secondary | ICD-10-CM | POA: Diagnosis not present

## 2015-10-16 DIAGNOSIS — R109 Unspecified abdominal pain: Secondary | ICD-10-CM

## 2015-10-16 HISTORY — DX: Unspecified abdominal pain: R10.9

## 2015-10-16 HISTORY — DX: Gastro-esophageal reflux disease without esophagitis: K21.9

## 2015-10-16 NOTE — Patient Instructions (Signed)
Medication Instructions:  Continue current medications  Labwork: NONE  Testing/Procedures: Your physician has requested that you have an echocardiogram. Echocardiography is a painless test that uses sound waves to create images of your heart. It provides your doctor with information about the size and shape of your heart and how well your heart's chambers and valves are working. This procedure takes approximately one hour. There are no restrictions for this procedure.   Follow-Up: You have been referred to Dr Carol Ada.[facility] Your physician recommends that you schedule a follow-up appointment in: As Needed  Any Other Special Instructions Will Be Listed Below (If Applicable).   If you need a refill on your cardiac medications before your next appointment, please call your pharmacy.

## 2015-10-16 NOTE — Progress Notes (Signed)
OFFICE NOTE  Chief Complaint:  Atypical chest pain, borderline cardiomegaly  Primary Care Physician: Drema Pry, DO  HPI:  Isaiah Cordova is a 45 y.o. male he was seen by Adriana Mccallum, for chest pain. Mr. Drozdowski reports discomfort which is mostly in the mid epigastric area for which she is pointing to. He says that it was described as a sharp and gnawing type pain. When he had his primary care visit he was started on Protonix which she says has improved those symptoms but he still feels a heaviness or knot feeling in his stomach area. He denies any pain in his chest, specifically substernal or left anterior chest pain. He exercises regularly and does not have any chest pain with exertion or worsening shortness of breath with activity. He did have a chest x-ray which showed borderline cardiomegaly however is no history of hypertension, so this is unusual. In fact he has few if any cardiac risk factors and is young in age. His family history is not significant for coronary disease. 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  PMHx:  Past Medical History  Diagnosis Date  . Concussion   . Hemorrhoids, external   . Rectal bleeding     history of, negative colonoscopy 2005-2006  . Headache(784.0)     chronic  . Irritable bowel   . Gastric ulcer     Past Surgical History  Procedure Laterality Date  . Hernia repair    . Nasal septum surgery    . Retinal detachment surgery      FAMHx:  Family History  Problem Relation Age of Onset  . Hypertension Father   . Diabetes Father   . Hyperlipidemia Father   . Depression Maternal Grandfather     prostate  . Depression Paternal Grandfather     committed suicide  . Hypothyroidism Other   . Diabetes Mellitus II Brother   . Prostate cancer Father     SOCHx:   reports that he has never smoked. He has never used smokeless tobacco. He reports that he drinks alcohol. His drug history is not on file.  ALLERGIES:  Allergies    Allergen Reactions  . Lisdexamfetamine     REACTION: chest pains    ROS: Pertinent items noted in HPI and remainder of comprehensive ROS otherwise negative.  HOME MEDS: Current Outpatient Prescriptions  Medication Sig Dispense Refill  . B-D 3CC LUER-LOK SYR 22GX1-1/2 22G X 1-1/2" 3 ML MISC USE AS DIRECTED 100 each 0  . NEEDLE, DISP, 20 G (BD DISP NEEDLES) 20G X 1" MISC 1 each by Does not apply route every 30 (thirty) days. To draw up med 100 each 11  . pantoprazole (PROTONIX) 40 MG tablet Take 1 tablet (40 mg total) by mouth daily. 30 tablet 3  . testosterone cypionate (DEPOTESTOTERONE CYPIONATE) 100 MG/ML injection Inject 1.5 mL every 14 days.  For IM use only 10 mL 5  . timolol (TIMOPTIC) 0.25 % ophthalmic solution Place 1 drop into the left eye daily.      . travoprost, benzalkonium, (TRAVATAN) 0.004 % ophthalmic solution Place 1 drop into both eyes at bedtime.      . triamcinolone cream (KENALOG) 0.1 % Apply 1 application topically 2 (two) times daily. 30 g 0   No current facility-administered medications for this visit.    LABS/IMAGING: No results found for this or any previous visit (from the past 48 hour(s)). No results found.  WEIGHTS: Wt Readings from Last 3 Encounters:  10/16/15 200 lb 3.2 oz (90.81 kg)  10/06/15 203 lb (92.08 kg)  09/22/15 204 lb 3.2 oz (92.625 kg)    VITALS: BP 124/82 mmHg  Pulse 70  Ht 6\' 2"  (1.88 m)  Wt 200 lb 3.2 oz (90.81 kg)  BMI 25.69 kg/m2  EXAM: General appearance: alert and no distress Neck: no carotid bruit and no JVD Lungs: clear to auscultation bilaterally Heart: regular rate and rhythm, S1, S2 normal, no murmur, click, rub or gallop Abdomen: Mild mid epigastric tenderness to palpation, no rebound or guarding, no right upper quadrant pain or Murphy sign Extremities: extremities normal, atraumatic, no cyanosis or edema Pulses: 2+ and symmetric Skin: Skin color, texture, turgor normal. No rashes or lesions Neurologic: Alert and  oriented X 3, normal strength and tone. Normal symmetric reflexes. Normal coordination and gait Psych: Pleasant  EKG: I reviewed an office EKG from his PCP which showed normal sinus rhythm and no ischemic changes  ASSESSMENT: 1. Atypical "chest" pain, likely midepigastric pain which could be gastritis or gastric ulcer 2. GERD-improved on PPI 3. Borderline cardiomegaly  PLAN: 1.   Mr. Meller has atypical pain symptoms which are not necessarily worse with exertion or relieved by rest. Most of his symptoms seem midepigastric. He does have borderline card or megaly on his chest x-ray which is unusual given the fact he has no history of hypertension. This could be due to chamber enlargement or perhaps indicate undiagnosed structural heart disease. I like to get an echocardiogram to evaluate that and hopefully if it shows normal systolic and diastolic function would effectively rule out any significant cardiac etiology of his symptoms. I do feel that his symptoms are more likely associated with gastric ulcer or gastritis. He has improved some on a PPI but is not totally better. He may need an EGD evaluation and I'll refer him back to Dr. Oretha Caprice to see him in the past.  We will contact him with results of his echo and if normal follow-up as needed. Thanks again for the kind referral.  Pixie Casino, MD, [facility] Attending Cardiologist [place] 10/16/2015, 1:56 PM

## 2015-10-27 ENCOUNTER — Encounter: Payer: Self-pay | Admitting: Gastroenterology

## 2015-10-27 ENCOUNTER — Ambulatory Visit (INDEPENDENT_AMBULATORY_CARE_PROVIDER_SITE_OTHER): Payer: BLUE CROSS/BLUE SHIELD | Admitting: Gastroenterology

## 2015-10-27 VITALS — BP 132/68 | HR 70 | Ht 74.0 in | Wt 199.0 lb

## 2015-10-27 DIAGNOSIS — R1013 Epigastric pain: Secondary | ICD-10-CM | POA: Diagnosis not present

## 2015-10-27 HISTORY — DX: Epigastric pain: R10.13

## 2015-10-27 NOTE — Patient Instructions (Signed)

## 2015-10-27 NOTE — Progress Notes (Signed)
10/27/2015 Isaiah Cordova [id number] [birth year]   HISTORY OF PRESENT ILLNESS:  This is a 45 year old male who is previously known to Dr. Ardis Hughs for colonoscopies in the past. He was actually referred to [place] on this occasion by Dr. Debara Pickett for evaluation of epigastric abdominal pain. Initially this was thought to be chest pain, which is why he was seen by Dr. Debara Pickett. The pain actually seems to be mostly located directly over his xiphoid process, which is tender to palpation. He did undergo EKG and is scheduled for an echocardiogram next week, but this is not thought to be cardiac in origin. Sent here to rule out peptic ulcer disease and other GI related illness. The patient tells me that he has a history of ulcers when he was in college, but does not recollect on how he was diagnosed. He tells me that the pain he has been experiencing was very severe and had him doubled over. It has been present for the past 2 months. He was started on pantoprazole 40 mg daily about 2 weeks ago and tells that it has helped immensely.  He denies NSAID use. He denies any overt heartburn or reflux-type symptoms.   Past Medical History  Diagnosis Date  . Concussion   . Hemorrhoids, external   . Rectal bleeding     history of, negative colonoscopy 2005-2006  . Headache(784.0)     chronic  . Irritable bowel   . Gastric ulcer    Past Surgical History  Procedure Laterality Date  . Hernia repair    . Nasal septum surgery    . Retinal detachment surgery      reports that he has never smoked. He has never used smokeless tobacco. He reports that he drinks alcohol. His drug history is not on file. family history includes Depression in his maternal grandfather and paternal grandfather; Diabetes in his father; Diabetes Mellitus II in his brother; Hyperlipidemia in his father; Hypertension in his father; Hypothyroidism in his other; Prostate cancer in his father. Allergies  Allergen Reactions  . Lisdexamfetamine     REACTION: chest pains      Outpatient Encounter Prescriptions as of 10/27/2015  Medication Sig  . B-D 3CC LUER-LOK SYR 22GX1-1/2 22G X 1-1/2" 3 ML MISC USE AS DIRECTED  . NEEDLE, DISP, 20 G (BD DISP NEEDLES) 20G X 1" MISC 1 each by Does not apply route every 30 (thirty) days. To draw up med  . pantoprazole (PROTONIX) 40 MG tablet Take 1 tablet (40 mg total) by mouth daily.  [facility] testosterone cypionate (DEPOTESTOTERONE CYPIONATE) 100 MG/ML injection Inject 1.5 mL every 14 days.  For IM use only  . timolol (TIMOPTIC) 0.25 % ophthalmic solution Place 1 drop into the left eye daily.    . travoprost, benzalkonium, (TRAVATAN) 0.004 % ophthalmic solution Place 1 drop into both eyes at bedtime.    . triamcinolone cream (KENALOG) 0.1 % Apply 1 application topically 2 (two) times daily.   No facility-administered encounter medications on file as of 10/27/2015.     REVIEW OF SYSTEMS  : All other systems reviewed and negative except where noted in the History of Present Illness.   PHYSICAL EXAM: BP 132/68 mmHg  Pulse 70  Ht 6\' 2"  (1.88 m)  Wt 199 lb (90.266 kg)  BMI 25.54 kg/m2 General: Well developed white male in no acute distress Head: Normocephalic and atraumatic Eyes:  Sclerae anicteric, conjunctiva pink. Ears: Normal auditory acuity Lungs: Clear throughout to auscultation Heart: Regular rate  and rhythm Abdomen: Soft, non-distended.  Normal bowel sounds.  Mild epigastric TTP.  Tenderness over xiphoid process primarily. Musculoskeletal: Symmetrical with no gross deformities  Skin: No lesions on visible extremities Extremities: No edema  Neurological: Alert oriented x 4, grossly non-focal Psychological:  Alert and cooperative. Normal mood and affect  ASSESSMENT AND PLAN: -45 year old male with complaints of epigastric pain. Pain is actually mostly over his xiphoid process. He does a lot of heavy lifting/activity for his job, so I wonder if this could actually be costochondritis or  something of that sort. He has noticed improvement on daily PPI therapy, however.  Question ulcer disease versus reflux esophagitis, etc. He will continue the daily PPI. We will schedule him for EGD with Dr. Ardis Hughs. The risks, benefits, and alternatives to EGD were discussed with the patient and he consents to proceed.  CC:  Shawna Orleans, Doe-Hyun R, DO CC:  Dr. Debara Pickett

## 2015-10-28 NOTE — Progress Notes (Signed)
i agree with the above note, plan 

## 2015-11-02 ENCOUNTER — Other Ambulatory Visit: Payer: Self-pay

## 2015-11-02 ENCOUNTER — Ambulatory Visit (HOSPITAL_COMMUNITY): Payer: BLUE CROSS/BLUE SHIELD | Attending: Cardiology

## 2015-11-02 DIAGNOSIS — I517 Cardiomegaly: Secondary | ICD-10-CM

## 2015-11-02 DIAGNOSIS — R0789 Other chest pain: Secondary | ICD-10-CM

## 2015-11-02 DIAGNOSIS — I77810 Thoracic aortic ectasia: Secondary | ICD-10-CM | POA: Insufficient documentation

## 2015-11-02 LAB — ECHOCARDIOGRAM COMPLETE
AOASC: 38 cm
CHL CUP DOP CALC LVOT VTI: 24.8 cm
E decel time: 208 msec
EERAT: 6.88
FS: 45 % — AB (ref 28–44)
IVS/LV PW RATIO, ED: 1.21
LA diam end sys: 36 cm
LA vol index: 24 mL/m2
LA vol: 52 cm3
LADIAMINDEX: 1.66 cm/m2
LASIZE: 36 cm
LAVOLA4C: 45 mL
LV E/e' medial: 6.88
LV E/e'average: 6.88
LVELAT: 13.4 cm/s
LVOT SV: 94 cm3
LVOT area: 3.8 cm2
LVOT diameter: 22 mm
LVOT peak grad rest: 5 mmHg
LVOT peak vel: 113 cm/s
MV Dec: 208
MVPG: 3 mmHg
MVPKAVEL: 57.2 m/s
MVPKEVEL: 92.2 m/s
PW: 9.74 mm — AB (ref 0.6–1.1)
TDI e' lateral: 13.4
TDI e' medial: 13.8

## 2015-11-09 ENCOUNTER — Encounter: Payer: Self-pay | Admitting: Gastroenterology

## 2015-11-16 ENCOUNTER — Ambulatory Visit (AMBULATORY_SURGERY_CENTER): Payer: BLUE CROSS/BLUE SHIELD | Admitting: Gastroenterology

## 2015-11-16 ENCOUNTER — Encounter: Payer: Self-pay | Admitting: Gastroenterology

## 2015-11-16 VITALS — BP 119/73 | HR 60 | Temp 98.0°F | Resp 12 | Ht 74.0 in | Wt 199.0 lb

## 2015-11-16 DIAGNOSIS — R1013 Epigastric pain: Secondary | ICD-10-CM | POA: Diagnosis not present

## 2015-11-16 DIAGNOSIS — K259 Gastric ulcer, unspecified as acute or chronic, without hemorrhage or perforation: Secondary | ICD-10-CM | POA: Diagnosis not present

## 2015-11-16 MED ORDER — SODIUM CHLORIDE 0.9 % IV SOLN: 500.0000 mL | INTRAVENOUS | Status: DC

## 2015-11-16 NOTE — Patient Instructions (Signed)
Discharge instructions given. Normal exam. Resume previous medication. YOU HAD AN ENDOSCOPIC PROCEDURE TODAY AT [facility]:   Refer to the procedure report that was given to you for any specific questions about what was found during the examination.  If the procedure report does not answer your questions, please call your gastroenterologist to clarify.  If you requested that your care partner not be given the details of your procedure findings, then the procedure report has been included in a sealed envelope for you to review at your convenience later.  YOU SHOULD EXPECT: Some feelings of bloating in the abdomen. Passage of more gas than usual.  Walking can help get rid of the air that was put into your GI tract during the procedure and reduce the bloating. If you had a lower endoscopy (such as a colonoscopy or flexible sigmoidoscopy) you may notice spotting of blood in your stool or on the toilet paper. If you underwent a bowel prep for your procedure, you may not have a normal bowel movement for a few days.  Please Note:  You might notice some irritation and congestion in your nose or some drainage.  This is from the oxygen used during your procedure.  There is no need for concern and it should clear up in a day or so.  SYMPTOMS TO REPORT IMMEDIATELY:    Following upper endoscopy (EGD)  Vomiting of blood or coffee ground material  New chest pain or pain under the shoulder blades  Painful or persistently difficult swallowing  New shortness of breath  Fever of 100F or higher  Black, tarry-looking stools  For urgent or emergent issues, a gastroenterologist can be reached at any hour by calling [phone].   DIET: Your first meal following the procedure should be a small meal and then it is ok to progress to your normal diet. Heavy or fried foods are harder to digest and may make you feel nauseous or bloated.  Likewise, meals heavy in dairy and vegetables can increase  bloating.  Drink plenty of fluids but you should avoid alcoholic beverages for 24 hours.  ACTIVITY:  You should plan to take it easy for the rest of today and you should NOT DRIVE or use heavy machinery until tomorrow (because of the sedation medicines used during the test).    FOLLOW UP: Our staff will call the number listed on your records the next business day following your procedure to check on you and address any questions or concerns that you may have regarding the information given to you following your procedure. If we do not reach you, we will leave a message.  However, if you are feeling well and you are not experiencing any problems, there is no need to return our call.  We will assume that you have returned to your regular daily activities without incident.  If any biopsies were taken you will be contacted by phone or by letter within the next 1-3 weeks.  Please call us at [phone] if you have not heard about the biopsies in 3 weeks.    SIGNATURES/CONFIDENTIALITY: You and/or your care partner have signed paperwork which will be entered into your electronic medical record.  These signatures attest to the fact that that the information above on your After Visit Summary has been reviewed and is understood.  Full responsibility of the confidentiality of this discharge information lies with you and/or your care-partner.

## 2015-11-16 NOTE — Op Note (Addendum)
[place] Patient Name: Isaiah Cordova Procedure Date: 11/16/2015 9:14 AM MRN: [id number] Endoscopist: Milus Banister , MD Age: 45 Referring MD:  Date of Birth: 07-03-1970 Gender: Male Account #: [id number] Procedure:                Upper GI endoscopy Indications:              Unexplained chest pain Medicines:                Monitored Anesthesia Care Procedure:                Pre-Anesthesia Assessment:                           - Prior to the procedure, a History and Physical                            was performed, and patient medications and                            allergies were reviewed. The patient's tolerance of                            previous anesthesia was also reviewed. The risks                            and benefits of the procedure and the sedation                            options and risks were discussed with the patient.                            All questions were answered, and informed consent                            was obtained. Prior Anticoagulants: The patient has                            taken no previous anticoagulant or antiplatelet                            agents. ASA Grade Assessment: II - A patient with                            mild systemic disease. After reviewing the risks                            and benefits, the patient was deemed in                            satisfactory condition to undergo the procedure.                           After obtaining informed consent, the endoscope was  passed under direct vision. Throughout the                            procedure, the patient's blood pressure, pulse, and                            oxygen saturations were monitored continuously. The                            Model GIF-HQ190 [id number]) scope was introduced                            through the mouth, and advanced to the second part                            of duodenum. The upper GI  endoscopy was                            accomplished without difficulty. The patient                            tolerated the procedure well. Scope In: Scope Out: Findings:                 The esophagus was normal.                           The stomach was normal.                           The examined duodenum was normal. Complications:            No immediate complications. Estimated blood loss:                            None. Estimated Blood Loss:     Estimated blood loss: none. Impression:               - Normal esophagus.                           - Normal stomach.                           - Normal examined duodenum.                           - No specimens collected. Recommendation:           - Patient has a contact number available for                            emergencies. The signs and symptoms of potential                            delayed complications were discussed with the  patient. Return to normal activities tomorrow.                            Written discharge instructions were provided to the                            patient.                           - Resume previous diet.                           - Continue present medications. Stay on protonix                            (pantoprazole) 40mg  once daily for another 4 weeks.                            This best taken 20-30 minutes prior to breakfast                            meal. After 4 weeks, please switch to OTC strength                            (20mg ) once daily for another 4 weeks. After that,                            take the 20mg  pill every other day for 2 weeks then                            stop completely.                           - No repeat upper endoscopy. Milus Banister, MD 11/16/2015 9:42:07 AM This report has been signed electronically.

## 2015-11-16 NOTE — Progress Notes (Signed)
A/ox3, pleased with MAC, report to RN 

## 2015-11-17 ENCOUNTER — Telehealth: Payer: Self-pay

## 2015-11-17 NOTE — Telephone Encounter (Signed)
  Follow up Call-  Call back number 11/16/2015  Post procedure Call Back phone  # [phone]  Permission to leave phone message Yes    Patient was called for follow up after his procedure on 11/16/2015. No answer at the number that was given for follow up phone call. A message was left on the answering machine.

## 2015-12-02 DIAGNOSIS — H5213 Myopia, bilateral: Secondary | ICD-10-CM | POA: Diagnosis not present

## 2015-12-18 DIAGNOSIS — H40051 Ocular hypertension, right eye: Secondary | ICD-10-CM | POA: Diagnosis not present

## 2015-12-18 DIAGNOSIS — H31003 Unspecified chorioretinal scars, bilateral: Secondary | ICD-10-CM | POA: Diagnosis not present

## 2015-12-18 DIAGNOSIS — H40052 Ocular hypertension, left eye: Secondary | ICD-10-CM | POA: Diagnosis not present

## 2015-12-18 DIAGNOSIS — H524 Presbyopia: Secondary | ICD-10-CM | POA: Diagnosis not present

## 2016-01-25 ENCOUNTER — Other Ambulatory Visit: Payer: Self-pay | Admitting: Internal Medicine

## 2016-01-26 NOTE — Telephone Encounter (Signed)
Ok to refill 

## 2016-01-27 NOTE — Telephone Encounter (Signed)
Ok to refill. But he needs to have his testosterone checked between day 7-8 after testosterone injection

## 2016-02-02 ENCOUNTER — Telehealth: Payer: Self-pay

## 2016-02-02 NOTE — Telephone Encounter (Signed)
Received fax from [facility] for PA on Testosterone Cyp injection. PA submitted & is pending. Key: [id number]

## 2016-02-05 NOTE — Telephone Encounter (Signed)
PA denied, no reason given as of 9.8.17.

## 2016-02-05 NOTE — Telephone Encounter (Signed)
Forms faxed

## 2016-02-05 NOTE — Telephone Encounter (Signed)
Forms received from insurance company. Forms filled out and waiting for prescriber's signature.

## 2016-02-09 ENCOUNTER — Telehealth: Payer: Self-pay | Admitting: Adult Health

## 2016-02-09 NOTE — Telephone Encounter (Signed)
° °  Pt call to say that they no longer make   testosterone cypionate (DEPOTESTOTERONE CYPIONATE) 100 MG/ML injection   Pt said his insurance company will approve the [place].

## 2016-02-10 NOTE — Telephone Encounter (Signed)
See below

## 2016-02-10 NOTE — Telephone Encounter (Signed)
He needs to come in and have his testosterone checked first. The order has been placed

## 2016-02-11 NOTE — Telephone Encounter (Signed)
Patient needs to have Testosterone checked before refilling medication. Please schedule lab visit.

## 2016-02-11 NOTE — Telephone Encounter (Signed)
Pt has been scheduled.  °

## 2016-02-18 ENCOUNTER — Other Ambulatory Visit: Payer: BLUE CROSS/BLUE SHIELD

## 2016-02-18 DIAGNOSIS — E291 Testicular hypofunction: Secondary | ICD-10-CM | POA: Diagnosis not present

## 2016-02-19 LAB — TESTOSTERONE TOTAL,FREE,BIO, MALES
ALBUMIN: 4.5 g/dL (ref 3.6–5.1)
SEX HORMONE BINDING: 40 nmol/L (ref 10–50)
TESTOSTERONE: 247 ng/dL — AB (ref 250–827)

## 2016-02-24 ENCOUNTER — Telehealth: Payer: Self-pay | Admitting: Adult Health

## 2016-02-24 NOTE — Telephone Encounter (Signed)
Pt following up on lab results from 9/21. Pt waiting to get testosterone filled. Please advise, thanks!

## 2016-02-24 NOTE — Telephone Encounter (Signed)
Please advise 

## 2016-02-25 ENCOUNTER — Other Ambulatory Visit: Payer: Self-pay

## 2016-02-25 MED ORDER — TESTOSTERONE CYPIONATE 100 MG/ML IM SOLN: INTRAMUSCULAR | 0 refills | Status: DC

## 2016-02-25 NOTE — Telephone Encounter (Signed)
Testosterone is low. It is ok for him to get testosterone shot

## 2016-02-26 NOTE — Telephone Encounter (Signed)
Rx has been faxed.

## 2016-05-10 DIAGNOSIS — M545 Low back pain: Secondary | ICD-10-CM | POA: Diagnosis not present

## 2016-05-10 DIAGNOSIS — M9901 Segmental and somatic dysfunction of cervical region: Secondary | ICD-10-CM | POA: Diagnosis not present

## 2016-05-10 DIAGNOSIS — M542 Cervicalgia: Secondary | ICD-10-CM | POA: Diagnosis not present

## 2016-05-10 DIAGNOSIS — M546 Pain in thoracic spine: Secondary | ICD-10-CM | POA: Diagnosis not present

## 2016-05-11 DIAGNOSIS — M9901 Segmental and somatic dysfunction of cervical region: Secondary | ICD-10-CM | POA: Diagnosis not present

## 2016-05-11 DIAGNOSIS — M546 Pain in thoracic spine: Secondary | ICD-10-CM | POA: Diagnosis not present

## 2016-05-11 DIAGNOSIS — M542 Cervicalgia: Secondary | ICD-10-CM | POA: Diagnosis not present

## 2016-05-11 DIAGNOSIS — M545 Low back pain: Secondary | ICD-10-CM | POA: Diagnosis not present

## 2016-05-17 DIAGNOSIS — H40051 Ocular hypertension, right eye: Secondary | ICD-10-CM | POA: Diagnosis not present

## 2016-05-17 DIAGNOSIS — H40052 Ocular hypertension, left eye: Secondary | ICD-10-CM | POA: Diagnosis not present

## 2016-05-26 DIAGNOSIS — M7542 Impingement syndrome of left shoulder: Secondary | ICD-10-CM | POA: Diagnosis not present

## 2016-06-08 DIAGNOSIS — M7542 Impingement syndrome of left shoulder: Secondary | ICD-10-CM | POA: Diagnosis not present

## 2016-07-14 DIAGNOSIS — M7542 Impingement syndrome of left shoulder: Secondary | ICD-10-CM | POA: Diagnosis not present

## 2016-07-25 DIAGNOSIS — H5213 Myopia, bilateral: Secondary | ICD-10-CM | POA: Diagnosis not present

## 2016-07-26 DIAGNOSIS — M542 Cervicalgia: Secondary | ICD-10-CM | POA: Diagnosis not present

## 2016-07-26 DIAGNOSIS — M9903 Segmental and somatic dysfunction of lumbar region: Secondary | ICD-10-CM | POA: Diagnosis not present

## 2016-07-26 DIAGNOSIS — M546 Pain in thoracic spine: Secondary | ICD-10-CM | POA: Diagnosis not present

## 2016-07-26 DIAGNOSIS — M545 Low back pain: Secondary | ICD-10-CM | POA: Diagnosis not present

## 2016-07-27 DIAGNOSIS — M9903 Segmental and somatic dysfunction of lumbar region: Secondary | ICD-10-CM | POA: Diagnosis not present

## 2016-07-27 DIAGNOSIS — M545 Low back pain: Secondary | ICD-10-CM | POA: Diagnosis not present

## 2016-07-27 DIAGNOSIS — M542 Cervicalgia: Secondary | ICD-10-CM | POA: Diagnosis not present

## 2016-07-27 DIAGNOSIS — M546 Pain in thoracic spine: Secondary | ICD-10-CM | POA: Diagnosis not present

## 2016-08-17 DIAGNOSIS — G5761 Lesion of plantar nerve, right lower limb: Secondary | ICD-10-CM | POA: Diagnosis not present

## 2016-10-03 ENCOUNTER — Other Ambulatory Visit: Payer: Self-pay | Admitting: Adult Health

## 2016-10-04 ENCOUNTER — Other Ambulatory Visit: Payer: Self-pay | Admitting: Adult Health

## 2016-10-04 DIAGNOSIS — R7989 Other specified abnormal findings of blood chemistry: Secondary | ICD-10-CM

## 2016-10-04 NOTE — Telephone Encounter (Signed)
Isaiah Cordova, would you mind scheduling this patient? He needs an office visit before this medication can be refilled.

## 2016-10-04 NOTE — Telephone Encounter (Signed)
Order for testosterone labs placed

## 2016-10-04 NOTE — Telephone Encounter (Signed)
He needs to have an office visit and have his Testosterone checked prior to refill

## 2016-10-04 NOTE — Telephone Encounter (Signed)
Noted  

## 2016-10-04 NOTE — Telephone Encounter (Signed)
Pt unable to come in this week  (out of town) and Tommi Rumps out of town next week. Is it ok to get an order for pt's testosterone lab, since this lab needs to be done in the am hours? Then pt has appointment to follow up with [facility] on 5/29 at 1 pm.

## 2016-10-04 NOTE — Telephone Encounter (Signed)
Ok to refill 

## 2016-10-04 NOTE — Telephone Encounter (Signed)
Is this ok?

## 2016-10-25 ENCOUNTER — Ambulatory Visit: Payer: BLUE CROSS/BLUE SHIELD | Admitting: Adult Health

## 2016-10-25 ENCOUNTER — Other Ambulatory Visit (INDEPENDENT_AMBULATORY_CARE_PROVIDER_SITE_OTHER): Payer: BLUE CROSS/BLUE SHIELD

## 2016-10-25 DIAGNOSIS — E291 Testicular hypofunction: Secondary | ICD-10-CM

## 2016-10-26 LAB — CP2130 TESTOSTERONE, TOTAL AND FREE
SEX HORMONE BINDING: 37 nmol/L (ref 10–50)
Testosterone, Free: 63.6 pg/mL (ref 47.0–244.0)
Testosterone-% Free: 1.9 % (ref 1.6–2.9)
Testosterone: 342 ng/dL (ref 300–890)

## 2016-10-27 ENCOUNTER — Other Ambulatory Visit: Payer: Self-pay

## 2016-10-27 MED ORDER — TESTOSTERONE CYPIONATE 100 MG/ML IM SOLN: INTRAMUSCULAR | 0 refills | Status: DC

## 2016-10-28 ENCOUNTER — Encounter: Payer: Self-pay | Admitting: Adult Health

## 2016-10-28 ENCOUNTER — Ambulatory Visit (INDEPENDENT_AMBULATORY_CARE_PROVIDER_SITE_OTHER): Payer: BLUE CROSS/BLUE SHIELD | Admitting: Adult Health

## 2016-10-28 VITALS — BP 122/70 | Temp 98.6°F | Ht 74.0 in | Wt 199.3 lb

## 2016-10-28 DIAGNOSIS — R42 Dizziness and giddiness: Secondary | ICD-10-CM

## 2016-10-28 DIAGNOSIS — R7989 Other specified abnormal findings of blood chemistry: Secondary | ICD-10-CM

## 2016-10-28 LAB — CBC WITH DIFFERENTIAL/PLATELET
BASOS ABS: 0.1 10*3/uL (ref 0.0–0.1)
Basophils Relative: 0.9 % (ref 0.0–3.0)
EOS ABS: 0.3 10*3/uL (ref 0.0–0.7)
Eosinophils Relative: 5.5 % — ABNORMAL HIGH (ref 0.0–5.0)
HCT: 44.5 % (ref 39.0–52.0)
Hemoglobin: 14.8 g/dL (ref 13.0–17.0)
LYMPHS ABS: 2.7 10*3/uL (ref 0.7–4.0)
Lymphocytes Relative: 46 % (ref 12.0–46.0)
MCHC: 33.2 g/dL (ref 30.0–36.0)
MCV: 91.1 fl (ref 78.0–100.0)
MONO ABS: 0.4 10*3/uL (ref 0.1–1.0)
MONOS PCT: 7 % (ref 3.0–12.0)
NEUTROS ABS: 2.4 10*3/uL (ref 1.4–7.7)
NEUTROS PCT: 40.6 % — AB (ref 43.0–77.0)
PLATELETS: 268 10*3/uL (ref 150.0–400.0)
RBC: 4.89 Mil/uL (ref 4.22–5.81)
RDW: 13 % (ref 11.5–15.5)
WBC: 5.8 10*3/uL (ref 4.0–10.5)

## 2016-10-28 LAB — BASIC METABOLIC PANEL
BUN: 16 mg/dL (ref 6–23)
CALCIUM: 9.7 mg/dL (ref 8.4–10.5)
CO2: 30 mEq/L (ref 19–32)
Chloride: 104 mEq/L (ref 96–112)
Creatinine, Ser: 0.84 mg/dL (ref 0.40–1.50)
GFR: 104.26 mL/min (ref >60.00)
GLUCOSE: 89 mg/dL (ref 70–99)
POTASSIUM: 4.7 meq/L (ref 3.5–5.1)
Sodium: 140 mEq/L (ref 135–145)

## 2016-10-28 LAB — TSH: TSH: 1.41 u[IU]/mL (ref 0.35–4.50)

## 2016-10-28 NOTE — Progress Notes (Signed)
Subjective:    Patient ID: Isaiah Cordova, male    DOB: July 27, 1970, 46 y.o.   MRN: [id number]  HPI  46 year old male who  has a past medical history of Anxiety; Concussion; Depression; Gastric ulcer; GERD (gastroesophageal reflux disease); Headache(784.0); Hemorrhoids, external; Irritable bowel; Neuromuscular disorder ([place]); and Rectal bleeding. He presents to the office today with vague complaints of fatigue, dizziness, and daily headaches. He has been out of his testosterone replacement and is upset that it always takes this office so long to get it filled   He saw his eye doctor about 6 months ago and his prescription changed but he has not picked up his new contacts or glasses yet.  He also reports that he is working 6-7 days a week.   He feels as though many of his symptoms are due to low T.    Review of Systems  Constitutional: Positive for fatigue.  Respiratory: Negative.   Cardiovascular: Negative.   Neurological: Positive for dizziness and headaches.  All other systems reviewed and are negative.  Past Medical History:  Diagnosis Date  . Anxiety   . Concussion   . Depression   . Gastric ulcer   . GERD (gastroesophageal reflux disease)   . Headache(784.0)    chronic  . Hemorrhoids, external   . Irritable bowel   . Neuromuscular disorder ([facility])    RLS  . Rectal bleeding    history of, negative colonoscopy 2005-2006    Social History   Social History  . Marital status: Married    Spouse name: [name]  . Number of children: 1  . Years of education: N/A   Occupational History  . [employer]   Social History Main Topics  . Smoking status: Never Smoker  . Smokeless tobacco: Never Used  . Alcohol use 0.0 oz/week     Comment: occasional   . Drug use: Unknown  . Sexual activity: Not on file   Other Topics Concern  . Not on file   Social History Narrative   Regular exercise - yes   1 daughter 74 years old    Past Surgical  History:  Procedure Laterality Date  . COLONOSCOPY    . HERNIA REPAIR    . NASAL SEPTUM SURGERY    . RETINAL DETACHMENT SURGERY      Family History  Problem Relation Age of Onset  . Hypertension Father   . Diabetes Father   . Hyperlipidemia Father   . Prostate cancer Father   . Depression Maternal Grandfather        prostate  . Depression Paternal Grandfather        committed suicide  . Hypothyroidism Other   . Diabetes Mellitus II Brother     Allergies  Allergen Reactions  . Dust Mite Extract   . Lisdexamfetamine     REACTION: chest pains  . Other     GRASS  . Pollen Extract     Current Outpatient Prescriptions on File Prior to Visit  Medication Sig Dispense Refill  . B-D 3CC LUER-LOK SYR 22GX1-1/2 22G X 1-1/2" 3 ML MISC USE AS DIRECTED 100 each 0  . NEEDLE, DISP, 20 G (BD DISP NEEDLES) 20G X 1" MISC 1 each by Does not apply route every 30 (thirty) days. To draw up med 100 each 11  . pantoprazole (PROTONIX) 40 MG tablet Take 1 tablet (40 mg total) by mouth daily. 30 tablet 3  . testosterone cypionate (  DEPOTESTOTERONE CYPIONATE) 100 MG/ML injection INJECT 1.5 ML INTRAMUSCULARLY EVERY 14 DAYS 10 mL 0  . timolol (TIMOPTIC) 0.25 % ophthalmic solution Place 1 drop into the left eye daily.      . travoprost, benzalkonium, (TRAVATAN) 0.004 % ophthalmic solution Place 1 drop into both eyes at bedtime.       No current facility-administered medications on file prior to visit.     BP 122/70 (BP Location: Left Arm, Patient Position: Sitting, Cuff Size: Normal)   Temp 98.6 F (37 C) (Oral)   Ht 6\' 2"  (1.88 m)   Wt 199 lb 4.8 oz (90.4 kg)   BMI 25.59 kg/m        Objective:   Physical Exam  Constitutional: He is oriented to person, place, and time. He appears well-developed and well-nourished. No distress.  Cardiovascular: Normal rate, regular rhythm, normal heart sounds and intact distal pulses.  Exam reveals no gallop and no friction rub.   No murmur  heard. Pulmonary/Chest: Effort normal and breath sounds normal. No respiratory distress. He has no wheezes. He has no rales. He exhibits no tenderness.  Abdominal: Soft. Bowel sounds are normal. He exhibits no distension and no mass. There is no tenderness. There is no rebound and no guarding.  Neurological: He is alert and oriented to person, place, and time.  Skin: Skin is warm and dry. No rash noted. He is not diaphoretic. No erythema. No pallor.  Psychiatric: He has a normal mood and affect. His behavior is normal. Judgment and thought content normal.  Nursing note and vitals reviewed.     Assessment & Plan:  1. Low testosterone - His prescription for testosterone was sent in this morning. Explained to the patient that Testosterone is a controlled substance and I can only write for a month at a time. He may be better suited with urology to manage this. He is ok with seeing them - Ambulatory referral to Urology  2. Dizziness - Likely due to low T. Will get basic labs. Advised to stay hydrated. If his symptoms do not resolve with testosterone replacement then will get MRI of head. Will follow up with at [facility]  - Basic Metabolic Panel - CBC with Differential/Platelet; Future - TSH  Dorothyann Peng, NP

## 2016-11-03 ENCOUNTER — Ambulatory Visit (INDEPENDENT_AMBULATORY_CARE_PROVIDER_SITE_OTHER): Payer: BLUE CROSS/BLUE SHIELD | Admitting: Adult Health

## 2016-11-03 ENCOUNTER — Encounter: Payer: Self-pay | Admitting: Adult Health

## 2016-11-03 VITALS — BP 110/72 | Temp 98.1°F | Ht 73.25 in | Wt 198.0 lb

## 2016-11-03 DIAGNOSIS — K219 Gastro-esophageal reflux disease without esophagitis: Secondary | ICD-10-CM

## 2016-11-03 DIAGNOSIS — Z0001 Encounter for general adult medical examination with abnormal findings: Secondary | ICD-10-CM

## 2016-11-03 DIAGNOSIS — R42 Dizziness and giddiness: Secondary | ICD-10-CM

## 2016-11-03 DIAGNOSIS — Z0000 Encounter for general adult medical examination without abnormal findings: Secondary | ICD-10-CM | POA: Diagnosis not present

## 2016-11-03 LAB — HEPATIC FUNCTION PANEL
ALT: 17 U/L (ref 0–53)
AST: 20 U/L (ref 0–37)
Albumin: 4.8 g/dL (ref 3.5–5.2)
Alkaline Phosphatase: 60 U/L (ref 39–117)
BILIRUBIN DIRECT: 0.2 mg/dL (ref 0.0–0.3)
BILIRUBIN TOTAL: 1.7 mg/dL — AB (ref 0.2–1.2)
Total Protein: 7.1 g/dL (ref 6.0–8.3)

## 2016-11-03 LAB — LIPID PANEL
CHOL/HDL RATIO: 4
Cholesterol: 224 mg/dL — ABNORMAL HIGH (ref 0–200)
HDL: 55.2 mg/dL (ref >39.00)
LDL CALC: 139 mg/dL — AB (ref 0–99)
NonHDL: 168.84
TRIGLYCERIDES: 150 mg/dL — AB (ref 0.0–149.0)
VLDL: 30 mg/dL (ref 0.0–40.0)

## 2016-11-03 LAB — PSA: PSA: 0.96 ng/mL (ref 0.10–4.00)

## 2016-11-03 LAB — BASIC METABOLIC PANEL
BUN: 19 mg/dL (ref 6–23)
CO2: 31 mEq/L (ref 19–32)
CREATININE: 0.95 mg/dL (ref 0.40–1.50)
Calcium: 10 mg/dL (ref 8.4–10.5)
Chloride: 101 mEq/L (ref 96–112)
GFR: 90.45 mL/min (ref >60.00)
Glucose, Bld: 92 mg/dL (ref 70–99)
POTASSIUM: 4.7 meq/L (ref 3.5–5.1)
Sodium: 140 mEq/L (ref 135–145)

## 2016-11-03 NOTE — Patient Instructions (Signed)
It was great seeing you today   I will follow up with you regarding your labs   Someone will call yo to schedule your MRI

## 2016-11-03 NOTE — Progress Notes (Signed)
Subjective:    Patient ID: Isaiah Cordova, male    DOB: [birth year], 46 y.o.   MRN: [id number]  HPI  Patient presents for yearly preventative medicine examination. He is a pleasant 46 year old male who  has a past medical history of Anxiety; Concussion; Depression; Gastric ulcer; GERD (gastroesophageal reflux disease); Headache(784.0); Hemorrhoids, external; Irritable bowel; Neuromuscular disorder ([place]); and Rectal bleeding.  All immunizations and health maintenance protocols were reviewed with the patient and needed orders were placed.  Appropriate screening laboratory values were ordered for the patient including screening of hyperlipidemia, renal function and hepatic function. If indicated by BPH, a PSA was ordered.  Medication reconciliation,  past medical history, social history, problem list and allergies were reviewed in detail with the patient  Goals were established with regard to weight loss, exercise, and  diet in compliance with medications. He does not do routine exercise and he eats a lot of fast food since he travels a lot.   He continues to have dizziness and headaches daily. He has had multiple concussions throughout his life. His last MRI was in 2013, which was negative. He would like to have another MRI. He has not followed up with his eye doctor but has an appointment next week.   His GERD is controlled with Protonix... When he takes it.   Review of Systems  Constitutional: Negative.   HENT: Negative.   Eyes: Negative.   Respiratory: Negative.   Cardiovascular: Negative.   Gastrointestinal: Negative.   Endocrine: Negative.   Genitourinary: Negative.   Musculoskeletal: Negative.   Skin: Negative.   Allergic/Immunologic: Negative.   Neurological: Positive for dizziness and headaches.  Hematological: Negative.   Psychiatric/Behavioral: Negative.   All other systems reviewed and are negative.  Past Medical History:  Diagnosis Date  . Anxiety   . Concussion    . Depression   . Gastric ulcer   . GERD (gastroesophageal reflux disease)   . Headache(784.0)    chronic  . Hemorrhoids, external   . Irritable bowel   . Neuromuscular disorder ([facility])    RLS  . Rectal bleeding    history of, negative colonoscopy 2005-2006    Social History   Social History  . Marital status: Married    Spouse name: [name]  . Number of children: 1  . Years of education: N/A   Occupational History  . [employer]   Social History Main Topics  . Smoking status: Never Smoker  . Smokeless tobacco: Never Used  . Alcohol use 1.2 oz/week    2 Cans of beer per week  . Drug use: Unknown  . Sexual activity: Not on file   Other Topics Concern  . Not on file   Social History Narrative   Regular exercise - yes   1 daughter 31 years old    Past Surgical History:  Procedure Laterality Date  . COLONOSCOPY    . HERNIA REPAIR    . NASAL SEPTUM SURGERY    . RETINAL DETACHMENT SURGERY      Family History  Problem Relation Age of Onset  . Hypertension Father   . Diabetes Father   . Hyperlipidemia Father   . Prostate cancer Father   . Depression Maternal Grandfather        prostate  . Depression Paternal Grandfather        committed suicide  . Hypothyroidism Other   . Diabetes Mellitus II Brother     Allergies  Allergen Reactions  . Dust Mite Extract   . Lisdexamfetamine     REACTION: chest pains  . Other     GRASS  . Pollen Extract     Current Outpatient Prescriptions on File Prior to Visit  Medication Sig Dispense Refill  . B-D 3CC LUER-LOK SYR 22GX1-1/2 22G X 1-1/2" 3 ML MISC USE AS DIRECTED 100 each 0  . NEEDLE, DISP, 20 G (BD DISP NEEDLES) 20G X 1" MISC 1 each by Does not apply route every 30 (thirty) days. To draw up med 100 each 11  . timolol (TIMOPTIC) 0.25 % ophthalmic solution Place 1 drop into the left eye daily.      . travoprost, benzalkonium, (TRAVATAN) 0.004 % ophthalmic solution Place 1 drop into both  eyes at bedtime.      Marland Kitchen testosterone cypionate (DEPOTESTOTERONE CYPIONATE) 100 MG/ML injection INJECT 1.5 ML INTRAMUSCULARLY EVERY 14 DAYS (Patient not taking: Reported on 11/03/2016) 10 mL 0   No current facility-administered medications on file prior to visit.     BP 110/72 (BP Location: Left Arm)   Temp 98.1 F (36.7 C) (Oral)   Ht 6' 1.25" (1.861 m)   Wt 198 lb (89.8 kg)   BMI 25.94 kg/m       Objective:   Physical Exam  Constitutional: He is oriented to person, place, and time. He appears well-developed and well-nourished. No distress.  HENT:  Head: Normocephalic and atraumatic.  Right Ear: External ear normal.  Left Ear: External ear normal.  Nose: Nose normal.  Mouth/Throat: Oropharynx is clear and moist. No oropharyngeal exudate.  Eyes: Conjunctivae and EOM are normal. Pupils are equal, round, and reactive to light. Right eye exhibits no discharge. Left eye exhibits no discharge. No scleral icterus.  Neck: Normal range of motion. Neck supple. No JVD present. Carotid bruit is not present. No tracheal deviation present. No thyromegaly present.  Cardiovascular: Normal rate, regular rhythm, normal heart sounds and intact distal pulses.  Exam reveals no gallop and no friction rub.   No murmur heard. Pulmonary/Chest: Effort normal and breath sounds normal. No stridor. No respiratory distress. He has no wheezes. He has no rales. He exhibits no tenderness.  Abdominal: Soft. Normal appearance and bowel sounds are normal. He exhibits no distension and no mass. There is no tenderness. There is no rebound and no guarding.  Genitourinary: Rectum normal and prostate normal.  Musculoskeletal: Normal range of motion. He exhibits no edema or deformity.  Lymphadenopathy:    He has no cervical adenopathy.  Neurological: He is alert and oriented to person, place, and time. He has normal reflexes. He displays normal reflexes. No cranial nerve deficit. He exhibits normal muscle tone. Coordination  normal.  Skin: Skin is warm and dry. No rash noted. He is not diaphoretic. No erythema. No pallor.  Psychiatric: He has a normal mood and affect. His behavior is normal. Judgment and thought content normal.  Nursing note and vitals reviewed.     Assessment & Plan:  1. Routine general medical examination at a health care facility - Educated on the importance of diet and exercise. He needs to eat healthier. Spoke about packing his own lunches when he travels  - Basic metabolic panel - Hepatic function panel - Lipid panel - PSA - MR MRA HEAD WO CONTRAST; Future - HIV antibody  2. Dizziness - MR MRA HEAD WO CONTRAST; Future - Consider referral to Neurology  - Follow up with eye doctor  3. Gastroesophageal reflux disease, esophagitis presence  not specified - Take protonix every day  - Needs to work on diet and exercise  Dorothyann Peng, NP

## 2016-11-04 LAB — HIV ANTIBODY (ROUTINE TESTING W REFLEX): HIV 1&2 Ab, 4th Generation: NONREACTIVE

## 2016-11-07 ENCOUNTER — Telehealth: Payer: Self-pay | Admitting: *Deleted

## 2016-11-07 NOTE — Telephone Encounter (Signed)
[facility] faxed a note stating they are unable to get Testosterone Cyp 100mg /ml inj, 87ml. Questioned if this could be changed as they have a lot of 200mg .  Please call [phone].

## 2016-11-08 NOTE — Telephone Encounter (Signed)
Pharmacy has been notified that this is ok Patient notified to only take 1/2 dose than normal due to change. Patient verbalized understanding.

## 2016-11-08 NOTE — Telephone Encounter (Signed)
That is fine.   Inform the patient that he is going to take half of what he normally does, since the prescription dose has changed

## 2016-11-11 ENCOUNTER — Other Ambulatory Visit: Payer: Self-pay | Admitting: Adult Health

## 2016-11-11 DIAGNOSIS — H40053 Ocular hypertension, bilateral: Secondary | ICD-10-CM | POA: Diagnosis not present

## 2016-11-11 DIAGNOSIS — R42 Dizziness and giddiness: Secondary | ICD-10-CM

## 2016-11-11 DIAGNOSIS — H5210 Myopia, unspecified eye: Secondary | ICD-10-CM | POA: Diagnosis not present

## 2016-11-18 ENCOUNTER — Other Ambulatory Visit: Payer: BLUE CROSS/BLUE SHIELD

## 2016-11-18 ENCOUNTER — Ambulatory Visit
Admission: RE | Admit: 2016-11-18 | Discharge: 2016-11-18 | Disposition: A | Discharge status: Home / Self Care | Payer: BLUE CROSS/BLUE SHIELD | Source: Ambulatory Visit | Attending: Adult Health | Admitting: Adult Health

## 2016-11-18 DIAGNOSIS — R42 Dizziness and giddiness: Secondary | ICD-10-CM | POA: Diagnosis not present

## 2016-11-18 DIAGNOSIS — R51 Headache: Secondary | ICD-10-CM | POA: Diagnosis not present

## 2017-01-12 DIAGNOSIS — R7989 Other specified abnormal findings of blood chemistry: Secondary | ICD-10-CM | POA: Diagnosis not present

## 2017-01-12 DIAGNOSIS — G4700 Insomnia, unspecified: Secondary | ICD-10-CM | POA: Diagnosis not present

## 2017-01-12 DIAGNOSIS — R5383 Other fatigue: Secondary | ICD-10-CM | POA: Diagnosis not present

## 2017-01-12 DIAGNOSIS — K589 Irritable bowel syndrome without diarrhea: Secondary | ICD-10-CM | POA: Diagnosis not present

## 2017-01-12 DIAGNOSIS — E559 Vitamin D deficiency, unspecified: Secondary | ICD-10-CM | POA: Diagnosis not present

## 2017-01-19 DIAGNOSIS — M9902 Segmental and somatic dysfunction of thoracic region: Secondary | ICD-10-CM | POA: Diagnosis not present

## 2017-01-19 DIAGNOSIS — M545 Low back pain: Secondary | ICD-10-CM | POA: Diagnosis not present

## 2017-01-19 DIAGNOSIS — M9904 Segmental and somatic dysfunction of sacral region: Secondary | ICD-10-CM | POA: Diagnosis not present

## 2017-01-19 DIAGNOSIS — M9903 Segmental and somatic dysfunction of lumbar region: Secondary | ICD-10-CM | POA: Diagnosis not present

## 2017-01-20 DIAGNOSIS — M9904 Segmental and somatic dysfunction of sacral region: Secondary | ICD-10-CM | POA: Diagnosis not present

## 2017-01-20 DIAGNOSIS — M9903 Segmental and somatic dysfunction of lumbar region: Secondary | ICD-10-CM | POA: Diagnosis not present

## 2017-01-20 DIAGNOSIS — M545 Low back pain: Secondary | ICD-10-CM | POA: Diagnosis not present

## 2017-01-20 DIAGNOSIS — M9902 Segmental and somatic dysfunction of thoracic region: Secondary | ICD-10-CM | POA: Diagnosis not present

## 2017-01-26 DIAGNOSIS — R7989 Other specified abnormal findings of blood chemistry: Secondary | ICD-10-CM | POA: Diagnosis not present

## 2017-01-26 DIAGNOSIS — B379 Candidiasis, unspecified: Secondary | ICD-10-CM | POA: Diagnosis not present

## 2017-01-26 DIAGNOSIS — Z719 Counseling, unspecified: Secondary | ICD-10-CM | POA: Diagnosis not present

## 2017-01-26 DIAGNOSIS — K589 Irritable bowel syndrome without diarrhea: Secondary | ICD-10-CM | POA: Diagnosis not present

## 2017-01-26 DIAGNOSIS — R5383 Other fatigue: Secondary | ICD-10-CM | POA: Diagnosis not present

## 2017-04-16 DIAGNOSIS — R05 Cough: Secondary | ICD-10-CM | POA: Diagnosis not present

## 2017-05-05 DIAGNOSIS — E221 Hyperprolactinemia: Secondary | ICD-10-CM | POA: Diagnosis not present

## 2017-05-05 DIAGNOSIS — R5383 Other fatigue: Secondary | ICD-10-CM | POA: Diagnosis not present

## 2017-05-15 DIAGNOSIS — H40053 Ocular hypertension, bilateral: Secondary | ICD-10-CM | POA: Diagnosis not present

## 2017-05-15 DIAGNOSIS — M542 Cervicalgia: Secondary | ICD-10-CM | POA: Diagnosis not present

## 2017-05-15 DIAGNOSIS — M9902 Segmental and somatic dysfunction of thoracic region: Secondary | ICD-10-CM | POA: Diagnosis not present

## 2017-05-15 DIAGNOSIS — M9901 Segmental and somatic dysfunction of cervical region: Secondary | ICD-10-CM | POA: Diagnosis not present

## 2017-05-15 DIAGNOSIS — H31001 Unspecified chorioretinal scars, right eye: Secondary | ICD-10-CM | POA: Diagnosis not present

## 2017-05-15 DIAGNOSIS — H2511 Age-related nuclear cataract, right eye: Secondary | ICD-10-CM | POA: Diagnosis not present

## 2017-05-15 DIAGNOSIS — H43813 Vitreous degeneration, bilateral: Secondary | ICD-10-CM | POA: Diagnosis not present

## 2017-05-15 DIAGNOSIS — M545 Low back pain: Secondary | ICD-10-CM | POA: Diagnosis not present

## 2017-05-16 DIAGNOSIS — M542 Cervicalgia: Secondary | ICD-10-CM | POA: Diagnosis not present

## 2017-05-16 DIAGNOSIS — M9902 Segmental and somatic dysfunction of thoracic region: Secondary | ICD-10-CM | POA: Diagnosis not present

## 2017-05-16 DIAGNOSIS — M9901 Segmental and somatic dysfunction of cervical region: Secondary | ICD-10-CM | POA: Diagnosis not present

## 2017-05-16 DIAGNOSIS — M545 Low back pain: Secondary | ICD-10-CM | POA: Diagnosis not present

## 2017-05-18 DIAGNOSIS — S8992XA Unspecified injury of left lower leg, initial encounter: Secondary | ICD-10-CM | POA: Diagnosis not present

## 2017-05-18 DIAGNOSIS — M545 Low back pain: Secondary | ICD-10-CM | POA: Diagnosis not present

## 2017-05-18 DIAGNOSIS — M542 Cervicalgia: Secondary | ICD-10-CM | POA: Diagnosis not present

## 2017-05-18 DIAGNOSIS — M9902 Segmental and somatic dysfunction of thoracic region: Secondary | ICD-10-CM | POA: Diagnosis not present

## 2017-05-18 DIAGNOSIS — M7989 Other specified soft tissue disorders: Secondary | ICD-10-CM | POA: Diagnosis not present

## 2017-05-18 DIAGNOSIS — M25562 Pain in left knee: Secondary | ICD-10-CM | POA: Diagnosis not present

## 2017-05-18 DIAGNOSIS — S8002XA Contusion of left knee, initial encounter: Secondary | ICD-10-CM | POA: Diagnosis not present

## 2017-05-18 DIAGNOSIS — M9904 Segmental and somatic dysfunction of sacral region: Secondary | ICD-10-CM | POA: Diagnosis not present

## 2017-05-19 DIAGNOSIS — R5383 Other fatigue: Secondary | ICD-10-CM | POA: Diagnosis not present

## 2017-05-19 DIAGNOSIS — K589 Irritable bowel syndrome without diarrhea: Secondary | ICD-10-CM | POA: Diagnosis not present

## 2017-05-19 DIAGNOSIS — B2790 Infectious mononucleosis, unspecified without complication: Secondary | ICD-10-CM | POA: Diagnosis not present

## 2017-05-19 DIAGNOSIS — R7989 Other specified abnormal findings of blood chemistry: Secondary | ICD-10-CM | POA: Diagnosis not present

## 2017-06-30 DIAGNOSIS — H43391 Other vitreous opacities, right eye: Secondary | ICD-10-CM | POA: Diagnosis not present

## 2017-06-30 DIAGNOSIS — H43813 Vitreous degeneration, bilateral: Secondary | ICD-10-CM | POA: Diagnosis not present

## 2017-06-30 DIAGNOSIS — H31091 Other chorioretinal scars, right eye: Secondary | ICD-10-CM | POA: Diagnosis not present

## 2017-06-30 DIAGNOSIS — H35372 Puckering of macula, left eye: Secondary | ICD-10-CM | POA: Diagnosis not present

## 2017-08-17 DIAGNOSIS — M542 Cervicalgia: Secondary | ICD-10-CM | POA: Diagnosis not present

## 2017-08-17 DIAGNOSIS — M9903 Segmental and somatic dysfunction of lumbar region: Secondary | ICD-10-CM | POA: Diagnosis not present

## 2017-08-17 DIAGNOSIS — M546 Pain in thoracic spine: Secondary | ICD-10-CM | POA: Diagnosis not present

## 2017-08-17 DIAGNOSIS — M545 Low back pain: Secondary | ICD-10-CM | POA: Diagnosis not present

## 2017-08-18 DIAGNOSIS — M542 Cervicalgia: Secondary | ICD-10-CM | POA: Diagnosis not present

## 2017-08-18 DIAGNOSIS — M9903 Segmental and somatic dysfunction of lumbar region: Secondary | ICD-10-CM | POA: Diagnosis not present

## 2017-08-18 DIAGNOSIS — M546 Pain in thoracic spine: Secondary | ICD-10-CM | POA: Diagnosis not present

## 2017-08-18 DIAGNOSIS — M545 Low back pain: Secondary | ICD-10-CM | POA: Diagnosis not present

## 2017-08-22 ENCOUNTER — Telehealth: Payer: Self-pay | Admitting: Adult Health

## 2017-08-22 NOTE — Telephone Encounter (Signed)
Copied from [place] [id number]. Topic: Quick Communication - See Telephone Encounter >> Aug 22, 2017 11:40 AM Ether Griffins B wrote: CRM for notification. See Telephone encounter for: 08/22/17. Pt is needing syringes called in to [id number] - [place], [place] - [address]

## 2017-08-22 NOTE — Telephone Encounter (Signed)
Isaiah Cordova, you have not prescribed in the past.  Ok to send in?

## 2017-08-22 NOTE — Telephone Encounter (Signed)
That is fine 

## 2017-08-23 MED ORDER — "SYRINGE/NEEDLE (DISP) 22G X 1-1/2"" 3 ML MISC": 0 refills | Status: DC

## 2017-08-23 NOTE — Telephone Encounter (Signed)
Sent to the pharmacy by e-scribe. 

## 2017-08-25 DIAGNOSIS — H524 Presbyopia: Secondary | ICD-10-CM | POA: Diagnosis not present

## 2017-08-25 DIAGNOSIS — H2511 Age-related nuclear cataract, right eye: Secondary | ICD-10-CM | POA: Diagnosis not present

## 2017-08-25 DIAGNOSIS — H31001 Unspecified chorioretinal scars, right eye: Secondary | ICD-10-CM | POA: Diagnosis not present

## 2017-09-11 ENCOUNTER — Telehealth: Payer: Self-pay | Admitting: Family Medicine

## 2017-09-11 NOTE — Telephone Encounter (Signed)
That is fine. I wish him the best.

## 2017-09-11 NOTE — Telephone Encounter (Signed)
Copied from [place] [id number]. Topic: Appointment Scheduling - Scheduling Inquiry for Clinic >> Sep 11, 2017  8:41 AM Isaiah Cordova wrote: Reason for CRM: Patient would like to Transfer to [facility] since it is closer for him. Call back is [phone] (M)

## 2017-09-12 NOTE — Telephone Encounter (Signed)
I called and left message on patient voicemail that transfer was approved. I left message that patient can call our office at his convenience to schedule a appointment at [facility]. *[facility] - ok to schedule appointment for patient at  [facility].

## 2017-11-03 DIAGNOSIS — M7542 Impingement syndrome of left shoulder: Secondary | ICD-10-CM | POA: Diagnosis not present

## 2017-11-06 DIAGNOSIS — M79671 Pain in right foot: Secondary | ICD-10-CM | POA: Diagnosis not present

## 2017-11-07 ENCOUNTER — Ambulatory Visit: Payer: BLUE CROSS/BLUE SHIELD | Admitting: Family Medicine

## 2017-11-07 ENCOUNTER — Encounter: Payer: Self-pay | Admitting: Family Medicine

## 2017-11-07 VITALS — BP 128/80 | HR 74 | Temp 98.4°F | Ht 73.25 in | Wt 200.2 lb

## 2017-11-07 DIAGNOSIS — Z91018 Allergy to other foods: Secondary | ICD-10-CM | POA: Diagnosis not present

## 2017-11-07 DIAGNOSIS — Z0000 Encounter for general adult medical examination without abnormal findings: Secondary | ICD-10-CM | POA: Diagnosis not present

## 2017-11-07 DIAGNOSIS — K580 Irritable bowel syndrome with diarrhea: Secondary | ICD-10-CM

## 2017-11-07 DIAGNOSIS — E291 Testicular hypofunction: Secondary | ICD-10-CM | POA: Diagnosis not present

## 2017-11-07 HISTORY — DX: Allergy to other foods: Z91.018

## 2017-11-07 HISTORY — DX: Irritable bowel syndrome with diarrhea: K58.0

## 2017-11-07 HISTORY — DX: Testicular hypofunction: E29.1

## 2017-11-07 LAB — URINALYSIS, ROUTINE W REFLEX MICROSCOPIC
BILIRUBIN URINE: NEGATIVE
HGB URINE DIPSTICK: NEGATIVE
KETONES UR: NEGATIVE
LEUKOCYTES UA: NEGATIVE
NITRITE: NEGATIVE
RBC / HPF: NONE SEEN (ref 0–?)
Specific Gravity, Urine: 1.025 (ref 1.000–1.030)
Total Protein, Urine: NEGATIVE
URINE GLUCOSE: NEGATIVE
UROBILINOGEN UA: 0.2 (ref 0.0–1.0)
pH: 6 (ref 5.0–8.0)

## 2017-11-07 LAB — CBC
HCT: 44.7 % (ref 39.0–52.0)
Hemoglobin: 15.2 g/dL (ref 13.0–17.0)
MCHC: 34.1 g/dL (ref 30.0–36.0)
MCV: 89.6 fl (ref 78.0–100.0)
PLATELETS: 293 10*3/uL (ref 150.0–400.0)
RBC: 4.99 Mil/uL (ref 4.22–5.81)
RDW: 13.4 % (ref 11.5–15.5)
WBC: 8.8 10*3/uL (ref 4.0–10.5)

## 2017-11-07 LAB — TESTOSTERONE: TESTOSTERONE: 116.3 ng/dL — AB (ref 300.00–890.00)

## 2017-11-07 LAB — COMPREHENSIVE METABOLIC PANEL
ALT: 15 U/L (ref 0–53)
AST: 17 U/L (ref 0–37)
Albumin: 4.8 g/dL (ref 3.5–5.2)
Alkaline Phosphatase: 76 U/L (ref 39–117)
BUN: 16 mg/dL (ref 6–23)
CALCIUM: 9.8 mg/dL (ref 8.4–10.5)
CHLORIDE: 104 meq/L (ref 96–112)
CO2: 28 meq/L (ref 19–32)
CREATININE: 0.86 mg/dL (ref 0.40–1.50)
GFR: 101.02 mL/min (ref >60.00)
Glucose, Bld: 101 mg/dL — ABNORMAL HIGH (ref 70–99)
Potassium: 4 mEq/L (ref 3.5–5.1)
Sodium: 140 mEq/L (ref 135–145)
Total Bilirubin: 2 mg/dL — ABNORMAL HIGH (ref 0.2–1.2)
Total Protein: 6.9 g/dL (ref 6.0–8.3)

## 2017-11-07 LAB — PSA: PSA: 0.93 ng/mL (ref 0.10–4.00)

## 2017-11-07 MED ORDER — LOPERAMIDE HCL 2 MG PO CAPS: 2.0000 mg | ORAL_CAPSULE | Freq: Two times a day (BID) | ORAL | 2 refills | Status: DC | PRN

## 2017-11-07 MED ORDER — TESTOSTERONE CYPIONATE 100 MG/ML IM SOLN: INTRAMUSCULAR | 2 refills | Status: DC

## 2017-11-07 NOTE — Patient Instructions (Signed)
Irritable Bowel Syndrome, Adult Irritable bowel syndrome (IBS) is not one specific disease. It is a group of symptoms that affects the organs responsible for digestion (gastrointestinal or GI tract). To regulate how your GI tract works, your body sends signals back and forth between your intestines and your brain. If you have IBS, there may be a problem with these signals. As a result, your GI tract does not function normally. Your intestines may become more sensitive and overreact to certain things. This is especially true when you eat certain foods or when you are under stress. There are four types of IBS. These may be determined based on the consistency of your stool:  IBS with diarrhea.  IBS with constipation.  Mixed IBS.  Unsubtyped IBS.  It is important to know which type of IBS you have. Some treatments are more likely to be helpful for certain types of IBS. What are the causes? The exact cause of IBS is not known. What increases the risk? You may have a higher risk of IBS if:  You are a woman.  You are younger than 48 years old.  You have a family history of IBS.  You have mental health problems.  You have had bacterial infection of your GI tract.  What are the signs or symptoms? Symptoms of IBS vary from person to person. The main symptom is abdominal pain or discomfort. Additional symptoms usually include one or more of the following:  Diarrhea, constipation, or both.  Abdominal swelling or bloating.  Feeling full or sick after eating a small or regular-size meal.  Frequent gas.  Mucus in the stool.  A feeling of having more stool left after a bowel movement.  Symptoms tend to come and go. They may be associated with stress, psychiatric conditions, or nothing at all. How is this diagnosed? There is no specific test to diagnose IBS. Your health care provider will make a diagnosis based on a physical exam, medical history, and your symptoms. You may have other  tests to rule out other conditions that may be causing your symptoms. These may include:  Blood tests.  X-rays.  CT scan.  Endoscopy and colonoscopy. This is a test in which your GI tract is viewed with a long, thin, flexible tube.  How is this treated? There is no cure for IBS, but treatment can help relieve symptoms. IBS treatment often includes:  Changes to your diet, such as: ? Eating more fiber. ? Avoiding foods that cause symptoms. ? Drinking more water. ? Eating regular, medium-sized portioned meals.  Medicines. These may include: ? Fiber supplements if you have constipation. ? Medicine to control diarrhea (antidiarrheal medicines). ? Medicine to help control muscle spasms in your GI tract (antispasmodic medicines). ? Medicines to help with any mental health issues, such as antidepressants or tranquilizers.  Therapy. ? Talk therapy may help with anxiety, depression, or other mental health issues that can make IBS symptoms worse.  Stress reduction. ? Managing your stress can help keep symptoms under control.  Follow these instructions at home:  Take medicines only as directed by your health care provider.  Eat a healthy diet. ? Avoid foods and drinks with added sugar. ? Include more whole grains, fruits, and vegetables gradually into your diet. This may be especially helpful if you have IBS with constipation. ? Avoid any foods and drinks that make your symptoms worse. These may include dairy products and caffeinated or carbonated drinks. ? Do not eat large meals. ? Drink enough   fluid to keep your urine clear or pale yellow.  Exercise regularly. Ask your health care provider for recommendations of good activities for you.  Keep all follow-up visits as directed by your health care provider. This is important. Contact a health care provider if:  You have constant pain.  You have trouble or pain with swallowing.  You have worsening diarrhea. Get help right away  if:  You have severe and worsening abdominal pain.  You have diarrhea and: ? You have a rash, stiff neck, or severe headache. ? You are irritable, sleepy, or difficult to awaken. ? You are weak, dizzy, or extremely thirsty.  You have bright red blood in your stool or you have black tarry stools.  You have unusual abdominal swelling that is painful.  You vomit continuously.  You vomit blood (hematemesis).  You have both abdominal pain and a fever. This information is not intended to replace advice given to you by your health care provider. Make sure you discuss any questions you have with your health care provider. Document Released: 05/16/2005 Document Revised: 10/16/2015 Document Reviewed: 01/31/2014 Elsevier Interactive Patient Education  2018 [employer]. Loperamide tablets or capsules What is this medicine? LOPERAMIDE (loe PER a mide) is used to treat diarrhea. This medicine may be used for other purposes; ask your health care provider or pharmacist if you have questions. COMMON BRAND NAME(S): Anti-Diarrheal, Imodium A-D, K-Pek II What should I tell my health care provider before I take this medicine? They need to know if you have any of these conditions: -a black or bloody stool -bacterial food poisoning -colitis or mucus in your stool -currently taking an antibiotic medication for an infection -fever -liver disease -severe abdominal pain, swelling or bulging -an unusual or allergic reaction to loperamide, other medicines, foods, dyes, or preservatives -pregnant or trying to get pregnant -breast-feeding How should I use this medicine? Take this medicine by mouth with a glass of water. Follow the directions on the prescription label. Take your doses at regular intervals. Do not take your medicine more often than directed. Talk to your pediatrician regarding the use of this medicine in children. Special care may be needed. Overdosage: If you think you have taken too much  of this medicine contact a poison control center or emergency room at once. NOTE: This medicine is only for you. Do not share this medicine with others. What if I miss a dose? This does not apply. This medicine is not for regular use. Only take this medicine while you continue to have loose bowel movements. Do not take more medicine than recommended by the packaging label or by your healthcare professional. What may interact with this medicine? Do not take this medicine with any of the following medications: - alosetron This medicine may also interact with the following medications: -cimetidine -clarithromycin -erythromycin -gemfibrozil -itraconazole -ketoconazole -quinidine -quinine -ranitidine -ritonavir -saquinavir This list may not describe all possible interactions. Give your health care provider a list of all the medicines, herbs, non-prescription drugs, or dietary supplements you use. Also tell them if you smoke, drink alcohol, or use illegal drugs. Some items may interact with your medicine. What should I watch for while using this medicine? Do not take this medicine for more than 2 days without asking your doctor or health care professional. Do not use doses higher than those prescribed by your doctor or listed on the label. Check with your doctor or health care professional right away if you develop a fever, severe abdominal pain,  swelling or bulging, or if you have have bloody/black diarrhea or stools. You may get drowsy or dizzy. Do not drive, use machinery, or do anything that needs mental alertness until you know how this medicine affects you. Do not stand or sit up quickly, especially if you are an older patient. This reduces the risk of dizzy or fainting spells. Alcohol can increase possible drowsiness and dizziness. Avoid alcoholic drinks. Your mouth may get dry. Chewing sugarless gum or sucking hard candy, and drinking plenty of water may help. Contact your doctor if the  problem does not go away or is severe. Drinking plenty of water can also help prevent dehydration that can occur with diarrhea. Elderly patients may have a more variable response to the effects of this medicine, and are more susceptible to the effects of dehydration. What side effects may I notice from receiving this medicine? Side effects that you should report to your doctor or health care professional as soon as possible: - allergic reactions like skin rash, itching or hives, swelling of the face, lips, or tongue -bloated, swollen feeling in your abdomen -blurred vision -loss of appetite -signs and symptoms of a dangerous change in heartbeat or heart rhythm like chest pain; dizziness; fast or irregular heartbeat palpitations; feeling faint or lightheaded, falls; breathing problems -stomach pain Side effects that usually do not require medical attention (report to your doctor or health care professional if they continue or are bothersome): - constipation -drowsiness or dizziness -dry mouth -nausea, vomiting This list may not describe all possible side effects. Call your doctor for medical advice about side effects. You may report side effects to FDA at 1-800-FDA-1088. Where should I keep my medicine? Keep out of the reach of children. Store at room temperature between 15 and 25 degrees C (59 and 77 degrees F). Keep container tightly closed. Throw away any unused medicine after the expiration date. NOTE: This sheet is a summary. It may not cover all possible information. If you have questions about this medicine, talk to your doctor, pharmacist, or health care provider.  2018 Elsevier/Gold Standard (2014-11-11 15:29:29)

## 2017-11-07 NOTE — Progress Notes (Signed)
Subjective:  Patient ID: Isaiah Cordova, male    DOB: 1970/07/24  Age: 47 y.o. MRN: [id number]  CC: Establish Care   HPI Dillon Livermore Suchy presents for establishment of care and for continuation of his testosterone therapy.  He has been out for a month or so and his energy levels have decreased significantly.  He has taken Depakote testosterone for years now.  And his has definitely helped his energy.  He is scheduled for cataract extraction in a week or so.  Significant past medical history of glaucoma with a detached retina of his right eye.  He has suffered multiple head trauma over the years of his life for one reason or another.  He feels as though he has some memory issues possibly associated with this.  MRI of his brain obtained in June of last year was read as normal.  He has an ongoing history of irritable bowel syndrome associated with loose stool or diarrhea.  He denies crampy abdominal pain with this or blood in his stool.  It does seem to be exacerbated by stress.  He is status post 2 colonoscopies and tested negative for inflammatory bowel disease he tells me.  He had tested positive for multiple food allergies when he saw a naturopath last year.  They tried herbal supplements to treat his irritable bowel that did not seem to help much.  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  He lives with his wife.  He does not smoke or use illicit drugs.  He drinks alcohol on occasion.  He does exercise almost on a daily basis.  He does have a history of dysthymia but is never been treated for depression.  Outpatient Medications Prior to Visit  Medication Sig Dispense Refill  . Cyanocobalamin (B-12 PO) Take by mouth. 1 tab daily    . latanoprost (XALATAN) 0.005 % ophthalmic solution INT 1 GTT INTO OU HS  4  . moxifloxacin (VIGAMOX) 0.5 % ophthalmic solution INT 1 GTT INTO OD QID STARTING 2 DAYS PRIOR TO SURGERY AND 2 GTS MORNING OF SURGERY  0  . Multiple Vitamins-Minerals (ONE-A-DAY MENS  HEALTH FORMULA PO) Take 1 tablet by mouth daily.    . Omega-3 Fatty Acids (FISH OIL) 1000 MG CAPS Take 1,000 mg by mouth daily.    . prednisoLONE acetate (PRED FORTE) 1 % ophthalmic suspension   0  . timolol (TIMOPTIC) 0.25 % ophthalmic solution Place 1 drop into the left eye daily.      . travoprost, benzalkonium, (TRAVATAN) 0.004 % ophthalmic solution Place 1 drop into both eyes at bedtime.      Marland Kitchen NEEDLE, DISP, 20 G (BD DISP NEEDLES) 20G X 1" MISC 1 each by Does not apply route every 30 (thirty) days. To draw up med 100 each 11  . omeprazole (PRILOSEC OTC) 20 MG tablet Take 20 mg by mouth daily as needed.    . SYRINGE-NEEDLE, DISP, 3 ML (B-D 3CC LUER-LOK SYR 22GX1-1/2) 22G X 1-1/2" 3 ML MISC USE AS DIRECTED 100 each 0  . testosterone cypionate (DEPOTESTOTERONE CYPIONATE) 100 MG/ML injection INJECT 1.5 ML INTRAMUSCULARLY EVERY 14 DAYS (Patient not taking: Reported on 11/03/2016) 10 mL 0   No facility-administered medications prior to visit.     ROS Review of Systems  Constitutional: Positive for fatigue. Negative for chills, fever and unexpected weight change.  HENT: Negative.   Eyes: Negative.   Respiratory: Negative.   Cardiovascular: Negative.   Gastrointestinal: Positive for diarrhea. Negative for  abdominal distention, abdominal pain, anal bleeding, blood in stool, constipation, nausea, rectal pain and vomiting.  Endocrine: Negative for cold intolerance and heat intolerance.  Genitourinary: Negative.   Musculoskeletal: Negative.   Skin: Negative for pallor and rash.  Allergic/Immunologic: Negative for immunocompromised state.  Neurological: Negative for weakness and headaches.  Hematological: Does not bruise/bleed easily.  Psychiatric/Behavioral: Positive for dysphoric mood. Negative for self-injury, sleep disturbance and suicidal ideas. The patient is nervous/anxious.     Objective:  BP 128/80   Pulse 74   Temp 98.4 F (36.9 C)   Ht 6' 1.25" (1.861 m)   Wt 200 lb 4 oz (90.8  kg)   SpO2 97%   BMI 26.24 kg/m   BP Readings from Last 3 Encounters:  11/07/17 128/80  11/03/16 110/72  10/28/16 122/70    Wt Readings from Last 3 Encounters:  11/07/17 200 lb 4 oz (90.8 kg)  11/03/16 198 lb (89.8 kg)  10/28/16 199 lb 4.8 oz (90.4 kg)    Physical Exam  Constitutional: He is oriented to person, place, and time. He appears well-developed and well-nourished. No distress.  HENT:  Head: Normocephalic and atraumatic.  Right Ear: External ear normal.  Left Ear: External ear normal.  Mouth/Throat: Oropharynx is clear and moist. No oropharyngeal exudate.  Eyes: Pupils are equal, round, and reactive to light. Conjunctivae and EOM are normal. Right eye exhibits no discharge. Left eye exhibits no discharge. No scleral icterus.  Neck: Normal range of motion. Neck supple. No JVD present. No tracheal deviation present. No thyromegaly present.  Cardiovascular: Normal rate, regular rhythm and normal heart sounds.  Pulmonary/Chest: Effort normal and breath sounds normal. No stridor. No respiratory distress. He has no wheezes. He has no rales.  Abdominal: Soft. Bowel sounds are normal. He exhibits no distension. There is no tenderness. There is no guarding.  Musculoskeletal: He exhibits no edema or deformity.  Neurological: He is alert and oriented to person, place, and time.  Skin: Skin is warm and dry. He is not diaphoretic.  Psychiatric: He has a normal mood and affect. His behavior is normal.    Lab Results  Component Value Date   WBC 5.8 10/28/2016   HGB 14.8 10/28/2016   HCT 44.5 10/28/2016   PLT 268.0 10/28/2016   GLUCOSE 92 11/03/2016   CHOL 224 (H) 11/03/2016   TRIG 150.0 (H) 11/03/2016   HDL 55.20 11/03/2016   LDLCALC 139 (H) 11/03/2016   ALT 17 11/03/2016   AST 20 11/03/2016   NA 140 11/03/2016   K 4.7 11/03/2016   CL 101 11/03/2016   CREATININE 0.95 11/03/2016   BUN 19 11/03/2016   CO2 31 11/03/2016   TSH 1.41 10/28/2016   PSA 0.96 11/03/2016    HGBA1C 5.2 10/28/2011    Mr Brain Wo Contrast  Result Date: 11/18/2016 CLINICAL DATA:  Daily headaches with dizziness. History of concussion. EXAM: MRI HEAD WITHOUT CONTRAST TECHNIQUE: Multiplanar, multiecho pulse sequences of the brain and surrounding structures were obtained without intravenous contrast. COMPARISON:  Brain MRI 11/22/2011 FINDINGS: Brain: The midline structures are normal. There is no focal diffusion restriction to indicate acute infarct. The brain parenchymal signal is normal and there is no mass lesion. No intraparenchymal hematoma or chronic microhemorrhage. Brain volume is normal for age without age-advanced or lobar predominant atrophy. The dura is normal and there is no extra-axial collection. Vascular: Major intracranial arterial and venous sinus flow voids are preserved. Skull and upper cervical spine: The visualized skull base, calvarium, upper cervical  spine and extracranial soft tissues are normal. Sinuses/Orbits: No fluid levels or advanced mucosal thickening. No mastoid effusion. Normal orbits. IMPRESSION: Normal brain MRI. Electronically Signed   By: Ulyses Jarred M.D.   On: 11/18/2016 15:17    Assessment & Plan:   Jamire was seen today for establish care.  Diagnoses and all orders for this visit:  Androgen deficiency -     Testosterone -     testosterone cypionate (DEPOTESTOTERONE CYPIONATE) 100 MG/ML injection; INJECT 1.5 ML INTRAMUSCULARLY EVERY 14 DAYS  Irritable bowel syndrome with diarrhea -     CBC -     loperamide (IMODIUM) 2 MG capsule; Take 1 capsule (2 mg total) by mouth 2 (two) times daily as needed for diarrhea or loose stools.  Health care maintenance -     CBC -     Comprehensive metabolic panel -     PSA -     Urinalysis, Routine w reflex microscopic  Food allergy -     Food Allergy Profile   I have discontinued Jaydee P. Matas's NEEDLE (DISP) 20 G, omeprazole, and SYRINGE-NEEDLE (DISP) 3 ML. I am also having him start on loperamide.  Additionally, I am having him maintain his travoprost (benzalkonium), timolol, Cyanocobalamin (B-12 PO), Multiple Vitamins-Minerals (ONE-A-DAY MENS HEALTH FORMULA PO), Fish Oil, latanoprost, moxifloxacin, prednisoLONE acetate, and testosterone cypionate.  Meds ordered this encounter  Medications  . testosterone cypionate (DEPOTESTOTERONE CYPIONATE) 100 MG/ML injection    Sig: INJECT 1.5 ML INTRAMUSCULARLY EVERY 14 DAYS    Dispense:  10 mL    Refill:  2  . loperamide (IMODIUM) 2 MG capsule    Sig: Take 1 capsule (2 mg total) by mouth 2 (two) times daily as needed for diarrhea or loose stools.    Dispense:  30 capsule    Refill:  2   He will follow-up in 3 months for a physical exam.  Follow-up: Return in about 3 months (around 02/07/2018).  Libby Maw, MD

## 2017-11-08 LAB — FOOD ALLERGY PROFILE
ALMONDS: 0.15 kU/L — AB
Allergen, Salmon, f41: <0.1 kU/L
CLASS: 0
CLASS: 0
CLASS: 0
CLASS: 0
CLASS: 0
CLASS: 0
CLASS: 0
CLASS: 0
CLASS: 0
CLASS: 0
CLASS: 0
CLASS: 0
CLASS: 0
CLASS: 0
Cashew IgE: 0.11 kU/L — ABNORMAL HIGH
Class: 0
Egg White IgE: <0.1 kU/L
Fish Cod: <0.1 kU/L
HAZELNUT: 0.13 kU/L — AB
Peanut IgE: 0.14 kU/L — ABNORMAL HIGH
SHRIMP IGE: 0.1 kU/L — AB
Scallop IgE: 0.15 kU/L — ABNORMAL HIGH
Sesame Seed f10: 0.16 kU/L — ABNORMAL HIGH
Soybean IgE: 0.13 kU/L — ABNORMAL HIGH
Tuna IgE: <0.1 kU/L
WALNUT: 0.14 kU/L — AB
WHEAT IGE: 0.17 kU/L — AB

## 2017-11-08 LAB — INTERPRETATION:

## 2017-11-09 DIAGNOSIS — H25011 Cortical age-related cataract, right eye: Secondary | ICD-10-CM | POA: Diagnosis not present

## 2017-11-09 DIAGNOSIS — H25811 Combined forms of age-related cataract, right eye: Secondary | ICD-10-CM | POA: Diagnosis not present

## 2017-11-09 DIAGNOSIS — H2511 Age-related nuclear cataract, right eye: Secondary | ICD-10-CM | POA: Diagnosis not present

## 2018-01-04 DIAGNOSIS — H25812 Combined forms of age-related cataract, left eye: Secondary | ICD-10-CM | POA: Diagnosis not present

## 2018-01-04 DIAGNOSIS — H2512 Age-related nuclear cataract, left eye: Secondary | ICD-10-CM | POA: Diagnosis not present

## 2018-01-05 DIAGNOSIS — H35351 Cystoid macular degeneration, right eye: Secondary | ICD-10-CM | POA: Diagnosis not present

## 2018-01-17 ENCOUNTER — Ambulatory Visit: Payer: BLUE CROSS/BLUE SHIELD | Admitting: Family Medicine

## 2018-01-17 ENCOUNTER — Encounter: Payer: Self-pay | Admitting: Family Medicine

## 2018-01-17 VITALS — BP 120/76 | HR 80 | Ht 73.25 in | Wt 196.5 lb

## 2018-01-17 DIAGNOSIS — E291 Testicular hypofunction: Secondary | ICD-10-CM

## 2018-01-17 DIAGNOSIS — Z5181 Encounter for therapeutic drug level monitoring: Secondary | ICD-10-CM

## 2018-01-17 DIAGNOSIS — K580 Irritable bowel syndrome with diarrhea: Secondary | ICD-10-CM | POA: Diagnosis not present

## 2018-01-17 DIAGNOSIS — D229 Melanocytic nevi, unspecified: Secondary | ICD-10-CM | POA: Diagnosis not present

## 2018-01-17 DIAGNOSIS — Z0000 Encounter for general adult medical examination without abnormal findings: Secondary | ICD-10-CM | POA: Diagnosis not present

## 2018-01-17 DIAGNOSIS — Q381 Ankyloglossia: Secondary | ICD-10-CM

## 2018-01-17 NOTE — Patient Instructions (Signed)

## 2018-01-17 NOTE — Progress Notes (Signed)
Subjective:  Patient ID: Isaiah Cordova, male    DOB: 04-25-1971  Age: 47 y.o. MRN: [id number]  CC: Follow-up   HPI Isaiah Cordova presents for a complete physical exam and follow-up of his testosterone therapy.  He is status post bilateral cataract extraction and those procedures are going well for him.  Distance vision is excellent now.  He is noticed swelling at the base of his tongue where it connects to his mouth.  This is new for him.  He admits that he uses the testosterone intermittently he feels as though he needs an energy boost.  He does not like giving himself injections and his wife does not like doing that either.  Nonfasting today.  He has had his skin looked at 10 or 15 years ago but not since.  He has no family history of dermatological malignancy that he is aware of.  He has not had to use the loperamide as of yet.  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  Outpatient Medications Prior to Visit  Medication Sig Dispense Refill  . Cyanocobalamin (B-12 PO) Take by mouth. 1 tab daily    . latanoprost (XALATAN) 0.005 % ophthalmic solution INT 1 GTT INTO OU HS  4  . loperamide (IMODIUM) 2 MG capsule Take 1 capsule (2 mg total) by mouth 2 (two) times daily as needed for diarrhea or loose stools. 30 capsule 2  . moxifloxacin (VIGAMOX) 0.5 % ophthalmic solution INT 1 GTT INTO OD QID STARTING 2 DAYS PRIOR TO SURGERY AND 2 GTS MORNING OF SURGERY  0  . Multiple Vitamins-Minerals (ONE-A-DAY MENS HEALTH FORMULA PO) Take 1 tablet by mouth daily.    . Omega-3 Fatty Acids (FISH OIL) 1000 MG CAPS Take 1,000 mg by mouth daily.    . prednisoLONE acetate (PRED FORTE) 1 % ophthalmic suspension   0  . testosterone cypionate (DEPOTESTOTERONE CYPIONATE) 100 MG/ML injection INJECT 1.5 ML INTRAMUSCULARLY EVERY 14 DAYS 10 mL 2  . timolol (TIMOPTIC) 0.25 % ophthalmic solution Place 1 drop into the left eye daily.      .  travoprost, benzalkonium, (TRAVATAN) 0.004 % ophthalmic solution Place 1 drop into both eyes at bedtime.       No facility-administered medications prior to visit.     ROS Review of Systems  Constitutional: Negative.   HENT: Negative.   Eyes: Negative for photophobia and visual disturbance.  Respiratory: Negative for cough and wheezing.   Cardiovascular: Negative.   Gastrointestinal: Negative.   Endocrine: Negative for polyphagia and polyuria.  Genitourinary: Negative for difficulty urinating, frequency and urgency.  Musculoskeletal: Negative.   Skin: Negative for pallor.  Allergic/Immunologic: Negative for immunocompromised state.  Neurological: Negative.   Hematological: Does not bruise/bleed easily.  Psychiatric/Behavioral: Negative.     Objective:  BP 120/76   Pulse 80   Ht 6' 1.25" (1.861 m)   Wt 196 lb 8 oz (89.1 kg)   SpO2 98%   BMI 25.75 kg/m   BP Readings from Last 3 Encounters:  01/17/18 120/76  11/07/17 128/80  11/03/16 110/72    Wt Readings from Last 3 Encounters:  01/17/18 196 lb 8 oz (89.1 kg)  11/07/17 200 lb 4 oz (90.8 kg)  11/03/16 198 lb (89.8 kg)    Physical Exam  Constitutional: He is oriented to person, place, and time. He appears well-developed and well-nourished. No distress.  HENT:  Head: Normocephalic and atraumatic.  Right Ear: External ear normal.  Left Ear: External ear normal.  Nose: Nose normal.  Mouth/Throat: Oropharynx is clear and moist. No oropharyngeal exudate.    Eyes: Pupils are equal, round, and reactive to light. Conjunctivae and EOM are normal. Right eye exhibits no discharge. Left eye exhibits no discharge. No scleral icterus.  Neck: Normal range of motion. Neck supple. No JVD present. No tracheal deviation present. No thyromegaly present.  Cardiovascular: Normal rate, regular rhythm and normal heart sounds.  Pulmonary/Chest: Effort normal and breath sounds normal.  Abdominal: Soft. Bowel sounds are normal. He exhibits  no distension and no mass. There is no tenderness. There is no guarding. Hernia confirmed negative in the right inguinal area and confirmed negative in the left inguinal area.  Genitourinary: Penis normal. Right testis shows no mass, no swelling and no tenderness. Right testis is descended. Left testis shows no mass, no swelling and no tenderness. Left testis is descended. Circumcised. No hypospadias, penile erythema or penile tenderness. No discharge found.  Musculoskeletal: Normal range of motion. He exhibits no edema.  Lymphadenopathy:    He has no cervical adenopathy. No inguinal adenopathy noted on the right or left side.  Neurological: He is alert and oriented to person, place, and time.  Skin: Skin is warm and dry. Capillary refill takes less than 2 seconds. No rash noted. He is not diaphoretic. No erythema. No pallor.     Psychiatric: He has a normal mood and affect. His behavior is normal.    Lab Results  Component Value Date   WBC 8.8 11/07/2017   HGB 15.2 11/07/2017   HCT 44.7 11/07/2017   PLT 293.0 11/07/2017   GLUCOSE 101 (H) 11/07/2017   CHOL 224 (H) 11/03/2016   TRIG 150.0 (H) 11/03/2016   HDL 55.20 11/03/2016   LDLCALC 139 (H) 11/03/2016   ALT 15 11/07/2017   AST 17 11/07/2017   NA 140 11/07/2017   K 4.0 11/07/2017   CL 104 11/07/2017   CREATININE 0.86 11/07/2017   BUN 16 11/07/2017   CO2 28 11/07/2017   TSH 1.41 10/28/2016   PSA 0.93 11/07/2017   HGBA1C 5.2 10/28/2011    Mr Brain Wo Contrast  Result Date: 11/18/2016 CLINICAL DATA:  Daily headaches with dizziness. History of concussion. EXAM: MRI HEAD WITHOUT CONTRAST TECHNIQUE: Multiplanar, multiecho pulse sequences of the brain and surrounding structures were obtained without intravenous contrast. COMPARISON:  Brain MRI 11/22/2011 FINDINGS: Brain: The midline structures are normal. There is no focal diffusion restriction to indicate acute infarct. The brain parenchymal signal is normal and there is no mass  lesion. No intraparenchymal hematoma or chronic microhemorrhage. Brain volume is normal for age without age-advanced or lobar predominant atrophy. The dura is normal and there is no extra-axial collection. Vascular: Major intracranial arterial and venous sinus flow voids are preserved. Skull and upper cervical spine: The visualized skull base, calvarium, upper cervical spine and extracranial soft tissues are normal. Sinuses/Orbits: No fluid levels or advanced mucosal thickening. No mastoid effusion. Normal orbits. IMPRESSION: Normal brain MRI. Electronically Signed   By: Ulyses Jarred M.D.   On: 11/18/2016 15:17    Assessment & Plan:   Stanly was seen today for follow-up.  Diagnoses and all orders for this visit:  Irritable bowel syndrome with diarrhea  Androgen deficiency -     Cancel: Testosterone -     Testosterone; Future  Atypical nevi -     Ambulatory referral to Dermatology  Health care  maintenance  Frenulum linguae -     Ambulatory referral to ENT  Medication monitoring encounter -     Cancel: CBC -     CBC; Future   I am having Ki Corbo. Kimberling maintain his travoprost (benzalkonium), timolol, Cyanocobalamin (B-12 PO), Multiple Vitamins-Minerals (ONE-A-DAY MENS HEALTH FORMULA PO), Fish Oil, latanoprost, moxifloxacin, prednisoLONE acetate, testosterone cypionate, and loperamide.  No orders of the defined types were placed in this encounter.    Follow-up: Return if symptoms worsen or fail to improve.  Libby Maw, MD

## 2018-01-18 ENCOUNTER — Ambulatory Visit: Payer: BLUE CROSS/BLUE SHIELD | Admitting: Family Medicine

## 2018-02-02 DIAGNOSIS — H35351 Cystoid macular degeneration, right eye: Secondary | ICD-10-CM | POA: Diagnosis not present

## 2018-02-07 ENCOUNTER — Other Ambulatory Visit (INDEPENDENT_AMBULATORY_CARE_PROVIDER_SITE_OTHER): Payer: BLUE CROSS/BLUE SHIELD

## 2018-02-07 DIAGNOSIS — E291 Testicular hypofunction: Secondary | ICD-10-CM | POA: Diagnosis not present

## 2018-02-07 DIAGNOSIS — Z5181 Encounter for therapeutic drug level monitoring: Secondary | ICD-10-CM | POA: Diagnosis not present

## 2018-02-07 LAB — TESTOSTERONE: Testosterone: 222.4 ng/dL — ABNORMAL LOW (ref 300.00–890.00)

## 2018-02-07 LAB — CBC
HCT: 44.6 % (ref 39.0–52.0)
Hemoglobin: 15.3 g/dL (ref 13.0–17.0)
MCHC: 34.2 g/dL (ref 30.0–36.0)
MCV: 83.6 fl (ref 78.0–100.0)
Platelets: 182 K/uL (ref 150.0–400.0)
RBC: 5.34 Mil/uL (ref 4.22–5.81)
RDW: 13.2 % (ref 11.5–15.5)
WBC: 6.7 K/uL (ref 4.0–10.5)

## 2018-02-09 DIAGNOSIS — D225 Melanocytic nevi of trunk: Secondary | ICD-10-CM | POA: Diagnosis not present

## 2018-02-09 DIAGNOSIS — L814 Other melanin hyperpigmentation: Secondary | ICD-10-CM | POA: Diagnosis not present

## 2018-02-12 DIAGNOSIS — H4312 Vitreous hemorrhage, left eye: Secondary | ICD-10-CM | POA: Diagnosis not present

## 2018-02-12 DIAGNOSIS — H35372 Puckering of macula, left eye: Secondary | ICD-10-CM | POA: Diagnosis not present

## 2018-02-12 DIAGNOSIS — H33022 Retinal detachment with multiple breaks, left eye: Secondary | ICD-10-CM | POA: Diagnosis not present

## 2018-02-12 DIAGNOSIS — H43813 Vitreous degeneration, bilateral: Secondary | ICD-10-CM | POA: Diagnosis not present

## 2018-02-12 DIAGNOSIS — H33302 Unspecified retinal break, left eye: Secondary | ICD-10-CM | POA: Diagnosis not present

## 2018-02-13 DIAGNOSIS — H33022 Retinal detachment with multiple breaks, left eye: Secondary | ICD-10-CM | POA: Diagnosis not present

## 2018-02-20 DIAGNOSIS — H903 Sensorineural hearing loss, bilateral: Secondary | ICD-10-CM | POA: Diagnosis not present

## 2018-02-20 DIAGNOSIS — K1121 Acute sialoadenitis: Secondary | ICD-10-CM | POA: Diagnosis not present

## 2018-04-17 DIAGNOSIS — H33022 Retinal detachment with multiple breaks, left eye: Secondary | ICD-10-CM | POA: Diagnosis not present

## 2018-04-17 DIAGNOSIS — H31091 Other chorioretinal scars, right eye: Secondary | ICD-10-CM | POA: Diagnosis not present

## 2018-05-09 DIAGNOSIS — M9902 Segmental and somatic dysfunction of thoracic region: Secondary | ICD-10-CM | POA: Diagnosis not present

## 2018-05-09 DIAGNOSIS — M545 Low back pain: Secondary | ICD-10-CM | POA: Diagnosis not present

## 2018-05-09 DIAGNOSIS — M546 Pain in thoracic spine: Secondary | ICD-10-CM | POA: Diagnosis not present

## 2018-05-09 DIAGNOSIS — M9903 Segmental and somatic dysfunction of lumbar region: Secondary | ICD-10-CM | POA: Diagnosis not present

## 2018-05-10 DIAGNOSIS — M9902 Segmental and somatic dysfunction of thoracic region: Secondary | ICD-10-CM | POA: Diagnosis not present

## 2018-05-10 DIAGNOSIS — M546 Pain in thoracic spine: Secondary | ICD-10-CM | POA: Diagnosis not present

## 2018-05-10 DIAGNOSIS — M9903 Segmental and somatic dysfunction of lumbar region: Secondary | ICD-10-CM | POA: Diagnosis not present

## 2018-05-10 DIAGNOSIS — M545 Low back pain: Secondary | ICD-10-CM | POA: Diagnosis not present

## 2018-05-21 ENCOUNTER — Ambulatory Visit: Payer: BLUE CROSS/BLUE SHIELD | Admitting: Family Medicine

## 2018-05-21 ENCOUNTER — Encounter: Payer: Self-pay | Admitting: Family Medicine

## 2018-05-21 VITALS — BP 126/80 | HR 72 | Temp 98.3°F | Ht 73.25 in | Wt 212.5 lb

## 2018-05-21 DIAGNOSIS — J40 Bronchitis, not specified as acute or chronic: Secondary | ICD-10-CM

## 2018-05-21 DIAGNOSIS — Z8669 Personal history of other diseases of the nervous system and sense organs: Secondary | ICD-10-CM | POA: Insufficient documentation

## 2018-05-21 HISTORY — DX: Bronchitis, not specified as acute or chronic: J40

## 2018-05-21 HISTORY — DX: Personal history of other diseases of the nervous system and sense organs: Z86.69

## 2018-05-21 MED ORDER — HYDROCODONE-HOMATROPINE 5-1.5 MG/5ML PO SYRP: 5.0000 mL | ORAL_SOLUTION | Freq: Three times a day (TID) | ORAL | 0 refills | Status: DC | PRN

## 2018-05-21 MED ORDER — AZITHROMYCIN 250 MG PO TABS: ORAL_TABLET | ORAL | 0 refills | Status: DC

## 2018-05-21 NOTE — Patient Instructions (Signed)

## 2018-05-21 NOTE — Progress Notes (Signed)
Established Patient Office Visit  Subjective:  Patient ID: Isaiah Cordova, male    DOB: 02-05-1971  Age: 47 y.o. MRN: [id number]  CC:  Chief Complaint  Patient presents with  . Cough  . Fatigue    HPI [name] presents for evaluation of a cough that has been ongoing for a week. He has had no fevers and feels lethargic. He has been coughing up yellow mucus. He has been taking Mucinex every 4 hours, he feels like it has helped a little bit. The cough is worse at night, but is dry throughout the day.  Patient denies fevers chills nausea or vomiting.  He has no wheezing or history of asthma.  He does not smoke.  There is no postnasal drip facial pressure or teeth pain.  He has been using the Mucinex as indicated above.  Cough is worse at night he is getting on average a couple hours of sleep.  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  Significant past medical history of recent retinal detachment.  Was out of work for a couple of weeks high is improved now.  Past Medical History:  Diagnosis Date  . Anxiety   . Concussion   . Depression   . Gastric ulcer   . GERD (gastroesophageal reflux disease)   . Headache(784.0)    chronic  . Hemorrhoids, external   . Irritable bowel   . Neuromuscular disorder (HCC)    RLS  . Rectal bleeding    history of, negative colonoscopy 2005-2006    Past Surgical History:  Procedure Laterality Date  . COLONOSCOPY    . HERNIA REPAIR    . NASAL SEPTUM SURGERY    . RETINAL DETACHMENT SURGERY      Family History  Problem Relation Age of Onset  . Hypertension Father   . Diabetes Father   . Hyperlipidemia Father   . Prostate cancer Father   . Depression Maternal Grandfather        prostate  . Depression Paternal Grandfather        committed suicide  . Hypothyroidism Other   . Diabetes Mellitus II Brother     Social History   Socioeconomic History  . Marital status: Married    Spouse  name: [name]  . Number of children: 1  . Years of education: Not on file  . Highest education level: Not on file  Occupational History  . 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    Employer: [employer]  Social Needs  . Financial resource strain: Not on file  . Food insecurity:    Worry: Not on file    Inability: Not on file  . Transportation needs:    Medical: Not on file    Non-medical: Not on file  Tobacco Use  . Smoking status: Never Smoker  . Smokeless tobacco: Never Used  Substance and Sexual Activity  . Alcohol use: Yes    Alcohol/week: 2.0 standard drinks    Types: 2 Cans of beer per week  . Drug use: Not on file  . Sexual activity: Not on file  Lifestyle  . Physical activity:    Days per week: Not on file    Minutes per session: Not on file  . Stress: Not on file  Relationships  . Social connections:    Talks on phone: Not on file    Gets together: Not on file    Attends  religious service: Not on file    Active member of club or organization: Not on file    Attends meetings of clubs or organizations: Not on file    Relationship status: Not on file  . Intimate partner violence:    Fear of current or ex partner: Not on file    Emotionally abused: Not on file    Physically abused: Not on file    Forced sexual activity: Not on file  Other Topics Concern  . Not on file  Social History Narrative   Regular exercise - yes   1 daughter 37 years old    Outpatient Medications Prior to Visit  Medication Sig Dispense Refill  . Cyanocobalamin (B-12 PO) Take by mouth. 1 tab daily    . latanoprost (XALATAN) 0.005 % ophthalmic solution INT 1 GTT INTO OU HS  4  . loperamide (IMODIUM) 2 MG capsule Take 1 capsule (2 mg total) by mouth 2 (two) times daily as needed for diarrhea or loose stools. 30 capsule 2  . moxifloxacin (VIGAMOX) 0.5 % ophthalmic solution INT 1 GTT INTO OD QID STARTING 2 DAYS PRIOR TO SURGERY AND 2 GTS MORNING OF SURGERY  0  . Multiple Vitamins-Minerals  (ONE-A-DAY MENS HEALTH FORMULA PO) Take 1 tablet by mouth daily.    . Omega-3 Fatty Acids (FISH OIL) 1000 MG CAPS Take 1,000 mg by mouth daily.    . prednisoLONE acetate (PRED FORTE) 1 % ophthalmic suspension   0  . testosterone cypionate (DEPOTESTOTERONE CYPIONATE) 100 MG/ML injection INJECT 1.5 ML INTRAMUSCULARLY EVERY 14 DAYS 10 mL 2  . timolol (TIMOPTIC) 0.25 % ophthalmic solution Place 1 drop into the left eye daily.      . travoprost, benzalkonium, (TRAVATAN) 0.004 % ophthalmic solution Place 1 drop into both eyes at bedtime.       No facility-administered medications prior to visit.     Allergies  Allergen Reactions  . Dust Mite Extract   . Lisdexamfetamine     REACTION: chest pains  . Other     GRASS  . Pollen Extract     ROS Review of Systems  Constitutional: Negative for chills, diaphoresis, fatigue, fever and unexpected weight change.  HENT: Negative.   Eyes: Negative for photophobia and visual disturbance.  Respiratory: Negative for shortness of breath and wheezing.   Cardiovascular: Negative.   Musculoskeletal: Negative for arthralgias and myalgias.  Skin: Negative for pallor and rash.  Neurological: Negative for light-headedness, numbness and headaches.  Hematological: Negative.   Psychiatric/Behavioral: Negative.       Objective:    Physical Exam  Constitutional: He is oriented to person, place, and time. He appears well-developed and well-nourished. No distress.  HENT:  Head: Normocephalic and atraumatic.  Right Ear: External ear normal.  Left Ear: External ear normal.  Mouth/Throat: Oropharynx is clear and moist. No oropharyngeal exudate.  Eyes: Pupils are equal, round, and reactive to light. Conjunctivae are normal. Right eye exhibits no discharge. Left eye exhibits no discharge. No scleral icterus.  Neck: Neck supple. No JVD present. No tracheal deviation present. No thyromegaly present.  Cardiovascular: Normal rate, regular rhythm and normal heart  sounds.  Pulmonary/Chest: Effort normal and breath sounds normal. No respiratory distress. He has no wheezes. He has no rales.  Abdominal: Bowel sounds are normal.  Lymphadenopathy:    He has no cervical adenopathy.  Neurological: He is alert and oriented to person, place, and time.  Skin: Skin is warm and dry. He is not diaphoretic.  Psychiatric: He has a normal mood and affect. His behavior is normal.    BP 126/80   Pulse 72   Temp 98.3 F (36.8 C) (Oral)   Ht 6' 1.25" (1.861 m)   Wt 212 lb 8 oz (96.4 kg)   SpO2 99%   BMI 27.84 kg/m  Wt Readings from Last 3 Encounters:  05/21/18 212 lb 8 oz (96.4 kg)  01/17/18 196 lb 8 oz (89.1 kg)  11/07/17 200 lb 4 oz (90.8 kg)   BP Readings from Last 3 Encounters:  05/21/18 126/80  01/17/18 120/76  11/07/17 128/80   Guideline developer:  UpToDate (see UpToDate for funding source) Date Released: June 2014  Health Maintenance Due  Topic Date Due  . INFLUENZA VACCINE  12/28/2017    There are no preventive care reminders to display for this patient.  Lab Results  Component Value Date   TSH 1.41 10/28/2016   Lab Results  Component Value Date   WBC 6.7 02/07/2018   HGB 15.3 02/07/2018   HCT 44.6 02/07/2018   MCV 83.6 02/07/2018   PLT 182.0 02/07/2018   Lab Results  Component Value Date   NA 140 11/07/2017   K 4.0 11/07/2017   CO2 28 11/07/2017   GLUCOSE 101 (H) 11/07/2017   BUN 16 11/07/2017   CREATININE 0.86 11/07/2017   BILITOT 2.0 (H) 11/07/2017   ALKPHOS 76 11/07/2017   AST 17 11/07/2017   ALT 15 11/07/2017   PROT 6.9 11/07/2017   ALBUMIN 4.8 11/07/2017   CALCIUM 9.8 11/07/2017   ANIONGAP 14 06/21/2015   GFR 101.02 11/07/2017   Lab Results  Component Value Date   CHOL 224 (H) 11/03/2016   Lab Results  Component Value Date   HDL 55.20 11/03/2016   Lab Results  Component Value Date   LDLCALC 139 (H) 11/03/2016   Lab Results  Component Value Date   TRIG 150.0 (H) 11/03/2016   Lab Results  Component  Value Date   CHOLHDL 4 11/03/2016   Lab Results  Component Value Date   HGBA1C 5.2 10/28/2011      Assessment & Plan:   Problem List Items Addressed This Visit      Respiratory   Bronchitis - Primary   Relevant Medications   HYDROcodone-homatropine (HYCODAN) 5-1.5 MG/5ML syrup   azithromycin (ZITHROMAX) 250 MG tablet     Other   History of retinal detachment      Meds ordered this encounter  Medications  . HYDROcodone-homatropine (HYCODAN) 5-1.5 MG/5ML syrup    Sig: Take 5 mLs by mouth every 8 (eight) hours as needed for cough.    Dispense:  120 mL    Refill:  0  . azithromycin (ZITHROMAX) 250 MG tablet    Sig: Take 2 today and then 1 each day until finished.    Dispense:  6 tablet    Refill:  0    Follow-up: Return if symptoms worsen or fail to improve.

## 2018-06-05 DIAGNOSIS — H35353 Cystoid macular degeneration, bilateral: Secondary | ICD-10-CM | POA: Diagnosis not present

## 2018-06-05 DIAGNOSIS — H2000 Unspecified acute and subacute iridocyclitis: Secondary | ICD-10-CM | POA: Diagnosis not present

## 2018-06-13 DIAGNOSIS — H35353 Cystoid macular degeneration, bilateral: Secondary | ICD-10-CM | POA: Diagnosis not present

## 2018-06-14 DIAGNOSIS — H31093 Other chorioretinal scars, bilateral: Secondary | ICD-10-CM | POA: Diagnosis not present

## 2018-06-14 DIAGNOSIS — H3023 Posterior cyclitis, bilateral: Secondary | ICD-10-CM | POA: Diagnosis not present

## 2018-06-14 DIAGNOSIS — H3581 Retinal edema: Secondary | ICD-10-CM | POA: Diagnosis not present

## 2018-06-14 DIAGNOSIS — H35373 Puckering of macula, bilateral: Secondary | ICD-10-CM | POA: Diagnosis not present

## 2018-06-15 DIAGNOSIS — H3020 Posterior cyclitis, unspecified eye: Secondary | ICD-10-CM | POA: Diagnosis not present

## 2018-06-15 DIAGNOSIS — H44113 Panuveitis, bilateral: Secondary | ICD-10-CM | POA: Diagnosis not present

## 2018-06-28 DIAGNOSIS — H3581 Retinal edema: Secondary | ICD-10-CM | POA: Diagnosis not present

## 2018-07-10 DIAGNOSIS — H40053 Ocular hypertension, bilateral: Secondary | ICD-10-CM | POA: Diagnosis not present

## 2018-07-10 DIAGNOSIS — H35353 Cystoid macular degeneration, bilateral: Secondary | ICD-10-CM | POA: Diagnosis not present

## 2018-07-25 DIAGNOSIS — H35032 Hypertensive retinopathy, left eye: Secondary | ICD-10-CM | POA: Diagnosis not present

## 2018-07-25 DIAGNOSIS — H35373 Puckering of macula, bilateral: Secondary | ICD-10-CM | POA: Diagnosis not present

## 2018-07-25 DIAGNOSIS — H3023 Posterior cyclitis, bilateral: Secondary | ICD-10-CM | POA: Diagnosis not present

## 2018-07-25 DIAGNOSIS — H31093 Other chorioretinal scars, bilateral: Secondary | ICD-10-CM | POA: Diagnosis not present

## 2018-07-27 ENCOUNTER — Encounter: Payer: Self-pay | Admitting: Family Medicine

## 2018-07-27 ENCOUNTER — Ambulatory Visit: Payer: BLUE CROSS/BLUE SHIELD | Admitting: Family Medicine

## 2018-07-27 VITALS — BP 126/78 | HR 65 | Temp 98.9°F | Ht 73.25 in | Wt 206.2 lb

## 2018-07-27 DIAGNOSIS — R5383 Other fatigue: Secondary | ICD-10-CM | POA: Diagnosis not present

## 2018-07-27 DIAGNOSIS — E291 Testicular hypofunction: Secondary | ICD-10-CM | POA: Diagnosis not present

## 2018-07-27 DIAGNOSIS — M25662 Stiffness of left knee, not elsewhere classified: Secondary | ICD-10-CM | POA: Diagnosis not present

## 2018-07-27 DIAGNOSIS — M25672 Stiffness of left ankle, not elsewhere classified: Secondary | ICD-10-CM | POA: Diagnosis not present

## 2018-07-27 DIAGNOSIS — M25671 Stiffness of right ankle, not elsewhere classified: Secondary | ICD-10-CM

## 2018-07-27 DIAGNOSIS — M25661 Stiffness of right knee, not elsewhere classified: Secondary | ICD-10-CM | POA: Diagnosis not present

## 2018-07-27 HISTORY — DX: Stiffness of right knee, not elsewhere classified: M25.661

## 2018-07-27 HISTORY — DX: Stiffness of right ankle, not elsewhere classified: M25.671

## 2018-07-27 HISTORY — DX: Stiffness of right ankle, not elsewhere classified: M25.672

## 2018-07-27 HISTORY — DX: Stiffness of left knee, not elsewhere classified: M25.662

## 2018-07-27 LAB — TESTOSTERONE: Testosterone: 305.86 ng/dL (ref 300.00–890.00)

## 2018-07-27 LAB — COMPREHENSIVE METABOLIC PANEL
ALT: 18 U/L (ref 0–53)
AST: 21 U/L (ref 0–37)
Albumin: 4.7 g/dL (ref 3.5–5.2)
Alkaline Phosphatase: 62 U/L (ref 39–117)
BUN: 16 mg/dL (ref 6–23)
CO2: 27 mEq/L (ref 19–32)
Calcium: 9.6 mg/dL (ref 8.4–10.5)
Chloride: 103 mEq/L (ref 96–112)
Creatinine, Ser: 0.81 mg/dL (ref 0.40–1.50)
GFR: 101.54 mL/min (ref >60.00)
Glucose, Bld: 79 mg/dL (ref 70–99)
Potassium: 4.3 mEq/L (ref 3.5–5.1)
Sodium: 139 mEq/L (ref 135–145)
Total Bilirubin: 2.1 mg/dL — ABNORMAL HIGH (ref 0.2–1.2)
Total Protein: 6.8 g/dL (ref 6.0–8.3)

## 2018-07-27 LAB — CBC
HCT: 46.3 % (ref 39.0–52.0)
HEMOGLOBIN: 15.7 g/dL (ref 13.0–17.0)
MCHC: 33.8 g/dL (ref 30.0–36.0)
MCV: 89.3 fl (ref 78.0–100.0)
Platelets: 232 10*3/uL (ref 150.0–400.0)
RBC: 5.19 Mil/uL (ref 4.22–5.81)
RDW: 13 % (ref 11.5–15.5)
WBC: 5.5 10*3/uL (ref 4.0–10.5)

## 2018-07-27 LAB — SEDIMENTATION RATE: Sed Rate: 1 mm/hr (ref 0–15)

## 2018-07-27 LAB — C-REACTIVE PROTEIN: CRP: <1 mg/dL (ref 0.5–20.0)

## 2018-07-27 MED ORDER — TESTOSTERONE CYPIONATE 100 MG/ML IM SOLN: INTRAMUSCULAR | 5 refills | Status: DC

## 2018-07-27 NOTE — Progress Notes (Signed)
Established Patient Office Visit  Subjective:  Patient ID: Isaiah Cordova, male    DOB: 1970/12/25  Age: 48 y.o. MRN: [id number]  CC:  Chief Complaint  Patient presents with  . Rheumatoid Arthritis    He was told he had Rheumatoid Arthritis from labs completed by eye doctor. FYI: he ran out of testosterone about a month ago.    HPI Isaiah Cordova presents for follow-up of his androgen deficiency.  He has not had his testosterone injection in over a month now.  He takes it when he can but not necessarily every 2 weeks.  Ophthalmology drew blood work that indicated rheumatoid arthritis.  Patient does admit some morning stiffness in his knees and his ankles.  They are not swollen.  His hands wrists and elbows are not affected.  He does complain of fatigue.  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  He works long hours many days a week.  Past Medical History:  Diagnosis Date  . Anxiety   . Concussion   . Depression   . Gastric ulcer   . GERD (gastroesophageal reflux disease)   . Headache(784.0)    chronic  . Hemorrhoids, external   . Irritable bowel   . Neuromuscular disorder (HCC)    RLS  . Rectal bleeding    history of, negative colonoscopy 2005-2006    Past Surgical History:  Procedure Laterality Date  . COLONOSCOPY    . HERNIA REPAIR    . NASAL SEPTUM SURGERY    . RETINAL DETACHMENT SURGERY      Family History  Problem Relation Age of Onset  . Hypertension Father   . Diabetes Father   . Hyperlipidemia Father   . Prostate cancer Father   . Depression Maternal Grandfather        prostate  . Depression Paternal Grandfather        committed suicide  . Hypothyroidism Other   . Diabetes Mellitus II Brother     Social History   Socioeconomic History  . Marital status: Married    Spouse name: [name]  . Number of children: 1  . Years of education: Not on file  . Highest education level: Not on file  Occupational History  . 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IA    Employer: [employer]  Social Needs  . Financial resource strain: Not on file  . Food insecurity:    Worry: Not on file    Inability: Not on file  . Transportation needs:    Medical: Not on file    Non-medical: Not on file  Tobacco Use  . Smoking status: Never Smoker  . Smokeless tobacco: Never Used  Substance and Sexual Activity  . Alcohol use: Yes    Alcohol/week: 2.0 standard drinks    Types: 2 Cans of beer per week  . Drug use: Not on file  . Sexual activity: Not on file  Lifestyle  . Physical activity:    Days per week: Not on file    Minutes per session: Not on file  . Stress: Not on file  Relationships  . Social connections:    Talks on phone: Not on file    Gets together: Not on file    Attends religious service: Not on file    Active member of club or organization: Not on file    Attends meetings of clubs or organizations: Not on file    Relationship status: Not on file  .  Intimate partner violence:    Fear of current or ex partner: Not on file    Emotionally abused: Not on file    Physically abused: Not on file    Forced sexual activity: Not on file  Other Topics Concern  . Not on file  Social History Narrative   Regular exercise - yes   1 daughter 54 years old    Outpatient Medications Prior to Visit  Medication Sig Dispense Refill  . Cyanocobalamin (B-12 PO) Take by mouth. 1 tab daily    . latanoprost (XALATAN) 0.005 % ophthalmic solution INT 1 GTT INTO OU HS  4  . Multiple Vitamins-Minerals (ONE-A-DAY MENS HEALTH FORMULA PO) Take 1 tablet by mouth daily.    . Omega-3 Fatty Acids (FISH OIL) 1000 MG CAPS Take 1,000 mg by mouth daily.    . prednisoLONE acetate (PRED FORTE) 1 % ophthalmic suspension   0  . timolol (TIMOPTIC) 0.25 % ophthalmic solution Place 1 drop into the left eye daily.      Marland Kitchen azithromycin (ZITHROMAX) 250 MG tablet Take 2 today and then 1 each day until finished. 6 tablet 0  . HYDROcodone-homatropine (HYCODAN) 5-1.5  MG/5ML syrup Take 5 mLs by mouth every 8 (eight) hours as needed for cough. 120 mL 0  . loperamide (IMODIUM) 2 MG capsule Take 1 capsule (2 mg total) by mouth 2 (two) times daily as needed for diarrhea or loose stools. 30 capsule 2  . moxifloxacin (VIGAMOX) 0.5 % ophthalmic solution INT 1 GTT INTO OD QID STARTING 2 DAYS PRIOR TO SURGERY AND 2 GTS MORNING OF SURGERY  0  . testosterone cypionate (DEPOTESTOTERONE CYPIONATE) 100 MG/ML injection INJECT 1.5 ML INTRAMUSCULARLY EVERY 14 DAYS (Patient not taking: Reported on 07/27/2018) 10 mL 2  . travoprost, benzalkonium, (TRAVATAN) 0.004 % ophthalmic solution Place 1 drop into both eyes at bedtime.       No facility-administered medications prior to visit.     Allergies  Allergen Reactions  . Dust Mite Extract   . Lisdexamfetamine     REACTION: chest pains  . Other     GRASS  . Pollen Extract     ROS Review of Systems  Constitutional: Positive for fatigue. Negative for diaphoresis, fever and unexpected weight change.  HENT: Negative.   Eyes: Negative for photophobia and visual disturbance.  Respiratory: Negative.   Cardiovascular: Negative.   Gastrointestinal: Negative.   Endocrine: Negative for polyphagia and polyuria.  Genitourinary: Negative.   Musculoskeletal: Positive for arthralgias. Negative for myalgias.  Allergic/Immunologic: Negative for immunocompromised state.  Neurological: Negative for light-headedness and headaches.  Hematological: Does not bruise/bleed easily.  Psychiatric/Behavioral: Negative.       Objective:    Physical Exam  Constitutional: He is oriented to person, place, and time. He appears well-developed and well-nourished. No distress.  HENT:  Head: Normocephalic and atraumatic.  Right Ear: External ear normal.  Left Ear: External ear normal.  Mouth/Throat: Oropharynx is clear and moist. No oropharyngeal exudate.  Eyes: Pupils are equal, round, and reactive to light. Conjunctivae are normal. Right eye  exhibits no discharge. Left eye exhibits no discharge. No scleral icterus.  Neck: Neck supple. No JVD present. No tracheal deviation present. No thyromegaly present.  Cardiovascular: Normal rate, regular rhythm and normal heart sounds.  Pulmonary/Chest: Effort normal and breath sounds normal. No stridor.  Abdominal: Bowel sounds are normal. He exhibits no distension. There is no abdominal tenderness. There is no rebound and no guarding.  Musculoskeletal:  Right wrist: He exhibits normal range of motion, no tenderness and no bony tenderness.     Left wrist: He exhibits normal range of motion, no tenderness and no bony tenderness.     Right hand: He exhibits normal range of motion, no tenderness and no bony tenderness.     Left hand: He exhibits normal range of motion, no tenderness and no bony tenderness.  Lymphadenopathy:    He has no cervical adenopathy.  Neurological: He is alert and oriented to person, place, and time.  Skin: Skin is warm and dry. He is not diaphoretic.  Psychiatric: He has a normal mood and affect. His behavior is normal.    BP 126/78 (BP Location: Right Arm, Patient Position: Sitting, Cuff Size: Normal)   Pulse 65   Temp 98.9 F (37.2 C) (Oral)   Ht 6' 1.25" (1.861 m)   Wt 206 lb 3.2 oz (93.5 kg)   SpO2 98%   BMI 27.02 kg/m  Wt Readings from Last 3 Encounters:  07/27/18 206 lb 3.2 oz (93.5 kg)  05/21/18 212 lb 8 oz (96.4 kg)  01/17/18 196 lb 8 oz (89.1 kg)     There are no preventive care reminders to display for this patient.  There are no preventive care reminders to display for this patient.  Lab Results  Component Value Date   TSH 1.41 10/28/2016   Lab Results  Component Value Date   WBC 6.7 02/07/2018   HGB 15.3 02/07/2018   HCT 44.6 02/07/2018   MCV 83.6 02/07/2018   PLT 182.0 02/07/2018   Lab Results  Component Value Date   NA 140 11/07/2017   K 4.0 11/07/2017   CO2 28 11/07/2017   GLUCOSE 101 (H) 11/07/2017   BUN 16 11/07/2017    CREATININE 0.86 11/07/2017   BILITOT 2.0 (H) 11/07/2017   ALKPHOS 76 11/07/2017   AST 17 11/07/2017   ALT 15 11/07/2017   PROT 6.9 11/07/2017   ALBUMIN 4.8 11/07/2017   CALCIUM 9.8 11/07/2017   ANIONGAP 14 06/21/2015   GFR 101.02 11/07/2017   Lab Results  Component Value Date   CHOL 224 (H) 11/03/2016   Lab Results  Component Value Date   HDL 55.20 11/03/2016   Lab Results  Component Value Date   LDLCALC 139 (H) 11/03/2016   Lab Results  Component Value Date   TRIG 150.0 (H) 11/03/2016   Lab Results  Component Value Date   CHOLHDL 4 11/03/2016   Lab Results  Component Value Date   HGBA1C 5.2 10/28/2011      Assessment & Plan:   Problem List Items Addressed This Visit      Endocrine   Androgen deficiency - Primary   Relevant Medications   testosterone cypionate (DEPOTESTOTERONE CYPIONATE) 100 MG/ML injection   Other Relevant Orders   Comprehensive metabolic panel   Testosterone     Other   Fatigue   Joint stiffness of both knees   Relevant Orders   CBC   Rheumatoid Factor   Cyclic citrul peptide antibody, IgG (QUEST)   Sedimentation rate   C-reactive protein   Joint stiffness of both ankles   Relevant Orders   CBC   Rheumatoid Factor   Cyclic citrul peptide antibody, IgG (QUEST)   Sedimentation rate   C-reactive protein      Meds ordered this encounter  Medications  . testosterone cypionate (DEPOTESTOTERONE CYPIONATE) 100 MG/ML injection    Sig: INJECT 1.5 ML INTRAMUSCULARLY EVERY 14 DAYS  Dispense:  10 mL    Refill:  5    Follow-up: Return in about 6 months (around 01/25/2019), or needs physical exam.    Libby Maw, MD

## 2018-07-27 NOTE — Patient Instructions (Signed)
Rheumatoid Arthritis  Rheumatoid arthritis (RA) is a long-term (chronic) disease that causes inflammation in your joints. RA may start slowly. It usually affects the small joints of the hands and feet. Usually, the same joints are affected on both sides of your body. Inflammation from RA can also affect other parts of your body, including your heart, eyes, or lungs.  RA is an autoimmune disease. That means that your body's defense system (immune system) mistakenly attacks healthy body tissues. There is no cure for RA, but medicines can help your symptoms and halt or slow down the progression of the disease.  What are the causes?  The exact cause of RA is not known.  What increases the risk?  This condition is more likely to develop in:   Women.   People who have a family history of RA or other autoimmune diseases.  What are the signs or symptoms?  Symptoms of this condition vary from person to person. Symptoms usually start gradually. They are often worse in the morning. The first symptom may be morning stiffness that lasts longer than 30 minutes.  As RA progresses, symptoms may include:   Pain, stiffness, swelling, warmth, and tenderness in joints on both sides of your body.   Loss of energy.   Loss of appetite.   Weight loss.   Low-grade fever.   Dry eyes and dry mouth.   Firm lumps (rheumatoid nodules) that grow beneath your skin in areas such as your forearm bones near your elbows and on your hands.   Changes in the appearance of joints (deformity) and loss of joint function.  Symptoms of RA often come and go. Sometimes, symptoms get worse for a period of time. These are called flares.  How is this diagnosed?  This condition is diagnosed based on your symptoms, medical history, and physical exam. You may have X-rays or MRI to check for the type of joint changes that are caused by RA. You may also have blood tests to look for:   Proteins (antibodies) that your immune system may make if you have RA.  They include rheumatoid factor (RF) and anti-CCP.  ? When blood tests show these proteins, you are said to have "seropositive RA."  ? When blood tests do not show these proteins, you may have "seronegative RA."   Inflammation in your blood.   A low number of red blood cells (anemia).  How is this treated?  The goals of treatment are to relieve pain, reduce inflammation, and slow down or stop joint damage and disability. Treatment may include:   Lifestyle changes. It is important to rest, eat a healthy diet, and exercise.   Medicines. Your health care provider may adjust your medicines every 3 months until treatment goals are reached. Common medicines include:  ? Pain relievers (analgesics).  ? Corticosteroids and NSAIDs to reduce inflammation.  ? Disease-modifying antirheumatic drugs (DMARDs) to try to slow the course of the disease.  ? Biologic response modifiers to reduce inflammation and damage.   Physical therapy and occupational therapy.   Surgery, if you have severe joint damage. Joint replacement or fusing of joints may be needed.  Your health care provider will work with you to identify the best treatment option for you based on assessment of the overall disease activity in your body.  Follow these instructions at home:   Take over-the-counter and prescription medicines only as told by your health care provider.   Start an exercise program as told by your health   care provider.   Rest when you are having a flare.   Return to your normal activities as told by your health care provider. Ask your health care provider what activities are safe for you.   Keep all follow-up visits as told by your health care provider. This is important.  Where to find more information   American College of Rheumatology: www.rheumatology.org   Arthritis Foundation: www.arthritis.org  Contact a health care provider if:   You have a flare-up of RA symptoms.   You have a fever.   You have side effects from your  medicines.  Get help right away if:   You have chest pain.   You have trouble breathing.   You quickly develop a hot, painful joint that is more severe than your usual joint aches.  This information is not intended to replace advice given to you by your health care provider. Make sure you discuss any questions you have with your health care provider.  Document Released: 05/13/2000 Document Revised: 10/27/2016 Document Reviewed: 02/26/2015  Elsevier Interactive Patient Education  2019 Elsevier Inc.

## 2018-07-30 LAB — RHEUMATOID FACTOR: Rheumatoid fact SerPl-aCnc: <14 IU/mL (ref <14)

## 2018-07-30 LAB — CYCLIC CITRUL PEPTIDE ANTIBODY, IGG: Cyclic Citrullin Peptide Ab: <16 UNITS

## 2018-08-03 DIAGNOSIS — M79671 Pain in right foot: Secondary | ICD-10-CM | POA: Diagnosis not present

## 2018-08-03 DIAGNOSIS — G5761 Lesion of plantar nerve, right lower limb: Secondary | ICD-10-CM | POA: Diagnosis not present

## 2018-09-25 DIAGNOSIS — H35032 Hypertensive retinopathy, left eye: Secondary | ICD-10-CM | POA: Diagnosis not present

## 2018-09-25 DIAGNOSIS — H3023 Posterior cyclitis, bilateral: Secondary | ICD-10-CM | POA: Diagnosis not present

## 2018-09-25 DIAGNOSIS — H31093 Other chorioretinal scars, bilateral: Secondary | ICD-10-CM | POA: Diagnosis not present

## 2018-09-25 DIAGNOSIS — H35373 Puckering of macula, bilateral: Secondary | ICD-10-CM | POA: Diagnosis not present

## 2018-11-02 DIAGNOSIS — H40053 Ocular hypertension, bilateral: Secondary | ICD-10-CM | POA: Diagnosis not present

## 2018-11-25 ENCOUNTER — Other Ambulatory Visit: Payer: Self-pay | Admitting: Family Medicine

## 2018-11-25 DIAGNOSIS — E291 Testicular hypofunction: Secondary | ICD-10-CM

## 2018-11-28 ENCOUNTER — Ambulatory Visit (INDEPENDENT_AMBULATORY_CARE_PROVIDER_SITE_OTHER): Payer: BC Managed Care – PPO | Admitting: Family Medicine

## 2018-11-28 ENCOUNTER — Telehealth: Payer: Self-pay | Admitting: *Deleted

## 2018-11-28 ENCOUNTER — Encounter: Payer: Self-pay | Admitting: Family Medicine

## 2018-11-28 ENCOUNTER — Telehealth: Payer: Self-pay | Admitting: Family Medicine

## 2018-11-28 DIAGNOSIS — B349 Viral infection, unspecified: Secondary | ICD-10-CM | POA: Diagnosis not present

## 2018-11-28 DIAGNOSIS — Z20822 Contact with and (suspected) exposure to covid-19: Secondary | ICD-10-CM

## 2018-11-28 NOTE — Telephone Encounter (Signed)
K

## 2018-11-28 NOTE — Progress Notes (Signed)
Established Patient Office Visit  Subjective:  Patient ID: Isaiah Cordova, male    DOB: [birth year]  Age: 48 y.o. MRN: [id number]  CC:  Chief Complaint  Patient presents with  . Sore Throat  . dry cough    HPI Isaiah Cordova presents for evaluation of a 2 to 3-day history of malaise and fatigue sore throat and cough with some shortness of breath.  He is also had some myalgias.  Past medical history of bronchitis in the past.  He has had no fever nausea or changes in his taste or smell.  He has been isolating at home for the most part.  He exercises twice daily and denies any exertional chest pain or shortness of breath.  He does not smoke.  He has been at home for the duration of the COVID pandemic.  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  This is caused him a lot of angst.  He is been sad and depressed about this.  He has noted mood swings and is often seen after his wife.  Past Medical History:  Diagnosis Date  . Anxiety   . Concussion   . Depression   . Gastric ulcer   . GERD (gastroesophageal reflux disease)   . Headache(784.0)    chronic  . Hemorrhoids, external   . Irritable bowel   . Neuromuscular disorder (HCC)    RLS  . Rectal bleeding    history of, negative colonoscopy 2005-2006    Past Surgical History:  Procedure Laterality Date  . COLONOSCOPY    . HERNIA REPAIR    . NASAL SEPTUM SURGERY    . RETINAL DETACHMENT SURGERY      Family History  Problem Relation Age of Onset  . Hypertension Father   . Diabetes Father   . Hyperlipidemia Father   . Prostate cancer Father   . Depression Maternal Grandfather        prostate  . Depression Paternal Grandfather        committed suicide  . Hypothyroidism Other   . Diabetes Mellitus II Brother     Social History   Socioeconomic History  . Marital status: Married    Spouse name: [name]  . Number of children: 1  . Years of education: Not on file  . Highest  education level: Not on file  Occupational History  . 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    Employer: [employer]  Social Needs  . Financial resource strain: Not on file  . Food insecurity    Worry: Not on file    Inability: Not on file  . Transportation needs    Medical: Not on file    Non-medical: Not on file  Tobacco Use  . Smoking status: Never Smoker  . Smokeless tobacco: Never Used  Substance and Sexual Activity  . Alcohol use: Yes    Alcohol/week: 2.0 standard drinks    Types: 2 Cans of beer per week  . Drug use: Not on file  . Sexual activity: Not on file  Lifestyle  . Physical activity    Days per week: Not on file    Minutes per session: Not on file  . Stress: Not on file  Relationships  . Social Herbalist on phone: Not on file    Gets together: Not on file    Attends religious service: Not on file    Active  member of club or organization: Not on file    Attends meetings of clubs or organizations: Not on file    Relationship status: Not on file  . Intimate partner violence    Fear of current or ex partner: Not on file    Emotionally abused: Not on file    Physically abused: Not on file    Forced sexual activity: Not on file  Other Topics Concern  . Not on file  Social History Narrative   Regular exercise - yes   1 daughter 66 years old    Outpatient Medications Prior to Visit  Medication Sig Dispense Refill  . Cyanocobalamin (B-12 PO) Take by mouth. 1 tab daily    . latanoprost (XALATAN) 0.005 % ophthalmic solution INT 1 GTT INTO OU HS  4  . Multiple Vitamins-Minerals (ONE-A-DAY MENS HEALTH FORMULA PO) Take 1 tablet by mouth daily.    . Omega-3 Fatty Acids (FISH OIL) 1000 MG CAPS Take 1,000 mg by mouth daily.    . prednisoLONE acetate (PRED FORTE) 1 % ophthalmic suspension   0  . testosterone cypionate (DEPOTESTOTERONE CYPIONATE) 100 MG/ML injection INJECT 1.5 ML IN THE MUSCLE EVERY 14 DAYS 10 mL 2  . timolol (TIMOPTIC) 0.25 % ophthalmic  solution Place 1 drop into the left eye daily.       No facility-administered medications prior to visit.     Allergies  Allergen Reactions  . Dust Mite Extract   . Lisdexamfetamine     REACTION: chest pains  . Other     GRASS  . Pollen Extract     ROS Review of Systems  Constitutional: Negative for chills, diaphoresis, fatigue, fever and unexpected weight change.  HENT: Positive for postnasal drip and sore throat. Negative for congestion, rhinorrhea, sinus pressure, sinus pain and sneezing.   Respiratory: Positive for cough and shortness of breath. Negative for chest tightness and wheezing.   Cardiovascular: Negative for chest pain and palpitations.  Gastrointestinal: Negative for nausea and vomiting.  Genitourinary: Negative.   Musculoskeletal: Negative for arthralgias.  Skin: Negative for pallor and rash.  Allergic/Immunologic: Negative for immunocompromised state.  Neurological: Positive for headaches.  Hematological: Does not bruise/bleed easily.  Psychiatric/Behavioral: Positive for dysphoric mood. The patient is nervous/anxious.       Objective:    Physical Exam  Constitutional: He is oriented to person, place, and time. He appears well-developed and well-nourished. No distress.  HENT:  Head: Normocephalic and atraumatic.  Right Ear: External ear normal.  Left Ear: External ear normal.  Eyes: Right eye exhibits no discharge. Left eye exhibits no discharge. No scleral icterus.  Neck: No JVD present. No tracheal deviation present.  Pulmonary/Chest: Effort normal. No stridor.  Neurological: He is alert and oriented to person, place, and time.  Skin: Skin is warm and dry. He is not diaphoretic.  Psychiatric: He has a normal mood and affect. His behavior is normal.    There were no vitals taken for this visit. Wt Readings from Last 3 Encounters:  07/27/18 206 lb 3.2 oz (93.5 kg)  05/21/18 212 lb 8 oz (96.4 kg)  01/17/18 196 lb 8 oz (89.1 kg)   BP Readings from  Last 3 Encounters:  07/27/18 126/78  05/21/18 126/80  01/17/18 120/76   Guideline developer:  UpToDate (see UpToDate for funding source) Date Released: June 2014  There are no preventive care reminders to display for this patient.  There are no preventive care reminders to display for this patient.  Lab  Results  Component Value Date   TSH 1.41 10/28/2016   Lab Results  Component Value Date   WBC 5.5 07/27/2018   HGB 15.7 07/27/2018   HCT 46.3 07/27/2018   MCV 89.3 07/27/2018   PLT 232.0 07/27/2018   Lab Results  Component Value Date   NA 139 07/27/2018   K 4.3 07/27/2018   CO2 27 07/27/2018   GLUCOSE 79 07/27/2018   BUN 16 07/27/2018   CREATININE 0.81 07/27/2018   BILITOT 2.1 (H) 07/27/2018   ALKPHOS 62 07/27/2018   AST 21 07/27/2018   ALT 18 07/27/2018   PROT 6.8 07/27/2018   ALBUMIN 4.7 07/27/2018   CALCIUM 9.6 07/27/2018   ANIONGAP 14 06/21/2015   GFR 101.54 07/27/2018   Lab Results  Component Value Date   CHOL 224 (H) 11/03/2016   Lab Results  Component Value Date   HDL 55.20 11/03/2016   Lab Results  Component Value Date   LDLCALC 139 (H) 11/03/2016   Lab Results  Component Value Date   TRIG 150.0 (H) 11/03/2016   Lab Results  Component Value Date   CHOLHDL 4 11/03/2016   Lab Results  Component Value Date   HGBA1C 5.2 10/28/2011      Assessment & Plan:   Problem List Items Addressed This Visit    None    Visit Diagnoses    Acute viral syndrome    -  Primary      No orders of the defined types were placed in this encounter.   Follow-up: Return With negative Covid testing will fu with me in a week for mood disorder evaluation..   Patient will isolated home pending results of his cognitive testing.

## 2018-11-28 NOTE — Telephone Encounter (Signed)
Requesting [phone] testing. Having chest pain, at the sternum stating he has this often with reflux. Rates it at 2. Started 2 days ago he reports. Fatigued with body soreness. Occasional dry cough and constant ST. Drinking plenty of water daily. Lightheaded periodically throughout the day for about one week (no other details he can provide). No fever. Feels stressed due to lack of a job. Reviewed activity level. Goes outside daily to ride his bike. His tone Sounds depressed. On testosterone injections every 2 weeks. No travels/no known contacts. Reviewed sxs he should phone for. Stated he understood.

## 2018-11-28 NOTE — Telephone Encounter (Signed)
FYI, he is being seen by you today.

## 2018-11-28 NOTE — Telephone Encounter (Signed)
Pt referred for covid-19 testing by Dr Ethelene Hal.  Pt scheduled for tomorrow at 8:30 am at the [facility]. Address given to patient. Advised that this is a drive thru test site,so stay in car with windows rolled up and mask on until time for testing. Pt voiced understanding.

## 2018-11-29 ENCOUNTER — Other Ambulatory Visit: Payer: BC Managed Care – PPO

## 2018-11-29 DIAGNOSIS — R6889 Other general symptoms and signs: Secondary | ICD-10-CM | POA: Diagnosis not present

## 2018-11-29 DIAGNOSIS — Z20822 Contact with and (suspected) exposure to covid-19: Secondary | ICD-10-CM

## 2018-12-02 LAB — NOVEL CORONAVIRUS, NAA: SARS-CoV-2, NAA: NOT DETECTED

## 2018-12-03 ENCOUNTER — Ambulatory Visit: Payer: BC Managed Care – PPO | Admitting: Family Medicine

## 2018-12-03 ENCOUNTER — Encounter: Payer: Self-pay | Admitting: Family Medicine

## 2018-12-03 VITALS — BP 126/80 | HR 67 | Temp 98.0°F | Ht 73.25 in | Wt 207.1 lb

## 2018-12-03 DIAGNOSIS — F418 Other specified anxiety disorders: Secondary | ICD-10-CM

## 2018-12-03 MED ORDER — PAROXETINE HCL 10 MG PO TABS: ORAL_TABLET | ORAL | 1 refills | Status: DC

## 2018-12-03 NOTE — Patient Instructions (Signed)
Living With Depression Everyone experiences occasional disappointment, sadness, and loss in their lives. When you are feeling down, blue, or sad for at least 2 weeks in a row, it may mean that you have depression. Depression can affect your thoughts and feelings, relationships, daily activities, and physical health. It is caused by changes in the way your brain functions. If you receive a diagnosis of depression, your health care provider will tell you which type of depression you have and what treatment options are available to you. If you are living with depression, there are ways to help you recover from it and also ways to prevent it from coming back. How to cope with lifestyle changes Coping with stress     Stress is your body's reaction to life changes and events, both good and bad. Stressful situations may include:  Getting married.  The death of a spouse.  Losing a job.  Retiring.  Having a baby. Stress can last just a few hours or it can be ongoing. Stress can play a major role in depression, so it is important to learn both how to cope with stress and how to think about it differently. Talk with your health care provider or a counselor if you would like to learn more about stress reduction. He or she may suggest some stress reduction techniques, such as:  Music therapy. This can include creating music or listening to music. Choose music that you enjoy and that inspires you.  Mindfulness-based meditation. This kind of meditation can be done while sitting or walking. It involves being aware of your normal breaths, rather than trying to control your breathing.  Centering prayer. This is a kind of meditation that involves focusing on a spiritual word or phrase. Choose a word, phrase, or sacred image that is meaningful to you and that brings you peace.  Deep breathing. To do this, expand your stomach and inhale slowly through your nose. Hold your breath for 3-5 seconds, then exhale  slowly, allowing your stomach muscles to relax.  Muscle relaxation. This involves intentionally tensing muscles then relaxing them. Choose a stress reduction technique that fits your lifestyle and personality. Stress reduction techniques take time and practice to develop. Set aside 5-15 minutes a day to do them. Therapists can offer training in these techniques. The training may be covered by some insurance plans. Other things you can do to manage stress include:  Keeping a stress diary. This can help you learn what triggers your stress and ways to control your response.  Understanding what your limits are and saying no to requests or events that lead to a schedule that is too full.  Thinking about how you respond to certain situations. You may not be able to control everything, but you can control how you react.  Adding humor to your life by watching funny films or TV shows.  Making time for activities that help you relax and not feeling guilty about spending your time this way.  Medicines Your health care provider may suggest certain medicines if he or she feels that they will help improve your condition. Avoid using alcohol and other substances that may prevent your medicines from working properly (may interact). It is also important to:  Talk with your pharmacist or health care provider about all the medicines that you take, their possible side effects, and what medicines are safe to take together.  Make it your goal to take part in all treatment decisions (shared decision-making). This includes giving input on   the side effects of medicines. It is best if shared decision-making with your health care provider is part of your total treatment plan. If your health care provider prescribes a medicine, you may not notice the full benefits of it for 4-8 weeks. Most people who are treated for depression need to be on medicine for at least 6-12 months after they feel better. If you are taking  medicines as part of your treatment, do not stop taking medicines without first talking to your health care provider. You may need to have the medicine slowly decreased (tapered) over time to decrease the risk of harmful side effects. Relationships Your health care provider may suggest family therapy along with individual therapy and drug therapy. While there may not be family problems that are causing you to feel depressed, it is still important to make sure your family learns as much as they can about your mental health. Having your family's support can help make your treatment successful. How to recognize changes in your condition Everyone has a different response to treatment for depression. Recovery from major depression happens when you have not had signs of major depression for two months. This may mean that you will start to:  Have more interest in doing activities.  Feel less hopeless than you did 2 months ago.  Have more energy.  Overeat less often, or have better or improving appetite.  Have better concentration. Your health care provider will work with you to decide the next steps in your recovery. It is also important to recognize when your condition is getting worse. Watch for these signs:  Having fatigue or low energy.  Eating too much or too little.  Sleeping too much or too little.  Feeling restless, agitated, or hopeless.  Having trouble concentrating or making decisions.  Having unexplained physical complaints.  Feeling irritable, angry, or aggressive. Get help as soon as you or your family members notice these symptoms coming back. How to get support and help from others How to talk with friends and family members about your condition  Talking to friends and family members about your condition can provide you with one way to get support and guidance. Reach out to trusted friends or family members, explain your symptoms to them, and let them know that you are  working with a health care provider to treat your depression. Financial resources Not all insurance plans cover mental health care, so it is important to check with your insurance carrier. If paying for co-pays or counseling services is a problem, search for a local or county mental health care center. They may be able to offer public mental health care services at low or no cost when you are not able to see a private health care provider. If you are taking medicine for depression, you may be able to get the generic form, which may be less expensive. Some makers of prescription medicines also offer help to patients who cannot afford the medicines they need. Follow these instructions at home:   Get the right amount and quality of sleep.  Cut down on using caffeine, tobacco, alcohol, and other potentially harmful substances.  Try to exercise, such as walking or lifting small weights.  Take over-the-counter and prescription medicines only as told by your health care provider.  Eat a healthy diet that includes plenty of vegetables, fruits, whole grains, low-fat dairy products, and lean protein. Do not eat a lot of foods that are high in solid fats, added sugars, or salt.    Keep all follow-up visits as told by your health care provider. This is important. Contact a health care provider if:  You stop taking your antidepressant medicines, and you have any of these symptoms: ? Nausea. ? Headache. ? Feeling lightheaded. ? Chills and body aches. ? Not being able to sleep (insomnia).  You or your friends and family think your depression is getting worse. Get help right away if:  You have thoughts of hurting yourself or others. If you ever feel like you may hurt yourself or others, or have thoughts about taking your own life, get help right away. You can go to your nearest emergency department or call:  Your local emergency services (911 in the [place]  A suicide crisis helpline, such as the  [place] at (559)051-4355. This is open 24-hours a day. Summary  If you are living with depression, there are ways to help you recover from it and also ways to prevent it from coming back.  Work with your health care team to create a management plan that includes counseling, stress management techniques, and healthy lifestyle habits. This information is not intended to replace advice given to you by your health care provider. Make sure you discuss any questions you have with your health care provider. Document Released: 04/18/2016 Document Revised: 09/07/2018 Document Reviewed: 04/18/2016 Elsevier Patient Education  [place].  Mindfulness-Based Stress Reduction Mindfulness-based stress reduction (MBSR) is a program that helps people learn to practice mindfulness. Mindfulness is the practice of intentionally paying attention to the present moment. It can be learned and practiced through techniques such as education, breathing exercises, meditation, and yoga. MBSR includes several mindfulness techniques in one program. MBSR works best when you understand the treatment, are willing to try new things, and can commit to spending time practicing what you learn. MBSR training may include learning about:  How your emotions, thoughts, and reactions affect your body.  New ways to respond to things that cause negative thoughts to start (triggers).  How to notice your thoughts and let go of them.  Practicing awareness of everyday things that you normally do without thinking.  The techniques and goals of different types of meditation. What are the benefits of MBSR? MBSR can have many benefits, which include helping you to:  Develop self-awareness. This refers to knowing and understanding yourself.  Learn skills and attitudes that help you to participate in your own health care.  Learn new ways to care for yourself.  Be more accepting about how things are, and  let things go.  Be less judgmental and approach things with an open mind.  Be patient with yourself and trust yourself more. MBSR has also been shown to:  Reduce negative emotions, such as depression and anxiety.  Improve memory and focus.  Change how you sense and approach pain.  Boost your body's ability to fight infections.  Help you connect better with other people.  Improve your sense of well-being. Follow these instructions at home:   Find a local in-person or online MBSR program.  Set aside some time regularly for mindfulness practice.  Find a mindfulness practice that works best for you. This may include one or more of the following: ? Meditation. Meditation involves focusing your mind on a certain thought or activity. ? Breathing awareness exercises. These help you to stay present by focusing on your breath. ? Body scan. For this practice, you lie down and pay attention to each part of your body from head  to toe. You can identify tension and soreness and intentionally relax parts of your body. ? Yoga. Yoga involves stretching and breathing, and it can improve your ability to move and be flexible. It can also provide an experience of testing your body's limits, which can help you release stress. ? Mindful eating. This way of eating involves focusing on the taste, texture, color, and smell of each bite of food. Because this slows down eating and helps you feel full sooner, it can be an important part of a weight-loss plan.  Find a podcast or recording that provides guidance for breathing awareness, body scan, or meditation exercises. You can listen to these any time when you have a free moment to rest without distractions.  Follow your treatment plan as told by your health care provider. This may include taking regular medicines and making changes to your diet or lifestyle as recommended. How to practice mindfulness To do a basic awareness exercise:  Find a comfortable  place to sit.  Pay attention to the present moment. Observe your thoughts, feelings, and surroundings just as they are.  Avoid placing judgment on yourself, your feelings, or your surroundings. Make note of any judgment that comes up, and let it go.  Your mind may wander, and that is okay. Make note of when your thoughts drift, and return your attention to the present moment. To do basic mindfulness meditation:  Find a comfortable place to sit. This may include a stable chair or a firm floor cushion. ? Sit upright with your back straight. Let your arms fall next to your side with your hands resting on your legs. ? If sitting in a chair, rest your feet flat on the floor. ? If sitting on a cushion, cross your legs in front of you.  Keep your head in a neutral position with your chin dropped slightly. Relax your jaw and rest the tip of your tongue on the roof of your mouth. Drop your gaze to the floor. You can close your eyes if you like.  Breathe normally and pay attention to your breath. Feel the air moving in and out of your nose. Feel your belly expanding and relaxing with each breath.  Your mind may wander, and that is okay. Make note of when your thoughts drift, and return your attention to your breath.  Avoid placing judgment on yourself, your feelings, or your surroundings. Make note of any judgment or feelings that come up, let them go, and bring your attention back to your breath.  When you are ready, lift your gaze or open your eyes. Pay attention to how your body feels after the meditation. Where to find more information You can find more information about MBSR from:  Your health care provider.  Community-based meditation centers or programs.  Programs offered near you. Summary  Mindfulness-based stress reduction (MBSR) is a program that teaches you how to intentionally pay attention to the present moment. It is used with other treatments to help you cope better with daily  stress, emotions, and pain.  MBSR focuses on developing self-awareness, which allows you to respond to life stress without judgment or negative emotions.  MBSR programs may involve learning different mindfulness practices, such as breathing exercises, meditation, yoga, body scan, or mindful eating. Find a mindfulness practice that works best for you, and set aside time for it on a regular basis. This information is not intended to replace advice given to you by your health care provider. Make sure you  discuss any questions you have with your health care provider. Document Released: 09/22/2016 Document Revised: 04/28/2017 Document Reviewed: 09/22/2016 Elsevier Patient Education  2020 [employer].

## 2018-12-03 NOTE — Progress Notes (Deleted)
Established Patient Office Visit  Subjective:  Patient ID: Isaiah Cordova, male    DOB: [birth year]  Age: 48 y.o. MRN: [id number]  CC:  Chief Complaint  Patient presents with  . Follow-up    HPI Isaiah Cordova presents for ***  Past Medical History:  Diagnosis Date  . Anxiety   . Concussion   . Depression   . Gastric ulcer   . GERD (gastroesophageal reflux disease)   . Headache(784.0)    chronic  . Hemorrhoids, external   . Irritable bowel   . Neuromuscular disorder (HCC)    RLS  . Rectal bleeding    history of, negative colonoscopy 2005-2006    Past Surgical History:  Procedure Laterality Date  . COLONOSCOPY    . HERNIA REPAIR    . NASAL SEPTUM SURGERY    . RETINAL DETACHMENT SURGERY      Family History  Problem Relation Age of Onset  . Hypertension Father   . Diabetes Father   . Hyperlipidemia Father   . Prostate cancer Father   . Depression Maternal Grandfather        prostate  . Depression Paternal Grandfather        committed suicide  . Hypothyroidism Other   . Diabetes Mellitus II Brother     Social History   Socioeconomic History  . Marital status: Married    Spouse name: [name]  . Number of children: 1  . Years of education: Not on file  . Highest education level: Not on file  Occupational History  . 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    Employer: [employer]  Social Needs  . Financial resource strain: Not on file  . Food insecurity    Worry: Not on file    Inability: Not on file  . Transportation needs    Medical: Not on file    Non-medical: Not on file  Tobacco Use  . Smoking status: Never Smoker  . Smokeless tobacco: Never Used  Substance and Sexual Activity  . Alcohol use: Yes    Alcohol/week: 2.0 standard drinks    Types: 2 Cans of beer per week  . Drug use: Not on file  . Sexual activity: Not on file  Lifestyle  . Physical activity    Days per week: Not on file    Minutes per session: Not on file  . Stress:  Not on file  Relationships  . Social Herbalist on phone: Not on file    Gets together: Not on file    Attends religious service: Not on file    Active member of club or organization: Not on file    Attends meetings of clubs or organizations: Not on file    Relationship status: Not on file  . Intimate partner violence    Fear of current or ex partner: Not on file    Emotionally abused: Not on file    Physically abused: Not on file    Forced sexual activity: Not on file  Other Topics Concern  . Not on file  Social History Narrative   Regular exercise - yes   1 daughter 59 years old    Outpatient Medications Prior to Visit  Medication Sig Dispense Refill  . Cyanocobalamin (B-12 PO) Take by mouth. 1 tab daily    . latanoprost (XALATAN) 0.005 % ophthalmic solution INT 1 GTT INTO OU HS  4  . Multiple Vitamins-Minerals (ONE-A-DAY MENS HEALTH FORMULA PO) Take 1  tablet by mouth daily.    . Omega-3 Fatty Acids (FISH OIL) 1000 MG CAPS Take 1,000 mg by mouth daily.    . prednisoLONE acetate (PRED FORTE) 1 % ophthalmic suspension   0  . testosterone cypionate (DEPOTESTOTERONE CYPIONATE) 100 MG/ML injection INJECT 1.5 ML IN THE MUSCLE EVERY 14 DAYS 10 mL 2  . timolol (TIMOPTIC) 0.25 % ophthalmic solution Place 1 drop into the left eye daily.       No facility-administered medications prior to visit.     Allergies  Allergen Reactions  . Dust Mite Extract   . Lisdexamfetamine     REACTION: chest pains  . Other     GRASS  . Pollen Extract     ROS Review of Systems    Objective:    Physical Exam  BP 126/80   Pulse 67   Temp 98 F (36.7 C) (Oral)   Ht 6' 1.25" (1.861 m)   Wt 207 lb 2 oz (94 kg)   SpO2 97%   BMI 27.14 kg/m  Wt Readings from Last 3 Encounters:  12/03/18 207 lb 2 oz (94 kg)  07/27/18 206 lb 3.2 oz (93.5 kg)  05/21/18 212 lb 8 oz (96.4 kg)     There are no preventive care reminders to display for this patient.  There are no preventive care  reminders to display for this patient.  Lab Results  Component Value Date   TSH 1.41 10/28/2016   Lab Results  Component Value Date   WBC 5.5 07/27/2018   HGB 15.7 07/27/2018   HCT 46.3 07/27/2018   MCV 89.3 07/27/2018   PLT 232.0 07/27/2018   Lab Results  Component Value Date   NA 139 07/27/2018   K 4.3 07/27/2018   CO2 27 07/27/2018   GLUCOSE 79 07/27/2018   BUN 16 07/27/2018   CREATININE 0.81 07/27/2018   BILITOT 2.1 (H) 07/27/2018   ALKPHOS 62 07/27/2018   AST 21 07/27/2018   ALT 18 07/27/2018   PROT 6.8 07/27/2018   ALBUMIN 4.7 07/27/2018   CALCIUM 9.6 07/27/2018   ANIONGAP 14 06/21/2015   GFR 101.54 07/27/2018   Lab Results  Component Value Date   CHOL 224 (H) 11/03/2016   Lab Results  Component Value Date   HDL 55.20 11/03/2016   Lab Results  Component Value Date   LDLCALC 139 (H) 11/03/2016   Lab Results  Component Value Date   TRIG 150.0 (H) 11/03/2016   Lab Results  Component Value Date   CHOLHDL 4 11/03/2016   Lab Results  Component Value Date   HGBA1C 5.2 10/28/2011      Assessment & Plan:   Problem List Items Addressed This Visit    None    Visit Diagnoses    Depression with anxiety    -  Primary   Relevant Medications   PARoxetine (PAXIL) 10 MG tablet      Meds ordered this encounter  Medications  . PARoxetine (PAXIL) 10 MG tablet    Sig: Take one daily for one week and then increase to 2 daily.    Dispense:  60 tablet    Refill:  1    Follow-up: Return in about 1 month (around 01/03/2019).    Libby Maw, MD

## 2018-12-03 NOTE — Progress Notes (Signed)
Established Patient Office Visit  Subjective:  Patient ID: Isaiah Cordova, male    DOB: 08/07/1970  Age: 48 y.o. MRN: [id number]  CC:  Chief Complaint  Patient presents with  . Follow-up    HPI Isaiah Cordova presents for follow-up of his recent viral syndrome.  That is totally resolved.  He is no longer experiencing any shortness of breath.  He is started some Zyrtec and that has improved his symptoms greatly.  Longstanding history of allergies to pollen grass and dust.  He continues to experience a great deal of anxiety about his job.  Concerned that the pandemic will take away the football season and then he will most probably lose his job.  Longstanding history of dysthymia if not depression since his teenage years.  Has been experiencing mood swings and snapping at his wife and 68-year-old basis.  Sleeping particularly well.  Is thought that things might be better off if he were not here.  Denies any serious intent to self-harm.  Believes that he will need a CTE, he has had multiple concussions throughout his life.  Last MRI was 5 years ago.  Taking testosterone sporadically.  He is now drinking up to a 12 pack of beer weekly.  Past Medical History:  Diagnosis Date  . Anxiety   . Concussion   . Depression   . Gastric ulcer   . GERD (gastroesophageal reflux disease)   . Headache(784.0)    chronic  . Hemorrhoids, external   . Irritable bowel   . Neuromuscular disorder (HCC)    RLS  . Rectal bleeding    history of, negative colonoscopy 2005-2006    Past Surgical History:  Procedure Laterality Date  . COLONOSCOPY    . HERNIA REPAIR    . NASAL SEPTUM SURGERY    . RETINAL DETACHMENT SURGERY      Family History  Problem Relation Age of Onset  . Hypertension Father   . Diabetes Father   . Hyperlipidemia Father   . Prostate cancer Father   . Depression Maternal Grandfather        prostate  . Depression Paternal Grandfather        committed suicide  . Hypothyroidism  Other   . Diabetes Mellitus II Brother     Social History   Socioeconomic History  . Marital status: Married    Spouse name: [name]  . Number of children: 1  . Years of education: Not on file  . Highest education level: Not on file  Occupational History  . 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    Employer: [employer]  Social Needs  . Financial resource strain: Not on file  . Food insecurity    Worry: Not on file    Inability: Not on file  . Transportation needs    Medical: Not on file    Non-medical: Not on file  Tobacco Use  . Smoking status: Never Smoker  . Smokeless tobacco: Never Used  Substance and Sexual Activity  . Alcohol use: Yes    Alcohol/week: 2.0 standard drinks    Types: 2 Cans of beer per week  . Drug use: Not on file  . Sexual activity: Not on file  Lifestyle  . Physical activity    Days per week: Not on file    Minutes per session: Not on file  . Stress: Not on file  Relationships  . Social connections    Talks on phone: Not on file  Gets together: Not on file    Attends religious service: Not on file    Active member of club or organization: Not on file    Attends meetings of clubs or organizations: Not on file    Relationship status: Not on file  . Intimate partner violence    Fear of current or ex partner: Not on file    Emotionally abused: Not on file    Physically abused: Not on file    Forced sexual activity: Not on file  Other Topics Concern  . Not on file  Social History Narrative   Regular exercise - yes   1 daughter 21 years old    Outpatient Medications Prior to Visit  Medication Sig Dispense Refill  . Cyanocobalamin (B-12 PO) Take by mouth. 1 tab daily    . latanoprost (XALATAN) 0.005 % ophthalmic solution INT 1 GTT INTO OU HS  4  . Multiple Vitamins-Minerals (ONE-A-DAY MENS HEALTH FORMULA PO) Take 1 tablet by mouth daily.    . Omega-3 Fatty Acids (FISH OIL) 1000 MG CAPS Take 1,000 mg by mouth daily.    . prednisoLONE  acetate (PRED FORTE) 1 % ophthalmic suspension   0  . testosterone cypionate (DEPOTESTOTERONE CYPIONATE) 100 MG/ML injection INJECT 1.5 ML IN THE MUSCLE EVERY 14 DAYS 10 mL 2  . timolol (TIMOPTIC) 0.25 % ophthalmic solution Place 1 drop into the left eye daily.       No facility-administered medications prior to visit.     Allergies  Allergen Reactions  . Dust Mite Extract   . Lisdexamfetamine     REACTION: chest pains  . Other     GRASS  . Pollen Extract     ROS Review of Systems  Constitutional: Negative.   HENT: Positive for congestion, postnasal drip and sneezing.   Eyes: Negative for photophobia and visual disturbance.  Respiratory: Negative for shortness of breath and wheezing.   Cardiovascular: Negative.   Gastrointestinal: Negative.   Genitourinary: Negative.   Musculoskeletal: Negative for gait problem and joint swelling.  Skin: Negative for pallor and rash.  Neurological: Negative for light-headedness and numbness.  Hematological: Negative.   Psychiatric/Behavioral: Positive for dysphoric mood and sleep disturbance. Negative for self-injury and suicidal ideas. The patient is nervous/anxious.       Objective:    Physical Exam  Constitutional: He is oriented to person, place, and time. He appears well-developed and well-nourished. No distress.  HENT:  Head: Normocephalic and atraumatic.  Right Ear: External ear normal.  Left Ear: External ear normal.  Eyes: Conjunctivae are normal. Right eye exhibits no discharge. No scleral icterus.  Neck: No JVD present. No tracheal deviation present.  Pulmonary/Chest: Effort normal. No stridor.  Neurological: He is alert and oriented to person, place, and time.  Skin: Skin is warm and dry. He is not diaphoretic.  Psychiatric: He has a normal mood and affect. His behavior is normal.    BP 126/80   Pulse 67   Temp 98 F (36.7 C) (Oral)   Ht 6' 1.25" (1.861 m)   Wt 207 lb 2 oz (94 kg)   SpO2 97%   BMI 27.14 kg/m  Wt  Readings from Last 3 Encounters:  12/03/18 207 lb 2 oz (94 kg)  07/27/18 206 lb 3.2 oz (93.5 kg)  05/21/18 212 lb 8 oz (96.4 kg)   BP Readings from Last 3 Encounters:  12/03/18 126/80  07/27/18 126/78  05/21/18 126/80   Guideline developer:  UpToDate (see UpToDate for  funding source) Date Released: June 2014  There are no preventive care reminders to display for this patient.  There are no preventive care reminders to display for this patient.  Lab Results  Component Value Date   TSH 1.41 10/28/2016   Lab Results  Component Value Date   WBC 5.5 07/27/2018   HGB 15.7 07/27/2018   HCT 46.3 07/27/2018   MCV 89.3 07/27/2018   PLT 232.0 07/27/2018   Lab Results  Component Value Date   NA 139 07/27/2018   K 4.3 07/27/2018   CO2 27 07/27/2018   GLUCOSE 79 07/27/2018   BUN 16 07/27/2018   CREATININE 0.81 07/27/2018   BILITOT 2.1 (H) 07/27/2018   ALKPHOS 62 07/27/2018   AST 21 07/27/2018   ALT 18 07/27/2018   PROT 6.8 07/27/2018   ALBUMIN 4.7 07/27/2018   CALCIUM 9.6 07/27/2018   ANIONGAP 14 06/21/2015   GFR 101.54 07/27/2018   Lab Results  Component Value Date   CHOL 224 (H) 11/03/2016   Lab Results  Component Value Date   HDL 55.20 11/03/2016   Lab Results  Component Value Date   LDLCALC 139 (H) 11/03/2016   Lab Results  Component Value Date   TRIG 150.0 (H) 11/03/2016   Lab Results  Component Value Date   CHOLHDL 4 11/03/2016   Lab Results  Component Value Date   HGBA1C 5.2 10/28/2011      Assessment & Plan:   Problem List Items Addressed This Visit    None    Visit Diagnoses    Depression with anxiety    -  Primary   Relevant Medications   PARoxetine (PAXIL) 10 MG tablet      Meds ordered this encounter  Medications  . PARoxetine (PAXIL) 10 MG tablet    Sig: Take one daily for one week and then increase to 2 daily.    Dispense:  60 tablet    Refill:  1    Follow-up: Return in about 1 month (around 01/03/2019).    Patient was  given information on coping with depression and anxiety.  He was given information on mindfulness stress reduction.  Discussed Paxil adjusting time of day that he takes it.  Follow-up will be in 1 month.  Encouraged him to continue exercising and to take his testosterone on a regular 2-week schedule.

## 2018-12-21 DIAGNOSIS — H35373 Puckering of macula, bilateral: Secondary | ICD-10-CM | POA: Diagnosis not present

## 2018-12-21 DIAGNOSIS — H35032 Hypertensive retinopathy, left eye: Secondary | ICD-10-CM | POA: Diagnosis not present

## 2018-12-21 DIAGNOSIS — H3023 Posterior cyclitis, bilateral: Secondary | ICD-10-CM | POA: Diagnosis not present

## 2018-12-21 DIAGNOSIS — H31093 Other chorioretinal scars, bilateral: Secondary | ICD-10-CM | POA: Diagnosis not present

## 2019-01-07 ENCOUNTER — Encounter: Payer: Self-pay | Admitting: Family Medicine

## 2019-01-07 ENCOUNTER — Ambulatory Visit: Payer: BC Managed Care – PPO | Admitting: Family Medicine

## 2019-01-07 VITALS — BP 128/80 | HR 73 | Ht 73.25 in | Wt 204.4 lb

## 2019-01-07 DIAGNOSIS — R5383 Other fatigue: Secondary | ICD-10-CM

## 2019-01-07 DIAGNOSIS — Z0000 Encounter for general adult medical examination without abnormal findings: Secondary | ICD-10-CM | POA: Diagnosis not present

## 2019-01-07 DIAGNOSIS — E291 Testicular hypofunction: Secondary | ICD-10-CM | POA: Diagnosis not present

## 2019-01-07 DIAGNOSIS — F418 Other specified anxiety disorders: Secondary | ICD-10-CM

## 2019-01-07 MED ORDER — TRAZODONE HCL 50 MG PO TABS: 25.0000 mg | ORAL_TABLET | Freq: Every evening | ORAL | 2 refills | Status: DC | PRN

## 2019-01-07 MED ORDER — FLUOXETINE HCL 10 MG PO CAPS: 10.0000 mg | ORAL_CAPSULE | Freq: Every day | ORAL | 1 refills | Status: DC

## 2019-01-07 NOTE — Progress Notes (Signed)
Established Patient Office Visit  Subjective:  Patient ID: Isaiah Cordova, male    DOB: 1970/10/24  Age: 48 y.o. MRN: [id number]  CC:  Chief Complaint  Patient presents with  . Follow-up    HPI Isaiah Cordova presents for follow-up of his depression with anxiety.  He does not seem to be tolerating the Paxil at all.  It is not helping with sleeping he is tired throughout the day after taking it.  He is experiencing paresthesias in both of his hands when he wakes up at night.  Says that his anxiety is out of the roof.  He did not increase the dosage as planned after a week.  He remains quite anxious worrying about his job.  He is been unmotivated to get out of the bed in the morning and his wife is concerned.  He has been more compliant with his testosterone. He is concerned that a job loss is looming.   Past Medical History:  Diagnosis Date  . Anxiety   . Concussion   . Depression   . Gastric ulcer   . GERD (gastroesophageal reflux disease)   . Headache(784.0)    chronic  . Hemorrhoids, external   . Irritable bowel   . Neuromuscular disorder (HCC)    RLS  . Rectal bleeding    history of, negative colonoscopy 2005-2006    Past Surgical History:  Procedure Laterality Date  . COLONOSCOPY    . HERNIA REPAIR    . NASAL SEPTUM SURGERY    . RETINAL DETACHMENT SURGERY      Family History  Problem Relation Age of Onset  . Hypertension Father   . Diabetes Father   . Hyperlipidemia Father   . Prostate cancer Father   . Depression Maternal Grandfather        prostate  . Depression Paternal Grandfather        committed suicide  . Hypothyroidism Other   . Diabetes Mellitus II Brother     Social History   Socioeconomic History  . Marital status: Married    Spouse name: [name]  . Number of children: 1  . Years of education: Not on file  . Highest education level: Not on file  Occupational History  . 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    Employer: [employer]   Social Needs  . Financial resource strain: Not on file  . Food insecurity    Worry: Not on file    Inability: Not on file  . Transportation needs    Medical: Not on file    Non-medical: Not on file  Tobacco Use  . Smoking status: Never Smoker  . Smokeless tobacco: Never Used  Substance and Sexual Activity  . Alcohol use: Yes    Alcohol/week: 2.0 standard drinks    Types: 2 Cans of beer per week  . Drug use: Not on file  . Sexual activity: Not on file  Lifestyle  . Physical activity    Days per week: Not on file    Minutes per session: Not on file  . Stress: Not on file  Relationships  . Social Herbalist on phone: Not on file    Gets together: Not on file    Attends religious service: Not on file    Active member of club or organization: Not on file    Attends meetings of clubs or organizations: Not on file    Relationship status: Not on file  . Intimate  partner violence    Fear of current or ex partner: Not on file    Emotionally abused: Not on file    Physically abused: Not on file    Forced sexual activity: Not on file  Other Topics Concern  . Not on file  Social History Narrative   Regular exercise - yes   1 daughter 47 years old    Outpatient Medications Prior to Visit  Medication Sig Dispense Refill  . Cyanocobalamin (B-12 PO) Take by mouth. 1 tab daily    . latanoprost (XALATAN) 0.005 % ophthalmic solution INT 1 GTT INTO OU HS  4  . Multiple Vitamins-Minerals (ONE-A-DAY MENS HEALTH FORMULA PO) Take 1 tablet by mouth daily.    . Omega-3 Fatty Acids (FISH OIL) 1000 MG CAPS Take 1,000 mg by mouth daily.    . prednisoLONE acetate (PRED FORTE) 1 % ophthalmic suspension   0  . testosterone cypionate (DEPOTESTOTERONE CYPIONATE) 100 MG/ML injection INJECT 1.5 ML IN THE MUSCLE EVERY 14 DAYS 10 mL 2  . timolol (TIMOPTIC) 0.25 % ophthalmic solution Place 1 drop into the left eye daily.      Marland Kitchen PARoxetine (PAXIL) 10 MG tablet Take one daily for one week and then  increase to 2 daily. 60 tablet 1   No facility-administered medications prior to visit.     Allergies  Allergen Reactions  . Dust Mite Extract   . Lisdexamfetamine     REACTION: chest pains  . Other     GRASS  . Pollen Extract     ROS Review of Systems  Constitutional: Negative.   HENT: Negative.   Eyes: Negative for photophobia and visual disturbance.  Respiratory: Negative.   Cardiovascular: Negative.   Gastrointestinal: Negative.   Psychiatric/Behavioral: Positive for dysphoric mood and sleep disturbance. Negative for self-injury and suicidal ideas.      Objective:    Physical Exam  Constitutional: He is oriented to person, place, and time. He appears well-developed and well-nourished. No distress.  HENT:  Head: Normocephalic and atraumatic.  Right Ear: External ear normal.  Left Ear: External ear normal.  Eyes: Right eye exhibits no discharge. Left eye exhibits no discharge. No scleral icterus.  Neck: No JVD present. No tracheal deviation present.  Pulmonary/Chest: Effort normal. No stridor.  Neurological: He is alert and oriented to person, place, and time.  Skin: Skin is warm and dry. He is not diaphoretic.  Psychiatric: He has a normal mood and affect. His behavior is normal.    BP 128/80   Pulse 73   Ht 6' 1.25" (1.861 m)   Wt 204 lb 6 oz (92.7 kg)   SpO2 97%   BMI 26.78 kg/m  Wt Readings from Last 3 Encounters:  01/07/19 204 lb 6 oz (92.7 kg)  12/03/18 207 lb 2 oz (94 kg)  07/27/18 206 lb 3.2 oz (93.5 kg)   BP Readings from Last 3 Encounters:  01/07/19 128/80  12/03/18 126/80  07/27/18 126/78   Guideline developer:  UpToDate (see UpToDate for funding source) Date Released: June 2014  Health Maintenance Due  Topic Date Due  . INFLUENZA VACCINE  12/29/2018    There are no preventive care reminders to display for this patient.  Lab Results  Component Value Date   TSH 1.41 10/28/2016   Lab Results  Component Value Date   WBC 5.5  07/27/2018   HGB 15.7 07/27/2018   HCT 46.3 07/27/2018   MCV 89.3 07/27/2018   PLT 232.0 07/27/2018  Lab Results  Component Value Date   NA 139 07/27/2018   K 4.3 07/27/2018   CO2 27 07/27/2018   GLUCOSE 79 07/27/2018   BUN 16 07/27/2018   CREATININE 0.81 07/27/2018   BILITOT 2.1 (H) 07/27/2018   ALKPHOS 62 07/27/2018   AST 21 07/27/2018   ALT 18 07/27/2018   PROT 6.8 07/27/2018   ALBUMIN 4.7 07/27/2018   CALCIUM 9.6 07/27/2018   ANIONGAP 14 06/21/2015   GFR 101.54 07/27/2018   Lab Results  Component Value Date   CHOL 224 (H) 11/03/2016   Lab Results  Component Value Date   HDL 55.20 11/03/2016   Lab Results  Component Value Date   LDLCALC 139 (H) 11/03/2016   Lab Results  Component Value Date   TRIG 150.0 (H) 11/03/2016   Lab Results  Component Value Date   CHOLHDL 4 11/03/2016   Lab Results  Component Value Date   HGBA1C 5.2 10/28/2011      Assessment & Plan:   Problem List Items Addressed This Visit      Endocrine   Androgen deficiency   Relevant Orders   Testosterone     Other   Fatigue   Relevant Orders   Testosterone   TSH   Health care maintenance   Relevant Orders   CBC   Comprehensive metabolic panel   Lipid panel   Urinalysis, Routine w reflex microscopic    Other Visit Diagnoses    Depression with anxiety    -  Primary   Relevant Medications   FLUoxetine (PROZAC) 10 MG capsule   traZODone (DESYREL) 50 MG tablet      Meds ordered this encounter  Medications  . FLUoxetine (PROZAC) 10 MG capsule    Sig: Take 1 capsule (10 mg total) by mouth daily. For one week and then increase to 2 each morning.    Dispense:  60 capsule    Refill:  1  . traZODone (DESYREL) 50 MG tablet    Sig: Take 0.5-1 tablets (25-50 mg total) by mouth at bedtime as needed for sleep.    Dispense:  30 tablet    Refill:  2    Follow-up: Return in about 1 month (around 02/07/2019).   Suggested counseling and patient declines.  Encouraged him to be  sure to increase the dosage of Prozac after a week and take it only in the morning.  Trazodone nightly for sleep.  Patient will follow-up also for his yearly physical next month.  Blood work was ordered today.

## 2019-01-14 DIAGNOSIS — H0012 Chalazion right lower eyelid: Secondary | ICD-10-CM | POA: Diagnosis not present

## 2019-01-14 DIAGNOSIS — H35372 Puckering of macula, left eye: Secondary | ICD-10-CM | POA: Diagnosis not present

## 2019-01-14 DIAGNOSIS — H43391 Other vitreous opacities, right eye: Secondary | ICD-10-CM | POA: Diagnosis not present

## 2019-01-14 DIAGNOSIS — H43811 Vitreous degeneration, right eye: Secondary | ICD-10-CM | POA: Diagnosis not present

## 2019-01-17 ENCOUNTER — Telehealth: Payer: Self-pay

## 2019-01-17 NOTE — Telephone Encounter (Signed)

## 2019-01-18 ENCOUNTER — Other Ambulatory Visit (INDEPENDENT_AMBULATORY_CARE_PROVIDER_SITE_OTHER): Payer: BC Managed Care – PPO

## 2019-01-18 ENCOUNTER — Other Ambulatory Visit: Payer: Self-pay

## 2019-01-18 ENCOUNTER — Encounter: Payer: BLUE CROSS/BLUE SHIELD | Admitting: Family Medicine

## 2019-01-18 DIAGNOSIS — Z0000 Encounter for general adult medical examination without abnormal findings: Secondary | ICD-10-CM

## 2019-01-18 DIAGNOSIS — R5383 Other fatigue: Secondary | ICD-10-CM

## 2019-01-18 DIAGNOSIS — E291 Testicular hypofunction: Secondary | ICD-10-CM

## 2019-01-18 LAB — COMPREHENSIVE METABOLIC PANEL
ALT: 16 U/L (ref 0–53)
AST: 18 U/L (ref 0–37)
Albumin: 4.8 g/dL (ref 3.5–5.2)
Alkaline Phosphatase: 66 U/L (ref 39–117)
BUN: 15 mg/dL (ref 6–23)
CO2: 30 mEq/L (ref 19–32)
Calcium: 9.3 mg/dL (ref 8.4–10.5)
Chloride: 103 mEq/L (ref 96–112)
Creatinine, Ser: 0.98 mg/dL (ref 0.40–1.50)
GFR: 81.33 mL/min (ref >60.00)
Glucose, Bld: 101 mg/dL — ABNORMAL HIGH (ref 70–99)
Potassium: 4.4 mEq/L (ref 3.5–5.1)
Sodium: 138 mEq/L (ref 135–145)
Total Bilirubin: 2.1 mg/dL — ABNORMAL HIGH (ref 0.2–1.2)
Total Protein: 7 g/dL (ref 6.0–8.3)

## 2019-01-18 LAB — URINALYSIS, ROUTINE W REFLEX MICROSCOPIC
Bilirubin Urine: NEGATIVE
Ketones, ur: NEGATIVE
Leukocytes,Ua: NEGATIVE
Nitrite: NEGATIVE
RBC / HPF: NONE SEEN (ref 0–?)
Specific Gravity, Urine: 1.025 (ref 1.000–1.030)
Total Protein, Urine: NEGATIVE
Urine Glucose: NEGATIVE
Urobilinogen, UA: 0.2 (ref 0.0–1.0)
WBC, UA: NONE SEEN (ref 0–?)
pH: 5.5 (ref 5.0–8.0)

## 2019-01-18 LAB — CBC
HCT: 46.8 % (ref 39.0–52.0)
Hemoglobin: 15.6 g/dL (ref 13.0–17.0)
MCHC: 33.4 g/dL (ref 30.0–36.0)
MCV: 91.4 fl (ref 78.0–100.0)
Platelets: 255 10*3/uL (ref 150.0–400.0)
RBC: 5.11 Mil/uL (ref 4.22–5.81)
RDW: 12.9 % (ref 11.5–15.5)
WBC: 6.2 10*3/uL (ref 4.0–10.5)

## 2019-01-18 LAB — LIPID PANEL
Cholesterol: 221 mg/dL — ABNORMAL HIGH (ref 0–200)
HDL: 46.9 mg/dL (ref >39.00)
LDL Cholesterol: 151 mg/dL — ABNORMAL HIGH (ref 0–99)
NonHDL: 174.19
Total CHOL/HDL Ratio: 5
Triglycerides: 115 mg/dL (ref 0.0–149.0)
VLDL: 23 mg/dL (ref 0.0–40.0)

## 2019-01-18 LAB — TSH: TSH: 1.41 u[IU]/mL (ref 0.35–4.50)

## 2019-01-18 LAB — TESTOSTERONE: Testosterone: 899.22 ng/dL — ABNORMAL HIGH (ref 300.00–890.00)

## 2019-02-07 ENCOUNTER — Ambulatory Visit (INDEPENDENT_AMBULATORY_CARE_PROVIDER_SITE_OTHER): Payer: BC Managed Care – PPO | Admitting: Family Medicine

## 2019-02-07 ENCOUNTER — Encounter: Payer: Self-pay | Admitting: Family Medicine

## 2019-02-07 ENCOUNTER — Other Ambulatory Visit: Payer: Self-pay

## 2019-02-07 VITALS — BP 130/80 | HR 69 | Ht 73.25 in | Wt 204.0 lb

## 2019-02-07 DIAGNOSIS — E7800 Pure hypercholesterolemia, unspecified: Secondary | ICD-10-CM

## 2019-02-07 DIAGNOSIS — E291 Testicular hypofunction: Secondary | ICD-10-CM

## 2019-02-07 DIAGNOSIS — Z0000 Encounter for general adult medical examination without abnormal findings: Secondary | ICD-10-CM

## 2019-02-07 DIAGNOSIS — F418 Other specified anxiety disorders: Secondary | ICD-10-CM

## 2019-02-07 HISTORY — DX: Other specified anxiety disorders: F41.8

## 2019-02-07 HISTORY — DX: Pure hypercholesterolemia, unspecified: E78.00

## 2019-02-07 MED ORDER — FLUOXETINE HCL 20 MG PO TABS: 20.0000 mg | ORAL_TABLET | Freq: Every day | ORAL | 3 refills | Status: DC

## 2019-02-07 NOTE — Patient Instructions (Signed)
Health Maintenance, Male Adopting a healthy lifestyle and getting preventive care are important in promoting health and wellness. Ask your health care provider about:  The right schedule for you to have regular tests and exams.  Things you can do on your own to prevent diseases and keep yourself healthy. What should I know about diet, weight, and exercise? Eat a healthy diet   Eat a diet that includes plenty of vegetables, fruits, low-fat dairy products, and lean protein.  Do not eat a lot of foods that are high in solid fats, added sugars, or sodium. Maintain a healthy weight Body mass index (BMI) is a measurement that can be used to identify possible weight problems. It estimates body fat based on height and weight. Your health care provider can help determine your BMI and help you achieve or maintain a healthy weight. Get regular exercise Get regular exercise. This is one of the most important things you can do for your health. Most adults should:  Exercise for at least 150 minutes each week. The exercise should increase your heart rate and make you sweat (moderate-intensity exercise).  Do strengthening exercises at least twice a week. This is in addition to the moderate-intensity exercise.  Spend less time sitting. Even light physical activity can be beneficial. Watch cholesterol and blood lipids Have your blood tested for lipids and cholesterol at 49 years of age, then have this test every 5 years. You may need to have your cholesterol levels checked more often if:  Your lipid or cholesterol levels are high.  You are older than 49 years of age.  You are at high risk for heart disease. What should I know about cancer screening? Many types of cancers can be detected early and may often be prevented. Depending on your health history and family history, you may need to have cancer screening at various ages. This may include screening for:  Colorectal cancer.  Prostate cancer.   Skin cancer.  Lung cancer. What should I know about heart disease, diabetes, and high blood pressure? Blood pressure and heart disease  High blood pressure causes heart disease and increases the risk of stroke. This is more likely to develop in people who have high blood pressure readings, are of African descent, or are overweight.  Talk with your health care provider about your target blood pressure readings.  Have your blood pressure checked: ? Every 3-5 years if you are 49-12 years of age. ? Every year if you are 26 years old or older.  If you are between the ages of 57 and 39 and are a current or former smoker, ask your health care provider if you should have a one-time screening for abdominal aortic aneurysm (AAA). Diabetes Have regular diabetes screenings. This checks your fasting blood sugar level. Have the screening done:  Once every three years after age 15 if you are at a normal weight and have a low risk for diabetes.  More often and at a younger age if you are overweight or have a high risk for diabetes. What should I know about preventing infection? Hepatitis B If you have a higher risk for hepatitis B, you should be screened for this virus. Talk with your health care provider to find out if you are at risk for hepatitis B infection. Hepatitis C Blood testing is recommended for:  Everyone born from [birth year] through 1966.  Anyone with known risk factors for hepatitis C. Sexually transmitted infections (STIs)  You should be screened each year  for STIs, including gonorrhea and chlamydia, if: ? You are sexually active and are younger than 49 years of age. ? You are older than 49 years of age and your health care provider tells you that you are at risk for this type of infection. ? Your sexual activity has changed since you were last screened, and you are at increased risk for chlamydia or gonorrhea. Ask your health care provider if you are at risk.  Ask your health care  provider about whether you are at high risk for HIV. Your health care provider may recommend a prescription medicine to help prevent HIV infection. If you choose to take medicine to prevent HIV, you should first get tested for HIV. You should then be tested every 3 months for as long as you are taking the medicine. Follow these instructions at home: Lifestyle  Do not use any products that contain nicotine or tobacco, such as cigarettes, e-cigarettes, and chewing tobacco. If you need help quitting, ask your health care provider.  Do not use street drugs.  Do not share needles.  Ask your health care provider for help if you need support or information about quitting drugs. Alcohol use  Do not drink alcohol if your health care provider tells you not to drink.  If you drink alcohol: ? Limit how much you have to 0-2 drinks a day. ? Be aware of how much alcohol is in your drink. In the [place] one drink equals one 12 oz bottle of beer (355 mL), one 5 oz glass of wine (148 mL), or one 1 oz glass of hard liquor (44 mL). General instructions  Schedule regular health, dental, and eye exams.  Stay current with your vaccines.  Tell your health care provider if: ? You often feel depressed. ? You have ever been abused or do not feel safe at home. Summary  Adopting a healthy lifestyle and getting preventive care are important in promoting health and wellness.  Follow your health care provider's instructions about healthy diet, exercising, and getting tested or screened for diseases.  Follow your health care provider's instructions on monitoring your cholesterol and blood pressure. This information is not intended to replace advice given to you by your health care provider. Make sure you discuss any questions you have with your health care provider. Document Released: 11/12/2007 Document Revised: 05/09/2018 Document Reviewed: 05/09/2018 Elsevier Patient Education  2020 [employer]  Preventive  Care 49-56 Years Old, Male Preventive care refers to lifestyle choices and visits with your health care provider that can promote health and wellness. This includes:  A yearly physical exam. This is also called an annual well check.  Regular dental and eye exams.  Immunizations.  Screening for certain conditions.  Healthy lifestyle choices, such as eating a healthy diet, getting regular exercise, not using drugs or products that contain nicotine and tobacco, and limiting alcohol use. What can I expect for my preventive care visit? Physical exam Your health care provider will check:  Height and weight. These may be used to calculate body mass index (BMI), which is a measurement that tells if you are at a healthy weight.  Heart rate and blood pressure.  Your skin for abnormal spots. Counseling Your health care provider may ask you questions about:  Alcohol, tobacco, and drug use.  Emotional well-being.  Home and relationship well-being.  Sexual activity.  Eating habits.  Work and work Statistician. What immunizations do I need?  Influenza (flu) vaccine  This is recommended every  year. Tetanus, diphtheria, and pertussis (Tdap) vaccine  You may need a Td booster every 10 years. Varicella (chickenpox) vaccine  You may need this vaccine if you have not already been vaccinated. Zoster (shingles) vaccine  You may need this after age 70. Measles, mumps, and rubella (MMR) vaccine  You may need at least one dose of MMR if you were born in 1957 or later. You may also need a second dose. Pneumococcal conjugate (PCV13) vaccine  You may need this if you have certain conditions and were not previously vaccinated. Pneumococcal polysaccharide (PPSV23) vaccine  You may need one or two doses if you smoke cigarettes or if you have certain conditions. Meningococcal conjugate (MenACWY) vaccine  You may need this if you have certain conditions. Hepatitis A vaccine  You may need  this if you have certain conditions or if you travel or work in places where you may be exposed to hepatitis A. Hepatitis B vaccine  You may need this if you have certain conditions or if you travel or work in places where you may be exposed to hepatitis B. Haemophilus influenzae type b (Hib) vaccine  You may need this if you have certain risk factors. Human papillomavirus (HPV) vaccine  If recommended by your health care provider, you may need three doses over 6 months. You may receive vaccines as individual doses or as more than one vaccine together in one shot (combination vaccines). Talk with your health care provider about the risks and benefits of combination vaccines. What tests do I need? Blood tests  Lipid and cholesterol levels. These may be checked every 5 years, or more frequently if you are over 25 years old.  Hepatitis C test.  Hepatitis B test. Screening  Lung cancer screening. You may have this screening every year starting at age 40 if you have a 30-pack-year history of smoking and currently smoke or have quit within the past 15 years.  Prostate cancer screening. Recommendations will vary depending on your family history and other risks.  Colorectal cancer screening. All adults should have this screening starting at age 77 and continuing until age 10. Your health care provider may recommend screening at age 33 if you are at increased risk. You will have tests every 1-10 years, depending on your results and the type of screening test.  Diabetes screening. This is done by checking your blood sugar (glucose) after you have not eaten for a while (fasting). You may have this done every 1-3 years.  Sexually transmitted disease (STD) testing. Follow these instructions at home: Eating and drinking  Eat a diet that includes fresh fruits and vegetables, whole grains, lean protein, and low-fat dairy products.  Take vitamin and mineral supplements as recommended by your health  care provider.  Do not drink alcohol if your health care provider tells you not to drink.  If you drink alcohol: ? Limit how much you have to 0-2 drinks a day. ? Be aware of how much alcohol is in your drink. In the [place] one drink equals one 12 oz bottle of beer (355 mL), one 5 oz glass of wine (148 mL), or one 1 oz glass of hard liquor (44 mL). Lifestyle  Take daily care of your teeth and gums.  Stay active. Exercise for at least 30 minutes on 5 or more days each week.  Do not use any products that contain nicotine or tobacco, such as cigarettes, e-cigarettes, and chewing tobacco. If you need help quitting, ask your health care  provider.  If you are sexually active, practice safe sex. Use a condom or other form of protection to prevent STIs (sexually transmitted infections).  Talk with your health care provider about taking a low-dose aspirin every day starting at age 17. What's next?  Go to your health care provider once a year for a well check visit.  Ask your health care provider how often you should have your eyes and teeth checked.  Stay up to date on all vaccines. This information is not intended to replace advice given to you by your health care provider. Make sure you discuss any questions you have with your health care provider. Document Released: 06/12/2015 Document Revised: 05/10/2018 Document Reviewed: 05/10/2018 Elsevier Patient Education  2020 [place].  Preventing High Cholesterol Cholesterol is a white, waxy substance similar to fat that the human body needs to help build cells. The liver makes all the cholesterol that a person's body needs. Having high cholesterol (hypercholesterolemia) increases a person's risk for heart disease and stroke. Extra (excess) cholesterol comes from the food the person eats. High cholesterol can often be prevented with diet and lifestyle changes. If you already have high cholesterol, you can control it with diet and lifestyle  changes and with medicine. How can high cholesterol affect me? If you have high cholesterol, deposits (plaques) may build up on the walls of your arteries. The arteries are the blood vessels that carry blood away from your heart. Plaques make the arteries narrower and stiffer. This can limit or block blood flow and cause blood clots to form. Blood clots:  Are tiny balls of cells that form in your blood.  Can move to the heart or brain, causing a heart attack or stroke. Plaques in arteries greatly increase your risk for heart attack and stroke.Making diet and lifestyle changes can reduce your risk for these conditions that may threaten your life. What can increase my risk? This condition is more likely to develop in people who:  Eat foods that are high in saturated fat or cholesterol. Saturated fat is mostly found in: ? Foods that contain animal fat, such as red meat and some dairy products. ? Certain fatty foods made from plants, such as tropical oils.  Are overweight.  Are not getting enough exercise.  Have a family history of high cholesterol. What actions can I take to prevent this? Nutrition   Eat less saturated fat.  Avoid trans fats (partially hydrogenated oils). These are often found in margarine and in some baked goods, fried foods, and snacks bought in packages.  Avoid precooked or cured meat, such as sausages or meat loaves.  Avoid foods and drinks that have added sugars.  Eat more fruits, vegetables, and whole grains.  Choose healthy sources of protein, such as fish, poultry, lean cuts of red meat, beans, peas, lentils, and nuts.  Choose healthy sources of fat, such as: ? Nuts. ? Vegetable oils, especially olive oil. ? Fish that have healthy fats (omega-3 fatty acids), such as mackerel or salmon. The items listed above may not be a complete list of recommended foods and beverages. Contact a dietitian for more information. Lifestyle  Lose weight if you are  overweight. Losing 5-10 lb (2.3-4.5 kg) can help prevent or control high cholesterol. It can also lower your risk for diabetes and high blood pressure. Ask your health care provider to help you with a diet and exercise plan to lose weight safely.  Do not use any products that contain nicotine or tobacco,  such as cigarettes, e-cigarettes, and chewing tobacco. If you need help quitting, ask your health care provider.  Limit your alcohol intake. ? Do not drink alcohol if:  Your health care provider tells you not to drink.  You are pregnant, may be pregnant, or are planning to become pregnant. ? If you drink alcohol:  Limit how much you use to:  0-1 drink a day for women.  0-2 drinks a day for men.  Be aware of how much alcohol is in your drink. In the [place] one drink equals one 12 oz bottle of beer (355 mL), one 5 oz glass of wine (148 mL), or one 1 oz glass of hard liquor (44 mL). Activity   Get enough exercise. Each week, do at least 150 minutes of exercise that takes a medium level of effort (moderate-intensity exercise). ? This is exercise that:  Makes your heart beat faster and makes you breathe harder than usual.  Allows you to still be able to talk. ? You could exercise in short sessions several times a day or longer sessions a few times a week. For example, on 5 days each week, you could walk fast or ride your bike 3 times a day for 10 minutes each time.  Do exercises as told by your health care provider. Medicines  In addition to diet and lifestyle changes, your health care provider may recommend medicines to help lower cholesterol. This may be a medicine to lower the amount of cholesterol your liver makes. You may need medicine if: ? Diet and lifestyle changes do not lower your cholesterol enough. ? You have high cholesterol and other risk factors for heart disease or stroke.  Take over-the-counter and prescription medicines only as told by your health care provider.  General information  Manage your risk factors for high cholesterol. Talk with your health care provider about all your risk factors and how to lower your risk.  Manage other conditions that you have, such as diabetes or high blood pressure (hypertension).  Have blood tests to check your cholesterol levels at regular points in time as told by your health care provider.  Keep all follow-up visits as told by your health care provider. This is important. Where to find more information  American Heart Association: www.heart.org  National Heart, Lung, and Blood Institute: https://wilson-eaton.com/ Summary  High cholesterol increases your risk for heart disease and stroke. By keeping your cholesterol level low, you can reduce your risk for these conditions.  High cholesterol can often be prevented with diet and lifestyle changes.  Work with your health care provider to manage your risk factors, and have your blood tested regularly. This information is not intended to replace advice given to you by your health care provider. Make sure you discuss any questions you have with your health care provider. Document Released: 05/31/2015 Document Revised: 09/07/2018 Document Reviewed: 01/23/2016 Elsevier Patient Education  2020 [employer].

## 2019-02-07 NOTE — Progress Notes (Signed)
Established Patient Office Visit  Subjective:  Patient ID: Isaiah Cordova, male    DOB: [birth year]  Age: 48 y.o. MRN: [id number]  CC:  Chief Complaint  Patient presents with  . Annual Exam    HPI Isaiah Cordova presents for follow-up follow-up of his depression and for complete physical exam.  He says that things may be improving with the Prozac.  He has noted less emotional about volitility.  Continues to exercise.  He has stopped drinking since July.  He saw his optometrist recently and he was treated for stye.  He has a dental exam scheduled for next week.  He and his wife seem to be getting along better.  He has a stye on his left lower eyelid that he is treating with warm compresses and antibiotic ointment given to him by his eye physician.  Past Medical History:  Diagnosis Date  . Anxiety   . Concussion   . Depression   . Gastric ulcer   . GERD (gastroesophageal reflux disease)   . Headache(784.0)    chronic  . Hemorrhoids, external   . Irritable bowel   . Neuromuscular disorder (HCC)    RLS  . Rectal bleeding    history of, negative colonoscopy 2005-2006    Past Surgical History:  Procedure Laterality Date  . COLONOSCOPY    . HERNIA REPAIR    . NASAL SEPTUM SURGERY    . RETINAL DETACHMENT SURGERY      Family History  Problem Relation Age of Onset  . Hypertension Father   . Diabetes Father   . Hyperlipidemia Father   . Prostate cancer Father   . Depression Maternal Grandfather        prostate  . Depression Paternal Grandfather        committed suicide  . Hypothyroidism Other   . Diabetes Mellitus II Brother     Social History   Socioeconomic History  . Marital status: Married    Spouse name: Isaiah Cordova  . Number of children: 1  . Years of education: Not on file  . Highest education level: Not on file  Occupational History  . 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    Employer: [employer]  Social Needs  . Financial resource strain: Not on file   . Food insecurity    Worry: Not on file    Inability: Not on file  . Transportation needs    Medical: Not on file    Non-medical: Not on file  Tobacco Use  . Smoking status: Never Smoker  . Smokeless tobacco: Never Used  Substance and Sexual Activity  . Alcohol use: Yes    Alcohol/week: 2.0 standard drinks    Types: 2 Cans of beer per week  . Drug use: Not on file  . Sexual activity: Not on file  Lifestyle  . Physical activity    Days per week: Not on file    Minutes per session: Not on file  . Stress: Not on file  Relationships  . Social Herbalist on phone: Not on file    Gets together: Not on file    Attends religious service: Not on file    Active member of club or organization: Not on file    Attends meetings of clubs or organizations: Not on file    Relationship status: Not on file  . Intimate partner violence    Fear of current or ex partner: Not on file    Emotionally  abused: Not on file    Physically abused: Not on file    Forced sexual activity: Not on file  Other Topics Concern  . Not on file  Social History Narrative   Regular exercise - yes   1 daughter 53 years old    Outpatient Medications Prior to Visit  Medication Sig Dispense Refill  . Cyanocobalamin (B-12 PO) Take by mouth. 1 tab daily    . latanoprost (XALATAN) 0.005 % ophthalmic solution INT 1 GTT INTO OU HS  4  . Multiple Vitamins-Minerals (ONE-A-DAY MENS HEALTH FORMULA PO) Take 1 tablet by mouth daily.    . Omega-3 Fatty Acids (FISH OIL) 1000 MG CAPS Take 1,000 mg by mouth daily.    . prednisoLONE acetate (PRED FORTE) 1 % ophthalmic suspension   0  . testosterone cypionate (DEPOTESTOTERONE CYPIONATE) 100 MG/ML injection INJECT 1.5 ML IN THE MUSCLE EVERY 14 DAYS 10 mL 2  . timolol (TIMOPTIC) 0.25 % ophthalmic solution Place 1 drop into the left eye daily.      . traZODone (DESYREL) 50 MG tablet Take 0.5-1 tablets (25-50 mg total) by mouth at bedtime as needed for sleep. 30 tablet 2  .  FLUoxetine (PROZAC) 10 MG capsule Take 1 capsule (10 mg total) by mouth daily. For one week and then increase to 2 each morning. 60 capsule 1   No facility-administered medications prior to visit.     Allergies  Allergen Reactions  . Dust Mite Extract   . Lisdexamfetamine     REACTION: chest pains  . Other     GRASS  . Pollen Extract     ROS Review of Systems  Constitutional: Negative.   HENT: Negative.   Eyes: Negative for photophobia and visual disturbance.  Respiratory: Negative.   Cardiovascular: Negative.   Gastrointestinal: Negative.   Endocrine: Negative for polyphagia and polyuria.  Genitourinary: Negative.   Musculoskeletal: Negative for gait problem and joint swelling.  Skin: Negative for pallor and rash.  Allergic/Immunologic: Negative for immunocompromised state.  Neurological: Negative for light-headedness and numbness.  Hematological: Does not bruise/bleed easily.   Depression screen [facility] 2/9 02/07/2019 12/03/2018 07/27/2018  Decreased Interest 1 - 0  Down, Depressed, Hopeless 1 1 0  PHQ - 2 Score 2 1 0  Altered sleeping 2 0 -  Tired, decreased energy 1 3 -  Change in appetite 0 0 -  Feeling bad or failure about yourself  0 1 -  Trouble concentrating 1 2 -  Moving slowly or fidgety/restless 0 0 -  Suicidal thoughts 1 1 -  PHQ-9 Score 7 8 -      Objective:    Physical Exam  Constitutional: He is oriented to person, place, and time. He appears well-developed and well-nourished. No distress.  HENT:  Head: Normocephalic and atraumatic.  Right Ear: External ear normal.  Left Ear: External ear normal.  Mouth/Throat: Oropharynx is clear and moist. No oropharyngeal exudate.  Eyes: Pupils are equal, round, and reactive to light. Conjunctivae are normal. Right eye exhibits no discharge. Left eye exhibits no discharge. No scleral icterus.  Neck: Neck supple. No JVD present. No tracheal deviation present. No thyromegaly present.  Cardiovascular: Normal rate,  regular rhythm and normal heart sounds.  Pulmonary/Chest: Effort normal and breath sounds normal. No stridor.  Abdominal: Soft. Bowel sounds are normal. He exhibits no distension. There is no abdominal tenderness. There is no rebound and no guarding. Hernia confirmed negative in the right inguinal area and confirmed negative in the left  inguinal area.  Genitourinary:    Penis normal.  Right testis shows no mass, no swelling and no tenderness. Right testis is descended. Left testis shows no mass, no swelling and no tenderness. Left testis is descended. Circumcised. No hypospadias, penile erythema or penile tenderness. No discharge found.  Musculoskeletal:        General: No edema.  Lymphadenopathy:    He has no cervical adenopathy.       Right: No inguinal adenopathy present.       Left: No inguinal adenopathy present.  Neurological: He is alert and oriented to person, place, and time.  Skin: Skin is warm and dry. He is not diaphoretic.  Psychiatric: He has a normal mood and affect. His behavior is normal.    BP 130/80   Pulse 69   Ht 6' 1.25" (1.861 m)   Wt 204 lb (92.5 kg)   SpO2 97%   BMI 26.73 kg/m  Wt Readings from Last 3 Encounters:  02/07/19 204 lb (92.5 kg)  01/07/19 204 lb 6 oz (92.7 kg)  12/03/18 207 lb 2 oz (94 kg)   BP Readings from Last 3 Encounters:  02/07/19 130/80  01/07/19 128/80  12/03/18 126/80   Guideline developer:  UpToDate (see UpToDate for funding source) Date Released: June 2014  There are no preventive care reminders to display for this patient.  There are no preventive care reminders to display for this patient.  Lab Results  Component Value Date   TSH 1.41 01/18/2019   Lab Results  Component Value Date   WBC 6.2 01/18/2019   HGB 15.6 01/18/2019   HCT 46.8 01/18/2019   MCV 91.4 01/18/2019   PLT 255.0 01/18/2019   Lab Results  Component Value Date   NA 138 01/18/2019   K 4.4 01/18/2019   CO2 30 01/18/2019   GLUCOSE 101 (H) 01/18/2019    BUN 15 01/18/2019   CREATININE 0.98 01/18/2019   BILITOT 2.1 (H) 01/18/2019   ALKPHOS 66 01/18/2019   AST 18 01/18/2019   ALT 16 01/18/2019   PROT 7.0 01/18/2019   ALBUMIN 4.8 01/18/2019   CALCIUM 9.3 01/18/2019   ANIONGAP 14 06/21/2015   GFR 81.33 01/18/2019   Lab Results  Component Value Date   CHOL 221 (H) 01/18/2019   Lab Results  Component Value Date   HDL 46.90 01/18/2019   Lab Results  Component Value Date   LDLCALC 151 (H) 01/18/2019   Lab Results  Component Value Date   TRIG 115.0 01/18/2019   Lab Results  Component Value Date   CHOLHDL 5 01/18/2019   Lab Results  Component Value Date   HGBA1C 5.2 10/28/2011      Assessment & Plan:   Problem List Items Addressed This Visit      Endocrine   Androgen deficiency - Primary     Other   Healthcare maintenance   Elevated LDL cholesterol level   Depression with anxiety   Relevant Medications   FLUoxetine (PROZAC) 20 MG tablet      Meds ordered this encounter  Medications  . FLUoxetine (PROZAC) 20 MG tablet    Sig: Take 1 tablet (20 mg total) by mouth daily.    Dispense:  30 tablet    Refill:  3    Follow-up: No follow-ups on file.   Patient will continue Prozac at 20 mg daily.  Follow-up will be in 3 months or sooner as needed.  He was given information on lowering his LDL cholesterol.  Also given information on health maintenance and disease prevention.

## 2019-03-27 DIAGNOSIS — M546 Pain in thoracic spine: Secondary | ICD-10-CM | POA: Diagnosis not present

## 2019-03-27 DIAGNOSIS — H43391 Other vitreous opacities, right eye: Secondary | ICD-10-CM | POA: Diagnosis not present

## 2019-03-27 DIAGNOSIS — H43811 Vitreous degeneration, right eye: Secondary | ICD-10-CM | POA: Diagnosis not present

## 2019-03-27 DIAGNOSIS — M9901 Segmental and somatic dysfunction of cervical region: Secondary | ICD-10-CM | POA: Diagnosis not present

## 2019-03-27 DIAGNOSIS — M545 Low back pain: Secondary | ICD-10-CM | POA: Diagnosis not present

## 2019-03-27 DIAGNOSIS — H35373 Puckering of macula, bilateral: Secondary | ICD-10-CM | POA: Diagnosis not present

## 2019-03-27 DIAGNOSIS — M9902 Segmental and somatic dysfunction of thoracic region: Secondary | ICD-10-CM | POA: Diagnosis not present

## 2019-03-27 DIAGNOSIS — H0012 Chalazion right lower eyelid: Secondary | ICD-10-CM | POA: Diagnosis not present

## 2019-04-03 DIAGNOSIS — M546 Pain in thoracic spine: Secondary | ICD-10-CM | POA: Diagnosis not present

## 2019-04-03 DIAGNOSIS — M545 Low back pain: Secondary | ICD-10-CM | POA: Diagnosis not present

## 2019-04-03 DIAGNOSIS — M9902 Segmental and somatic dysfunction of thoracic region: Secondary | ICD-10-CM | POA: Diagnosis not present

## 2019-04-03 DIAGNOSIS — M9901 Segmental and somatic dysfunction of cervical region: Secondary | ICD-10-CM | POA: Diagnosis not present

## 2019-04-22 ENCOUNTER — Ambulatory Visit (HOSPITAL_COMMUNITY): Payer: BC Managed Care – PPO | Admitting: Certified Registered"

## 2019-04-22 ENCOUNTER — Encounter (HOSPITAL_COMMUNITY): Admission: RE | Disposition: A | Discharge status: Home / Self Care | Payer: Self-pay | Attending: Ophthalmology

## 2019-04-22 ENCOUNTER — Encounter (HOSPITAL_COMMUNITY): Payer: Self-pay | Admitting: *Deleted

## 2019-04-22 ENCOUNTER — Other Ambulatory Visit: Payer: Self-pay

## 2019-04-22 ENCOUNTER — Other Ambulatory Visit: Payer: Self-pay | Admitting: Ophthalmology

## 2019-04-22 ENCOUNTER — Ambulatory Visit (HOSPITAL_COMMUNITY)
Admission: RE | Admit: 2019-04-22 | Discharge: 2019-04-22 | Disposition: A | Discharge status: Home / Self Care | Payer: BC Managed Care – PPO | Source: Other Acute Inpatient Hospital | Attending: Ophthalmology | Admitting: Ophthalmology

## 2019-04-22 DIAGNOSIS — Z79899 Other long term (current) drug therapy: Secondary | ICD-10-CM | POA: Insufficient documentation

## 2019-04-22 DIAGNOSIS — H43811 Vitreous degeneration, right eye: Secondary | ICD-10-CM | POA: Diagnosis not present

## 2019-04-22 DIAGNOSIS — Z8719 Personal history of other diseases of the digestive system: Secondary | ICD-10-CM | POA: Diagnosis not present

## 2019-04-22 DIAGNOSIS — J40 Bronchitis, not specified as acute or chronic: Secondary | ICD-10-CM | POA: Diagnosis not present

## 2019-04-22 DIAGNOSIS — F329 Major depressive disorder, single episode, unspecified: Secondary | ICD-10-CM | POA: Insufficient documentation

## 2019-04-22 DIAGNOSIS — Z888 Allergy status to other drugs, medicaments and biological substances status: Secondary | ICD-10-CM | POA: Insufficient documentation

## 2019-04-22 DIAGNOSIS — K219 Gastro-esophageal reflux disease without esophagitis: Secondary | ICD-10-CM | POA: Diagnosis not present

## 2019-04-22 DIAGNOSIS — F418 Other specified anxiety disorders: Secondary | ICD-10-CM | POA: Diagnosis not present

## 2019-04-22 DIAGNOSIS — Z20828 Contact with and (suspected) exposure to other viral communicable diseases: Secondary | ICD-10-CM | POA: Insufficient documentation

## 2019-04-22 DIAGNOSIS — H43391 Other vitreous opacities, right eye: Secondary | ICD-10-CM | POA: Diagnosis not present

## 2019-04-22 DIAGNOSIS — H33021 Retinal detachment with multiple breaks, right eye: Secondary | ICD-10-CM | POA: Diagnosis not present

## 2019-04-22 DIAGNOSIS — F419 Anxiety disorder, unspecified: Secondary | ICD-10-CM | POA: Insufficient documentation

## 2019-04-22 DIAGNOSIS — H33011 Retinal detachment with single break, right eye: Secondary | ICD-10-CM | POA: Diagnosis not present

## 2019-04-22 DIAGNOSIS — H35373 Puckering of macula, bilateral: Secondary | ICD-10-CM | POA: Diagnosis not present

## 2019-04-22 DIAGNOSIS — H3321 Serous retinal detachment, right eye: Secondary | ICD-10-CM | POA: Diagnosis not present

## 2019-04-22 HISTORY — PX: PHOTOCOAGULATION WITH LASER: SHX6027

## 2019-04-22 HISTORY — DX: Pure hypercholesterolemia, unspecified: E78.00

## 2019-04-22 HISTORY — PX: PARS PLANA VITRECTOMY: SHX2166

## 2019-04-22 HISTORY — PX: GAS INSERTION: SHX5336

## 2019-04-22 HISTORY — PX: GAS/FLUID EXCHANGE: SHX5334

## 2019-04-22 LAB — SARS CORONAVIRUS 2 BY RT PCR (HOSPITAL ORDER, PERFORMED IN ~~LOC~~ HOSPITAL LAB): SARS Coronavirus 2: NEGATIVE

## 2019-04-22 SURGERY — PARS PLANA VITRECTOMY WITH 25 GAUGE
Anesthesia: General | Site: Eye | Laterality: Right

## 2019-04-22 MED ORDER — FENTANYL CITRATE (PF) 250 MCG/5ML IJ SOLN
INTRAMUSCULAR | Status: AC
  Filled 2019-04-22: qty 5

## 2019-04-22 MED ORDER — LIDOCAINE 2% (20 MG/ML) 5 ML SYRINGE
INTRAMUSCULAR | Status: DC | PRN
  Administered 2019-04-22: 100 mg via INTRAVENOUS

## 2019-04-22 MED ORDER — PHENYLEPHRINE HCL-NACL 10-0.9 MG/250ML-% IV SOLN
INTRAVENOUS | Status: DC | PRN
  Administered 2019-04-22: 30 ug/min via INTRAVENOUS

## 2019-04-22 MED ORDER — LACTATED RINGERS IV SOLN: INTRAVENOUS | Status: DC

## 2019-04-22 MED ORDER — SUGAMMADEX SODIUM 200 MG/2ML IV SOLN
INTRAVENOUS | Status: DC | PRN
  Administered 2019-04-22: 200 mg via INTRAVENOUS

## 2019-04-22 MED ORDER — CEFAZOLIN SUBCONJUNCTIVAL INJECTION 100 MG/0.5 ML
100.0000 mg | INJECTION | SUBCONJUNCTIVAL | Status: AC
  Administered 2019-04-22: 100 mg via SUBCONJUNCTIVAL
  Filled 2019-04-22: qty 1

## 2019-04-22 MED ORDER — FENTANYL CITRATE (PF) 250 MCG/5ML IJ SOLN
INTRAMUSCULAR | Status: DC | PRN
  Administered 2019-04-22: 100 ug via INTRAVENOUS

## 2019-04-22 MED ORDER — ATROPINE SULFATE 1 % OP SOLN
1.0000 [drp] | OPHTHALMIC | Status: AC
  Administered 2019-04-22 (×3): 1 [drp] via OPHTHALMIC

## 2019-04-22 MED ORDER — ONDANSETRON HCL 4 MG/2ML IJ SOLN
INTRAMUSCULAR | Status: DC | PRN
  Administered 2019-04-22: 4 mg via INTRAVENOUS

## 2019-04-22 MED ORDER — PROPOFOL 10 MG/ML IV BOLUS
INTRAVENOUS | Status: DC | PRN
  Administered 2019-04-22: 160 mg via INTRAVENOUS

## 2019-04-22 MED ORDER — ONDANSETRON HCL 4 MG/2ML IJ SOLN
INTRAMUSCULAR | Status: AC
  Filled 2019-04-22: qty 2

## 2019-04-22 MED ORDER — EPINEPHRINE PF 1 MG/ML IJ SOLN
INTRAOCULAR | Status: DC | PRN
  Administered 2019-04-22: 500 mL

## 2019-04-22 MED ORDER — PROPOFOL 10 MG/ML IV BOLUS
INTRAVENOUS | Status: AC
  Filled 2019-04-22: qty 20

## 2019-04-22 MED ORDER — LIDOCAINE HCL 2 % IJ SOLN
INTRAMUSCULAR | Status: DC | PRN
  Administered 2019-04-22: 5 mL via OPHTHALMIC

## 2019-04-22 MED ORDER — BALANCED SALT IO SOLN
INTRAOCULAR | Status: DC | PRN
  Administered 2019-04-22: 15 mL via INTRAOCULAR

## 2019-04-22 MED ORDER — HYPROMELLOSE (GONIOSCOPIC) 2.5 % OP SOLN
OPHTHALMIC | Status: DC | PRN
  Administered 2019-04-22: 1 [drp] via OPHTHALMIC

## 2019-04-22 MED ORDER — PHENYLEPHRINE HCL 2.5 % OP SOLN
1.0000 [drp] | OPHTHALMIC | Status: AC
  Administered 2019-04-22 (×3): 1 [drp] via OPHTHALMIC

## 2019-04-22 MED ORDER — MIDAZOLAM HCL 5 MG/5ML IJ SOLN
INTRAMUSCULAR | Status: DC | PRN
  Administered 2019-04-22 (×2): 1 mg via INTRAVENOUS

## 2019-04-22 MED ORDER — ATROPINE SULFATE 1 % OP SOLN
OPHTHALMIC | Status: DC | PRN
  Administered 2019-04-22: 1 [drp]

## 2019-04-22 MED ORDER — LIDOCAINE 2% (20 MG/ML) 5 ML SYRINGE
INTRAMUSCULAR | Status: AC
  Filled 2019-04-22: qty 5

## 2019-04-22 MED ORDER — DEXAMETHASONE SODIUM PHOSPHATE 10 MG/ML IJ SOLN
INTRAMUSCULAR | Status: DC | PRN
  Administered 2019-04-22: 5 mg via INTRAVENOUS

## 2019-04-22 MED ORDER — ATROPINE SULFATE 1 % OP SOLN
OPHTHALMIC | Status: AC
  Administered 2019-04-22: 16:00:00 1 [drp] via OPHTHALMIC
  Filled 2019-04-22: qty 5

## 2019-04-22 MED ORDER — TOBRAMYCIN-DEXAMETHASONE 0.3-0.1 % OP OINT
TOPICAL_OINTMENT | OPHTHALMIC | Status: DC | PRN
  Administered 2019-04-22: 1 via OPHTHALMIC

## 2019-04-22 MED ORDER — MIDAZOLAM HCL 2 MG/2ML IJ SOLN
INTRAMUSCULAR | Status: AC
  Filled 2019-04-22: qty 2

## 2019-04-22 MED ORDER — DEXAMETHASONE SODIUM PHOSPHATE 10 MG/ML IJ SOLN
INTRAMUSCULAR | Status: DC | PRN
  Administered 2019-04-22: 10 mg

## 2019-04-22 MED ORDER — ROCURONIUM BROMIDE 100 MG/10ML IV SOLN
INTRAVENOUS | Status: DC | PRN
  Administered 2019-04-22: 50 mg via INTRAVENOUS

## 2019-04-22 MED ORDER — PHENYLEPHRINE HCL 2.5 % OP SOLN
OPHTHALMIC | Status: AC
  Administered 2019-04-22: 1 [drp] via OPHTHALMIC
  Filled 2019-04-22: qty 2

## 2019-04-22 MED ORDER — FENTANYL CITRATE (PF) 100 MCG/2ML IJ SOLN: 25.0000 ug | INTRAMUSCULAR | Status: DC | PRN

## 2019-04-22 MED ORDER — DEXAMETHASONE SODIUM PHOSPHATE 10 MG/ML IJ SOLN
INTRAMUSCULAR | Status: AC
  Filled 2019-04-22: qty 1

## 2019-04-22 MED ORDER — SODIUM CHLORIDE 0.9 % IV SOLN
INTRAVENOUS | Status: DC
  Administered 2019-04-22: 16:00:00 via INTRAVENOUS

## 2019-04-22 SURGICAL SUPPLY — 62 items
BAND WRIST GAS GREEN (MISCELLANEOUS) IMPLANT
BLADE MINI 60D BLUE (BLADE) ×1 IMPLANT
CABLE BIPOLOR RESECTION CORD (MISCELLANEOUS) ×1 IMPLANT
CANNULA VLV SOFT TIP 25G (OPHTHALMIC) ×1 IMPLANT
CANNULA VLV SOFT TIP 25GA (OPHTHALMIC) ×2 IMPLANT
CAUTERY EYE LOW TEMP 1300F FIN (OPHTHALMIC RELATED) ×1 IMPLANT
CLSR STERI-STRIP ANTIMIC 1/2X4 (GAUZE/BANDAGES/DRESSINGS) ×2 IMPLANT
COVER MAYO STAND STRL (DRAPES) IMPLANT
COVER WAND RF STERILE (DRAPES) ×2 IMPLANT
DRAPE HALF SHEET 40X57 (DRAPES) ×2 IMPLANT
DRAPE INCISE 51X51 W/FILM STRL (DRAPES) IMPLANT
DRAPE RETRACTOR (MISCELLANEOUS) ×2 IMPLANT
ERASER HMR WETFIELD 23G BP (MISCELLANEOUS) IMPLANT
FILTER BLUE MILLIPORE (MISCELLANEOUS) IMPLANT
FORCEPS ECKARDT ILM 25G SERR (OPHTHALMIC RELATED) IMPLANT
FORCEPS GRIESHABER ILM 25G A (INSTRUMENTS) IMPLANT
GAS AUTO FILL CONSTEL (OPHTHALMIC) ×2
GAS AUTO FILL CONSTELLATION (OPHTHALMIC) IMPLANT
GAS IO PFP 125GM ISPAN CNSTL (MISCELLANEOUS) IMPLANT
GAS TANK CONSTELL (MISCELLANEOUS) ×1
GAS WRIST BAND GREEN (MISCELLANEOUS)
GLOVE BIO SURGEON STRL SZ7.5 (GLOVE) ×2 IMPLANT
GOWN STRL REUS W/ TWL LRG LVL3 (GOWN DISPOSABLE) ×2 IMPLANT
GOWN STRL REUS W/TWL LRG LVL3 (GOWN DISPOSABLE) ×2
HANDLE PNEUMATIC FOR CONSTEL (OPHTHALMIC) IMPLANT
KIT BASIN OR (CUSTOM PROCEDURE TRAY) ×2 IMPLANT
KIT TURNOVER KIT B (KITS) IMPLANT
LENS BIOM SUPER VIEW SET DISP (MISCELLANEOUS) ×2 IMPLANT
MICROPICK 25G (MISCELLANEOUS)
NDL 18GX1X1/2 (RX/OR ONLY) (NEEDLE) ×1 IMPLANT
NDL 25GX 5/8IN NON SAFETY (NEEDLE) ×1 IMPLANT
NDL FILTER BLUNT 18X1 1/2 (NEEDLE) IMPLANT
NDL HYPO 25GX1X1/2 BEV (NEEDLE) IMPLANT
NDL HYPO 30X.5 LL (NEEDLE) ×1 IMPLANT
NDL RETROBULBAR 25GX1.5 (NEEDLE) ×1 IMPLANT
NEEDLE 18GX1X1/2 (RX/OR ONLY) (NEEDLE) ×2 IMPLANT
NEEDLE 25GX 5/8IN NON SAFETY (NEEDLE) ×2 IMPLANT
NEEDLE FILTER BLUNT 18X 1/2SAF (NEEDLE)
NEEDLE FILTER BLUNT 18X1 1/2 (NEEDLE) IMPLANT
NEEDLE HYPO 25GX1X1/2 BEV (NEEDLE) IMPLANT
NEEDLE HYPO 30X.5 LL (NEEDLE) ×2 IMPLANT
NEEDLE RETROBULBAR 25GX1.5 (NEEDLE) ×2 IMPLANT
NS IRRIG 1000ML POUR BTL (IV SOLUTION) ×2 IMPLANT
PAD ARMBOARD 7.5X6 YLW CONV (MISCELLANEOUS) ×4 IMPLANT
PAK PIK VITRECTOMY CVS 25GA (OPHTHALMIC) ×4 IMPLANT
PENCIL BIPOLAR 25GA STR DISP (OPHTHALMIC RELATED) IMPLANT
PICK MICROPICK 25G (MISCELLANEOUS) IMPLANT
PROBE LASER ILLUM FLEX CVD 25G (OPHTHALMIC) ×1 IMPLANT
ROLLS DENTAL (MISCELLANEOUS) IMPLANT
SCRAPER DIAMOND 25GA (OPHTHALMIC RELATED) IMPLANT
STOCKINETTE IMPERVIOUS 9X36 MD (GAUZE/BANDAGES/DRESSINGS) ×4 IMPLANT
STOPCOCK 4 WAY LG BORE MALE ST (IV SETS) IMPLANT
SUT ETHILON 8 0 TG100 8 (SUTURE) IMPLANT
SUT VICRYL 7 0 TG140 8 (SUTURE) IMPLANT
SUT VICRYL 8 0 TG140 8 (SUTURE) IMPLANT
SUT VICRYL ABS 6-0 S29 18IN (SUTURE) IMPLANT
SYR 10ML LL (SYRINGE) IMPLANT
SYR 20ML LL LF (SYRINGE) ×2 IMPLANT
SYR 5ML LL (SYRINGE) ×2 IMPLANT
SYR TB 1ML LUER SLIP (SYRINGE) IMPLANT
TUBE CONNECTING 12X1/4 (SUCTIONS) IMPLANT
WATER STERILE IRR 1000ML POUR (IV SOLUTION) ×2 IMPLANT

## 2019-04-22 NOTE — Anesthesia Preprocedure Evaluation (Addendum)
Anesthesia Evaluation  Patient identified by MRN, date of birth, ID band  Reviewed: Allergy & Precautions, H&P , NPO status , Patient's Chart, lab work & pertinent test results  Airway Mallampati: II  TM Distance: >3 FB Neck ROM: Full    Dental  (+) Dental Advisory Given   Pulmonary neg pulmonary ROS,    Pulmonary exam normal breath sounds clear to auscultation       Cardiovascular negative cardio ROS Normal cardiovascular exam Rhythm:Regular Rate:Normal     Neuro/Psych  Headaches, PSYCHIATRIC DISORDERS Anxiety Depression    GI/Hepatic Neg liver ROS, PUD, GERD  Controlled,  Endo/Other  negative endocrine ROS  Renal/GU negative Renal ROS  negative genitourinary   Musculoskeletal   Abdominal   Peds  Hematology negative hematology ROS (+)   Anesthesia Other Findings   Reproductive/Obstetrics negative OB ROS                            Anesthesia Physical Anesthesia Plan  ASA: II  Anesthesia Plan: General   Post-op Pain Management:    Induction: Intravenous  PONV Risk Score and Plan: 3 and Ondansetron, Dexamethasone and Midazolam  Airway Management Planned: Oral ETT  Additional Equipment: None  Intra-op Plan:   Post-operative Plan: Extubation in OR  Informed Consent: I have reviewed the patients History and Physical, chart, labs and discussed the procedure including the risks, benefits and alternatives for the proposed anesthesia with the patient or authorized representative who has indicated his/her understanding and acceptance.     Dental advisory given  Plan Discussed with: CRNA  Anesthesia Plan Comments:        Anesthesia Quick Evaluation

## 2019-04-22 NOTE — Op Note (Signed)
NAME: Isaiah Cordova, Isaiah Cordova MEDICAL RECORD [id number] ACCOUNT [id number] DATE OF BIRTH:1970/09/19 FACILITY: [facility] LOCATION: [place], [place]  OPERATIVE REPORT  DATE OF PROCEDURE:  04/22/2019  PREOPERATIVE DIAGNOSIS:  Retinal detachment, right eye.  POSTOPERATIVE DIAGNOSIS:  Retinal detachment, right eye.  SURGEON:  Sherlynn Stalls, MD   ASSISTANTS:  None.  ANESTHESIA:  General with endotracheal intubation.  COMPLICATIONS:  None.  OPERATIVE PROCEDURE:  A 25-gauge pars plana vitrectomy with endocautery endolaser and repair of retinal detachment of the right eye.  DESCRIPTION OF PROCEDURE:  The patient was identified in the preoperative holding area.  A signed consent form was placed on the chart.  He was taken to the operating room where he was placed under general anesthesia by the anesthesia team.  At that  point in time, the right eye was anesthetized using a retrobulbar block consisting of a 1:1 mixture of 0.75% bupivacaine and 1% lidocaine with 150 units of Hylenex.  After an excellent block was placed, the needle was removed.  The right eye was prepped  and draped in the usual sterile fashion for ocular surgery at that time.  Next, a Lieberman speculum was placed between the right upper and lower eyelids for exposure.  The 25-gauge trocars were used to introduce transconjunctival cannulas in the  inferotemporal, superotemporal and superonasal quadrants.  These trocars were removed, leaving the cannulas in place.  The eye then underwent a core vitrectomy with the vitreous cutter and light pipe.  Unfortunately, the corneal epithelium quickly became  edematous during the early phase of the procedure.  This required removal of the corneal epithelium using a #69 blade.  This greatly enhanced the view of the retina.  The epithelium was discarded without difficulty.  Once the view was restored, the  vitrectomy was carried out to the periphery.  The eye was then  inspected by scleral depression.  Small horseshoe tears were located in the superonasal quadrant.  These were marked using endolaser.  Once this was completed, the eye underwent an air fluid  exchange.  A small posterior retinotomy was necessary due to the anterior locations of the break.  Therefore, endocautery probe was used to create a small posterior retinotomy outside the macula.  A soft tip extrusion cannula was then used to perform an  air fluid exchange, first removing all the subretinal fluid and then the remaining intraocular fluid.  This allowed for the retina to flatten completely.  Once this was completed, laser was placed along the superonasal and nasal periphery.  Confluent  laser was placed around the small horseshoe tears.  Confluent laser was also placed around the small retinotomy.  At this point in time, the retina looked excellent.  The eye was then flushed with a 14% concentration of C3F8 gas.  Next, the cannulas were  removed from the sclerotomies.  Sclerotomies were inspected and found to be watertight.  The eye was then treated with subconjunctival injections of ____  mg of Ancef and 1 mg of dexamethasone.  The eye was treated topically with one drop of 1% atropine  and TobraDex ointment.  The speculum was removed.  The eye was cleaned and closed and then patched and shielded.  The patient was then taken to recovery in excellent condition, having tolerated the procedure very well.  VN/NUANCE  D:04/22/2019 T:04/22/2019 JOB:[id number]

## 2019-04-22 NOTE — H&P (Signed)
Isaiah Cordova is an 48 y.o. male.   Chief Complaint: vision loss OD  HPI: dx with Rd OD. H/o SBP OD 11 years prior.  Past Medical History:  Diagnosis Date  . Anxiety   . Concussion   . Depression   . Gastric ulcer   . GERD (gastroesophageal reflux disease)   . Headache(784.0)    chronic  . Hemorrhoids, external   . Hypercholesteremia   . Irritable bowel   . Neuromuscular disorder (HCC)    RLS  . Rectal bleeding    history of, negative colonoscopy 2005-2006    Past Surgical History:  Procedure Laterality Date  . CATARACT EXTRACTION, BILATERAL    . COLONOSCOPY    . HERNIA REPAIR    . NASAL SEPTUM SURGERY    . RETINAL DETACHMENT SURGERY      Family History  Problem Relation Age of Onset  . Hypertension Father   . Diabetes Father   . Hyperlipidemia Father   . Prostate cancer Father   . Depression Maternal Grandfather        prostate  . Depression Paternal Grandfather        committed suicide  . Hypothyroidism Other   . Diabetes Mellitus II Brother    Social History:  reports that he has never smoked. He has never used smokeless tobacco. He reports current alcohol use of about 2.0 standard drinks of alcohol per week. He reports that he does not use drugs.  Allergies:  Allergies  Allergen Reactions  . Dust Mite Extract Other (See Comments)    sneezing  . Lisdexamfetamine Other (See Comments)    REACTION: chest pains  . Other Other (See Comments)    GRASS causes sneezing  . Pollen Extract Other (See Comments)    sneezing    Medications Prior to Admission  Medication Sig Dispense Refill  . Cyanocobalamin (B-12 PO) Take 1 tablet by mouth every morning.     Marland Kitchen FLUoxetine (PROZAC) 20 MG tablet Take 1 tablet (20 mg total) by mouth daily. 30 tablet 3  . Omega-3 Fatty Acids (FISH OIL PO) Take 1 capsule by mouth every morning.     . testosterone cypionate (DEPOTESTOTERONE CYPIONATE) 100 MG/ML injection INJECT 1.5 ML IN THE MUSCLE EVERY 14 DAYS (Patient taking  differently: Inject 150 mg into the muscle every 14 (fourteen) days. ) 10 mL 2  . timolol (TIMOPTIC) 0.25 % ophthalmic solution Place 1 drop into both eyes every morning.     . traZODone (DESYREL) 50 MG tablet Take 0.5-1 tablets (25-50 mg total) by mouth at bedtime as needed for sleep. (Patient not taking: Reported on 04/22/2019) 30 tablet 2    Results for orders placed or performed during the hospital encounter of 04/22/19 (from the past 48 hour(s))  SARS Coronavirus 2 by RT PCR (hospital order, performed in [facility] hospital lab) Nasopharyngeal Nasopharyngeal Swab     Status: None   Collection Time: 04/22/19  3:10 PM   Specimen: Nasopharyngeal Swab  Result Value Ref Range   SARS Coronavirus 2 NEGATIVE NEGATIVE    Comment: (NOTE) If result is NEGATIVE SARS-CoV-2 target nucleic acids are NOT DETECTED. The SARS-CoV-2 RNA is generally detectable in upper and lower  respiratory specimens during the acute phase of infection. The lowest  concentration of SARS-CoV-2 viral copies this assay can detect is 250  copies / mL. A negative result does not preclude SARS-CoV-2 infection  and should not be used as the sole basis for treatment or other  patient management decisions.  A negative result may occur with  improper specimen collection / handling, submission of specimen other  than nasopharyngeal swab, presence of viral mutation(s) within the  areas targeted by this assay, and inadequate number of viral copies  (<250 copies / mL). A negative result must be combined with clinical  observations, patient history, and epidemiological information. If result is POSITIVE SARS-CoV-2 target nucleic acids are DETECTED. The SARS-CoV-2 RNA is generally detectable in upper and lower  respiratory specimens dur ing the acute phase of infection.  Positive  results are indicative of active infection with SARS-CoV-2.  Clinical  correlation with patient history and other diagnostic information is  necessary  to determine patient infection status.  Positive results do  not rule out bacterial infection or co-infection with other viruses. If result is PRESUMPTIVE POSTIVE SARS-CoV-2 nucleic acids MAY BE PRESENT.   A presumptive positive result was obtained on the submitted specimen  and confirmed on repeat testing.  While 2019 novel coronavirus  (SARS-CoV-2) nucleic acids may be present in the submitted sample  additional confirmatory testing may be necessary for epidemiological  and / or clinical management purposes  to differentiate between  SARS-CoV-2 and other Sarbecovirus currently known to infect humans.  If clinically indicated additional testing with an alternate test  methodology [phone]) is advised. The SARS-CoV-2 RNA is generally  detectable in upper and lower respiratory sp ecimens during the acute  phase of infection. The expected result is Negative. Fact Sheet for Patients:  StrictlyIdeas.no Fact Sheet for Healthcare Providers: BankingDealers.co.za This test is not yet approved or cleared by the [place] FDA and has been authorized for detection and/or diagnosis of SARS-CoV-2 by FDA under an Emergency Use Authorization (EUA).  This EUA will remain in effect (meaning this test can be used) for the duration of the COVID-19 declaration under Section 564(b)(1) of the Act, 21 U.S.C. section 360bbb-3(b)(1), unless the authorization is terminated or revoked sooner. Performed at [facility], [place] [address] [place], [place] [address]    No results found.  Review of Systems  Eyes: Positive for blurred vision.  All other systems reviewed and are negative.   Blood pressure (!) 150/85, pulse 72, temperature 98.1 F (36.7 C), temperature source Oral, resp. rate 20, height _0  (1.88 m), weight 90.7 kg, SpO2 99 %. Physical Exam  Constitutional: He appears well-developed and well-nourished.  Eyes: Pupils are equal, round, and  reactive to light. Conjunctivae, EOM and lids are normal.       Assessment/Plan 1. Mac on RRD OD: PPV/EC/EL/GFX OD  Corliss Parish, MD 04/22/2019, 4:42 PM

## 2019-04-22 NOTE — Anesthesia Postprocedure Evaluation (Signed)
Anesthesia Post Note  Patient: Isaiah Cordova  Procedure(s) Performed: PARS PLANA VITRECTOMY WITH 25 GAUGE (Right Eye) Photocoagulation With Laser (Right Eye) Gas/Fluid Exchange (Right Eye) Insertion Of Gas (Right Eye)     Patient location during evaluation: PACU Anesthesia Type: General Level of consciousness: awake and alert Pain management: pain level controlled Vital Signs Assessment: post-procedure vital signs reviewed and stable Respiratory status: spontaneous breathing, nonlabored ventilation and respiratory function stable Cardiovascular status: blood pressure returned to baseline and stable Postop Assessment: no apparent nausea or vomiting Anesthetic complications: no    Last Vitals:  Vitals:   04/22/19 1840 04/22/19 1855  BP: 125/80 122/85  Pulse: 67 70  Resp: 17 13  Temp:  36.8 C  SpO2: 98% 100%    Last Pain:  Vitals:   04/22/19 1855  TempSrc:   PainSc: 0-No pain                 Catalina Gravel

## 2019-04-22 NOTE — Brief Op Note (Signed)
04/22/2019  6:25 PM  PATIENT:  Isaiah Cordova  48 y.o. male  PRE-OPERATIVE DIAGNOSIS:  retinal detachment  POST-OPERATIVE DIAGNOSIS:  retinal detachment  PROCEDURE:  Procedure(s): PARS PLANA VITRECTOMY WITH 25 GAUGE (Right) Photocoagulation With Laser (Right) Gas/Fluid Exchange (Right) Insertion Of Gas (Right)  SURGEON:  Surgeon(s) and Role:    Sherlynn Stalls, MD - Primary  PHYSICIAN ASSISTANT:   ASSISTANTS: none   ANESTHESIA:   general  EBL:  Min.  BLOOD ADMINISTERED:none  DRAINS: none   LOCAL MEDICATIONS USED:  BUPIVICAINE   SPECIMEN:  No Specimen  DISPOSITION OF SPECIMEN:  N/A  COUNTS:  YES  TOURNIQUET:  * No tourniquets in log *  DICTATION: .Other Dictation: Dictation Number [id number]  PLAN OF CARE: Discharge to home after PACU  PATIENT DISPOSITION:  PACU - hemodynamically stable.   Delay start of Pharmacological VTE agent (>24hrs) due to surgical blood loss or risk of bleeding: not applicable

## 2019-04-22 NOTE — Anesthesia Procedure Notes (Signed)
Procedure Name: Intubation Date/Time: 04/22/2019 5:07 PM Performed by: Janene Harvey, CRNA Pre-anesthesia Checklist: Patient identified, Emergency Drugs available, Suction available and Patient being monitored Patient Re-evaluated:Patient Re-evaluated prior to induction Oxygen Delivery Method: Circle system utilized Preoxygenation: Pre-oxygenation with 100% oxygen Induction Type: IV induction Ventilation: Mask ventilation without difficulty Laryngoscope Size: Mac and 4 Grade View: Grade I Tube type: Oral Tube size: 7.5 mm Number of attempts: 1 Airway Equipment and Method: Stylet and Oral airway Placement Confirmation: ETT inserted through vocal cords under direct vision,  positive ETCO2 and breath sounds checked- equal and bilateral Secured at: 23 cm Tube secured with: Tape Dental Injury: Teeth and Oropharynx as per pre-operative assessment

## 2019-04-22 NOTE — Transfer of Care (Signed)
Immediate Anesthesia Transfer of Care Note  Patient: Isaiah Cordova  Procedure(s) Performed: PARS PLANA VITRECTOMY WITH 25 GAUGE (Right Eye) Photocoagulation With Laser (Right Eye) Gas/Fluid Exchange (Right Eye) Insertion Of Gas (Right Eye)  Patient Location: PACU  Anesthesia Type:General  Level of Consciousness: drowsy  Airway & Oxygen Therapy: Patient Spontanous Breathing and Patient connected to face mask oxygen  Post-op Assessment: Report given to RN and Post -op Vital signs reviewed and stable  Post vital signs: Reviewed  Last Vitals:  Vitals Value Taken Time  BP 122/78 04/22/19 1824  Temp    Pulse 68 04/22/19 1826  Resp 17 04/22/19 1826  SpO2 100 % 04/22/19 1826  Vitals shown include unvalidated device data.  Last Pain:  Vitals:   04/22/19 1554  TempSrc:   PainSc: 0-No pain      Patients Stated Pain Goal: 3 ([id number])  Complications: No apparent anesthesia complications

## 2019-04-23 ENCOUNTER — Encounter (HOSPITAL_COMMUNITY): Payer: Self-pay | Admitting: Ophthalmology

## 2019-04-23 DIAGNOSIS — H33011 Retinal detachment with single break, right eye: Secondary | ICD-10-CM | POA: Diagnosis not present

## 2019-05-28 DIAGNOSIS — H31091 Other chorioretinal scars, right eye: Secondary | ICD-10-CM | POA: Diagnosis not present

## 2019-05-28 DIAGNOSIS — H34832 Tributary (branch) retinal vein occlusion, left eye, with macular edema: Secondary | ICD-10-CM | POA: Diagnosis not present

## 2019-05-28 DIAGNOSIS — H35372 Puckering of macula, left eye: Secondary | ICD-10-CM | POA: Diagnosis not present

## 2019-06-03 ENCOUNTER — Other Ambulatory Visit: Payer: Self-pay

## 2019-06-03 ENCOUNTER — Ambulatory Visit: Payer: BC Managed Care – PPO | Admitting: Family Medicine

## 2019-06-03 ENCOUNTER — Encounter: Payer: Self-pay | Admitting: Family Medicine

## 2019-06-03 VITALS — BP 110/70 | HR 71 | Temp 98.4°F | Ht 74.0 in | Wt 210.0 lb

## 2019-06-03 DIAGNOSIS — F418 Other specified anxiety disorders: Secondary | ICD-10-CM

## 2019-06-03 DIAGNOSIS — Z23 Encounter for immunization: Secondary | ICD-10-CM | POA: Diagnosis not present

## 2019-06-03 DIAGNOSIS — Z0000 Encounter for general adult medical examination without abnormal findings: Secondary | ICD-10-CM | POA: Diagnosis not present

## 2019-06-03 DIAGNOSIS — E291 Testicular hypofunction: Secondary | ICD-10-CM | POA: Diagnosis not present

## 2019-06-03 LAB — CBC
HCT: 43.1 % (ref 39.0–52.0)
Hemoglobin: 14.4 g/dL (ref 13.0–17.0)
MCHC: 33.4 g/dL (ref 30.0–36.0)
MCV: 90.7 fl (ref 78.0–100.0)
Platelets: 251 10*3/uL (ref 150.0–400.0)
RBC: 4.75 Mil/uL (ref 4.22–5.81)
RDW: 13.2 % (ref 11.5–15.5)
WBC: 6.4 10*3/uL (ref 4.0–10.5)

## 2019-06-03 LAB — TESTOSTERONE: Testosterone: 217.51 ng/dL — ABNORMAL LOW (ref 300.00–890.00)

## 2019-06-03 MED ORDER — FLUOXETINE HCL 20 MG PO TABS: 20.0000 mg | ORAL_TABLET | Freq: Every day | ORAL | 1 refills | Status: DC

## 2019-06-03 NOTE — Progress Notes (Signed)
Established Patient Office Visit  Subjective:  Patient ID: Isaiah Cordova, male    DOB: Jan 03, 1971  Age: 49 y.o. MRN: [id number]  CC:  Chief Complaint  Patient presents with  . Follow-up    3 month follow up on prozac, no concerns at this time     HPI Isaiah Cordova presents for follow-up of his depression with anxiety and testosterone deficiency.  Has been doing relatively well with the Prozac.  Things have leveled out for him emotionally.  He has noticed some increase in energy despite the fact that he has not had his testosterone injections in several months.  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  Recovering well from this.  Has not been exercising because he was advised to avoid strenuous activity to but he because of his recent detachment.  Past Medical History:  Diagnosis Date  . Anxiety   . Concussion   . Depression   . Gastric ulcer   . GERD (gastroesophageal reflux disease)   . Headache(784.0)    chronic  . Hemorrhoids, external   . Hypercholesteremia   . Irritable bowel   . Neuromuscular disorder (HCC)    RLS  . Rectal bleeding    history of, negative colonoscopy 2005-2006    Past Surgical History:  Procedure Laterality Date  . CATARACT EXTRACTION, BILATERAL    . COLONOSCOPY    . GAS INSERTION Right 04/22/2019   Procedure: Insertion Of Gas;  Surgeon: Sherlynn Stalls, MD;  Location: [place];  Service: Ophthalmology;  Laterality: Right;  . GAS/FLUID EXCHANGE Right 04/22/2019   Procedure: Gas/Fluid Exchange;  Surgeon: Sherlynn Stalls, MD;  Location: [place];  Service: Ophthalmology;  Laterality: Right;  . HERNIA REPAIR    . NASAL SEPTUM SURGERY    . PARS PLANA VITRECTOMY Right 04/22/2019   Procedure: PARS PLANA VITRECTOMY WITH 25 GAUGE;  Surgeon: Sherlynn Stalls, MD;  Location: [place];  Service: Ophthalmology;  Laterality: Right;  . PHOTOCOAGULATION WITH LASER Right 04/22/2019   Procedure:  Photocoagulation With Laser;  Surgeon: Sherlynn Stalls, MD;  Location: [place];  Service: Ophthalmology;  Laterality: Right;  . RETINAL DETACHMENT SURGERY      Family History  Problem Relation Age of Onset  . Hypertension Father   . Diabetes Father   . Hyperlipidemia Father   . Prostate cancer Father   . Depression Maternal Grandfather        prostate  . Depression Paternal Grandfather        committed suicide  . Hypothyroidism Other   . Diabetes Mellitus II Brother     Social History   Socioeconomic History  . Marital status: Married    Spouse name: [name]  . Number of children: 1  . Years of education: Not on file  . Highest education level: Not on file  Occupational History  . 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    Employer: [employer]  Tobacco Use  . Smoking status: Never Smoker  . Smokeless tobacco: Never Used  Substance and Sexual Activity  . Alcohol use: Yes    Alcohol/week: 2.0 standard drinks    Types: 2 Cans of beer per week  . Drug use: Never  . Sexual activity: Not on file  Other Topics Concern  . Not on file  Social History Narrative   Regular exercise - yes   1 daughter 44 years old   Social Determinants of Health  Financial Resource Strain:   . Difficulty of Paying Living Expenses: Not on file  Food Insecurity:   . Worried About Charity fundraiser in the Last Year: Not on file  . Ran Out of Food in the Last Year: Not on file  Transportation Needs:   . Lack of Transportation (Medical): Not on file  . Lack of Transportation (Non-Medical): Not on file  Physical Activity:   . Days of Exercise per Week: Not on file  . Minutes of Exercise per Session: Not on file  Stress:   . Feeling of Stress : Not on file  Social Connections:   . Frequency of Communication with Friends and Family: Not on file  . Frequency of Social Gatherings with Friends and Family: Not on file  . Attends Religious Services: Not on file  . Active Member of Clubs or  Organizations: Not on file  . Attends Archivist Meetings: Not on file  . Marital Status: Not on file  Intimate Partner Violence:   . Fear of Current or Ex-Partner: Not on file  . Emotionally Abused: Not on file  . Physically Abused: Not on file  . Sexually Abused: Not on file    Outpatient Medications Prior to Visit  Medication Sig Dispense Refill  . Cyanocobalamin (B-12 PO) Take 1 tablet by mouth every morning.     . Omega-3 Fatty Acids (FISH OIL PO) Take 1 capsule by mouth every morning.     Marland Kitchen FLUoxetine (PROZAC) 20 MG tablet Take 1 tablet (20 mg total) by mouth daily. 30 tablet 3  . ofloxacin (OCUFLOX) 0.3 % ophthalmic solution Place 0.3 drops into both eyes once.    . testosterone cypionate (DEPOTESTOTERONE CYPIONATE) 100 MG/ML injection INJECT 1.5 ML IN THE MUSCLE EVERY 14 DAYS (Patient not taking: Reported on 06/03/2019) 10 mL 2  . timolol (TIMOPTIC) 0.5 % ophthalmic solution Place 0.5 drops into both eyes daily.    . traZODone (DESYREL) 50 MG tablet Take 0.5-1 tablets (25-50 mg total) by mouth at bedtime as needed for sleep. (Patient not taking: Reported on 04/22/2019) 30 tablet 2   No facility-administered medications prior to visit.    Allergies  Allergen Reactions  . Dust Mite Extract Other (See Comments)    sneezing  . Lisdexamfetamine Other (See Comments)    REACTION: chest pains  . Other Other (See Comments)    GRASS causes sneezing  . Pollen Extract Other (See Comments)    sneezing    ROS Review of Systems  Constitutional: Negative for chills, diaphoresis, fatigue, fever and unexpected weight change.  HENT: Negative.   Eyes: Positive for visual disturbance. Negative for photophobia.  Respiratory: Negative.   Cardiovascular: Negative.   Gastrointestinal: Negative.   Endocrine: Negative for polyphagia and polyuria.  Genitourinary: Negative.   Musculoskeletal: Negative for gait problem and joint swelling.  Allergic/Immunologic: Negative for  immunocompromised state.  Neurological: Negative for light-headedness and numbness.  Hematological: Negative.    Depression screen [facility] 2/9 06/03/2019 02/07/2019 12/03/2018  Decreased Interest 2 1 -  Down, Depressed, Hopeless 0 1 1  PHQ - 2 Score 2 2 1   Altered sleeping 1 2 0  Tired, decreased energy 1 1 3   Change in appetite 0 0 0  Feeling bad or failure about yourself  0 0 1  Trouble concentrating 1 1 2   Moving slowly or fidgety/restless 0 0 0  Suicidal thoughts 0 1 1  PHQ-9 Score 5 7 8        Objective:  Physical Exam  Constitutional: He is oriented to person, place, and time. He appears well-developed and well-nourished. No distress.  HENT:  Head: Normocephalic and atraumatic.  Right Ear: External ear normal.  Left Ear: External ear normal.  Mouth/Throat: Oropharynx is clear and moist. No oropharyngeal exudate.  Eyes: Conjunctivae are normal. Right eye exhibits no discharge. Left eye exhibits no discharge. No scleral icterus.  Fundoscopic exam:      The right eye shows no exudate, no hemorrhage and no papilledema.  Neck: No JVD present. No tracheal deviation present. No thyromegaly present.  Cardiovascular: Normal rate, regular rhythm and normal heart sounds.  Pulmonary/Chest: Effort normal and breath sounds normal. No stridor.  Lymphadenopathy:    He has no cervical adenopathy.  Neurological: He is alert and oriented to person, place, and time.  Skin: Skin is dry. He is not diaphoretic.  Psychiatric: He has a normal mood and affect. His behavior is normal.    BP 110/70   Pulse 71   Temp 98.4 F (36.9 C) (Oral)   Ht 6\' 2"  (1.88 m)   Wt 210 lb (95.3 kg)   SpO2 96%   BMI 26.96 kg/m  Wt Readings from Last 3 Encounters:  06/03/19 210 lb (95.3 kg)  04/22/19 200 lb (90.7 kg)  02/07/19 204 lb (92.5 kg)     There are no preventive care reminders to display for this patient.  There are no preventive care reminders to display for this patient.  Lab Results    Component Value Date   TSH 1.41 01/18/2019   Lab Results  Component Value Date   WBC 6.2 01/18/2019   HGB 15.6 01/18/2019   HCT 46.8 01/18/2019   MCV 91.4 01/18/2019   PLT 255.0 01/18/2019   Lab Results  Component Value Date   NA 138 01/18/2019   K 4.4 01/18/2019   CO2 30 01/18/2019   GLUCOSE 101 (H) 01/18/2019   BUN 15 01/18/2019   CREATININE 0.98 01/18/2019   BILITOT 2.1 (H) 01/18/2019   ALKPHOS 66 01/18/2019   AST 18 01/18/2019   ALT 16 01/18/2019   PROT 7.0 01/18/2019   ALBUMIN 4.8 01/18/2019   CALCIUM 9.3 01/18/2019   ANIONGAP 14 06/21/2015   GFR 81.33 01/18/2019   Lab Results  Component Value Date   CHOL 221 (H) 01/18/2019   Lab Results  Component Value Date   HDL 46.90 01/18/2019   Lab Results  Component Value Date   LDLCALC 151 (H) 01/18/2019   Lab Results  Component Value Date   TRIG 115.0 01/18/2019   Lab Results  Component Value Date   CHOLHDL 5 01/18/2019   Lab Results  Component Value Date   HGBA1C 5.2 10/28/2011      Assessment & Plan:   Problem List Items Addressed This Visit      Endocrine   Androgen deficiency   Relevant Orders   CBC   Testosterone     Other   Depression with anxiety - Primary   Relevant Medications   FLUoxetine (PROZAC) 20 MG tablet    Other Visit Diagnoses    Health care maintenance       Relevant Orders   Type and screen      Meds ordered this encounter  Medications  . FLUoxetine (PROZAC) 20 MG tablet    Sig: Take 1 tablet (20 mg total) by mouth daily.    Dispense:  90 tablet    Refill:  1    Follow-up: Return in about  6 months (around 12/01/2019), or will consider restarting testosterone..   Patient encouraged to continue exercising by walking. Libby Maw, MD

## 2019-06-03 NOTE — Addendum Note (Signed)
Addended by: Lynda Rainwater on: 06/03/2019 09:08 AM   Modules accepted: Orders

## 2019-06-03 NOTE — Addendum Note (Signed)
Addended by: Marin Roberts on: 06/03/2019 08:49 AM   Modules accepted: Orders

## 2019-06-04 ENCOUNTER — Telehealth: Payer: Self-pay | Admitting: Family Medicine

## 2019-06-04 LAB — ABO AND RH

## 2019-06-04 NOTE — Telephone Encounter (Signed)
Patient is returning your call in reference to lab results. Please call patient back at [phone].

## 2019-07-02 DIAGNOSIS — H31093 Other chorioretinal scars, bilateral: Secondary | ICD-10-CM | POA: Diagnosis not present

## 2019-07-02 DIAGNOSIS — H35372 Puckering of macula, left eye: Secondary | ICD-10-CM | POA: Diagnosis not present

## 2019-07-09 DIAGNOSIS — H26493 Other secondary cataract, bilateral: Secondary | ICD-10-CM | POA: Diagnosis not present

## 2019-07-09 DIAGNOSIS — H35372 Puckering of macula, left eye: Secondary | ICD-10-CM | POA: Diagnosis not present

## 2019-07-09 DIAGNOSIS — H40053 Ocular hypertension, bilateral: Secondary | ICD-10-CM | POA: Diagnosis not present

## 2019-07-09 DIAGNOSIS — H524 Presbyopia: Secondary | ICD-10-CM | POA: Diagnosis not present

## 2019-08-12 ENCOUNTER — Other Ambulatory Visit: Payer: Self-pay

## 2019-08-13 ENCOUNTER — Encounter: Payer: Self-pay | Admitting: Nurse Practitioner

## 2019-08-13 ENCOUNTER — Ambulatory Visit (INDEPENDENT_AMBULATORY_CARE_PROVIDER_SITE_OTHER): Payer: BC Managed Care – PPO | Admitting: Nurse Practitioner

## 2019-08-13 VITALS — BP 128/80 | HR 74 | Temp 97.7°F | Ht 74.0 in | Wt 205.4 lb

## 2019-08-13 DIAGNOSIS — K644 Residual hemorrhoidal skin tags: Secondary | ICD-10-CM | POA: Diagnosis not present

## 2019-08-13 DIAGNOSIS — R195 Other fecal abnormalities: Secondary | ICD-10-CM | POA: Diagnosis not present

## 2019-08-13 LAB — CBC WITH DIFFERENTIAL/PLATELET
Basophils Absolute: 0.1 10*3/uL (ref 0.0–0.1)
Basophils Relative: 1 % (ref 0.0–3.0)
Eosinophils Absolute: 0.2 10*3/uL (ref 0.0–0.7)
Eosinophils Relative: 3.6 % (ref 0.0–5.0)
HCT: 42.5 % (ref 39.0–52.0)
Hemoglobin: 14.3 g/dL (ref 13.0–17.0)
Lymphocytes Relative: 24.7 % (ref 12.0–46.0)
Lymphs Abs: 1.3 10*3/uL (ref 0.7–4.0)
MCHC: 33.7 g/dL (ref 30.0–36.0)
MCV: 90.2 fl (ref 78.0–100.0)
Monocytes Absolute: 0.7 10*3/uL (ref 0.1–1.0)
Monocytes Relative: 12.6 % — ABNORMAL HIGH (ref 3.0–12.0)
Neutro Abs: 3.1 10*3/uL (ref 1.4–7.7)
Neutrophils Relative %: 58.1 % (ref 43.0–77.0)
Platelets: 215 10*3/uL (ref 150.0–400.0)
RBC: 4.71 Mil/uL (ref 4.22–5.81)
RDW: 12.9 % (ref 11.5–15.5)
WBC: 5.3 10*3/uL (ref 4.0–10.5)

## 2019-08-13 LAB — IFOBT (OCCULT BLOOD): IFOBT: POSITIVE

## 2019-08-13 MED ORDER — LIDOCAINE-HYDROCORTISONE ACE 3-1 % RE KIT: 1.0000 "application " | PACK | Freq: Two times a day (BID) | RECTAL | 1 refills | Status: DC

## 2019-08-13 NOTE — Progress Notes (Signed)
Subjective:  Patient ID: Isaiah Cordova, male    DOB: 09-Apr-1971  Age: 49 y.o. MRN: [id number]  CC: Blood In Stools (Pt stated he noticed blood in stool a couple of weeks ago but last 3 days got worse//blood is darker in color//) and Abdominal Pain (Pt states that he has abdominal pains but nothing that is too bad but noticable that comes and goes)  Abdominal Pain This is a chronic problem. The current episode started more than 1 year ago. The onset quality is gradual. The problem occurs constantly. The problem has been gradually worsening. The pain is located in the generalized abdominal region. The quality of the pain is a sensation of fullness. The abdominal pain does not radiate. Pertinent negatives include no anorexia, arthralgias, belching, constipation, diarrhea, dysuria, fever, flatus, frequency, headaches, hematochezia, hematuria, melena, myalgias, nausea, vomiting or weight loss. Nothing aggravates the pain. The pain is relieved by nothing. Prior diagnostic workup includes GI consult, lower endoscopy and upper endoscopy. His past medical history is significant for GERD. There is no history of abdominal surgery, colon cancer, Crohn's disease, gallstones, irritable bowel syndrome, pancreatitis, PUD or ulcerative colitis.  hx of diverticulosis, last colonoscopy 2011, last endoscopy 2017.  Reviewed past Medical, Social and Family history today.  Outpatient Medications Prior to Visit  Medication Sig Dispense Refill  . Cyanocobalamin (B-12 PO) Take 1 tablet by mouth every morning.     Marland Kitchen FLUoxetine (PROZAC) 20 MG tablet Take 1 tablet (20 mg total) by mouth daily. 90 tablet 1  . Omega-3 Fatty Acids (FISH OIL PO) Take 1 capsule by mouth every morning.     . testosterone cypionate (DEPOTESTOTERONE CYPIONATE) 100 MG/ML injection INJECT 1.5 ML IN THE MUSCLE EVERY 14 DAYS 10 mL 2  . timolol (TIMOPTIC) 0.5 % ophthalmic solution Place 0.5 drops into both eyes daily.    Marland Kitchen ofloxacin (OCUFLOX) 0.3 %  ophthalmic solution Place 0.3 drops into both eyes once.    . traZODone (DESYREL) 50 MG tablet Take 0.5-1 tablets (25-50 mg total) by mouth at bedtime as needed for sleep. (Patient not taking: Reported on 08/13/2019) 30 tablet 2   No facility-administered medications prior to visit.   ROS See HPI  Objective:  BP 128/80   Pulse 74   Temp 97.7 F (36.5 C) (Tympanic)   Ht '6\' 2"'  (1.88 m)   Wt 205 lb 6.4 oz (93.2 kg)   SpO2 98%   BMI 26.37 kg/m   BP Readings from Last 3 Encounters:  08/13/19 128/80  06/03/19 110/70  04/22/19 122/85   Wt Readings from Last 3 Encounters:  08/13/19 205 lb 6.4 oz (93.2 kg)  06/03/19 210 lb (95.3 kg)  04/22/19 200 lb (90.7 kg)   Physical Exam Vitals reviewed. Exam conducted with a chaperone present.  Pulmonary:     Effort: Pulmonary effort is normal.  Abdominal:     General: Abdomen is flat. Bowel sounds are normal.     Palpations: Abdomen is soft.     Tenderness: There is no abdominal tenderness.     Hernia: There is no hernia in the umbilical area or ventral area.  Genitourinary:    Prostate: Not enlarged and not tender.     Rectum: Guaiac result positive. External hemorrhoid present. No mass, tenderness or anal fissure. Normal anal tone.     Comments: No blood per rectum Skin:    General: Skin is warm and dry.  Neurological:     Mental Status: He is alert and oriented  to person, place, and time.    Lab Results  Component Value Date   WBC 6.4 06/03/2019   HGB 14.4 06/03/2019   HCT 43.1 06/03/2019   PLT 251.0 06/03/2019   GLUCOSE 101 (H) 01/18/2019   CHOL 221 (H) 01/18/2019   TRIG 115.0 01/18/2019   HDL 46.90 01/18/2019   LDLCALC 151 (H) 01/18/2019   ALT 16 01/18/2019   AST 18 01/18/2019   NA 138 01/18/2019   K 4.4 01/18/2019   CL 103 01/18/2019   CREATININE 0.98 01/18/2019   BUN 15 01/18/2019   CO2 30 01/18/2019   TSH 1.41 01/18/2019   PSA 0.93 11/07/2017   HGBA1C 5.2 10/28/2011    Assessment & Plan:  This visit  occurred during the SARS-CoV-2 public health emergency.  Safety protocols were in place, including screening questions prior to the visit, additional usage of staff PPE, and extensive cleaning of exam room while observing appropriate contact time as indicated for disinfecting solutions.   Isaiah Cordova was seen today for blood in stools and abdominal pain.  Diagnoses and all orders for this visit:  Positive occult stool blood test -     IFOBT POC (occult bld, rslt in office) -     CBC w/Diff -     Ambulatory referral to Gastroenterology  Inflamed external hemorrhoid -     lidocaine-hydrocortisone (ANAMANTLE) 3-1 % KIT; Place 1 application rectally 2 (two) times daily.   I am having Isaiah Cordova start on lidocaine-hydrocortisone. I am also having him maintain his Cyanocobalamin (B-12 PO), Omega-3 Fatty Acids (FISH OIL PO), testosterone cypionate, traZODone, ofloxacin, timolol, and FLUoxetine.  Meds ordered this encounter  Medications  . lidocaine-hydrocortisone (ANAMANTLE) 3-1 % KIT    Sig: Place 1 application rectally 2 (two) times daily.    Dispense:  1 kit    Refill:  1    Order Specific Question:   Supervising Provider    Answer:   Isaiah Cordova [[id number]]   Problem List Items Addressed This Visit    None    Visit Diagnoses    Positive occult stool blood test    -  Primary   Relevant Orders   IFOBT POC (occult bld, rslt in office)   CBC w/Diff   Ambulatory referral to Gastroenterology   Inflamed external hemorrhoid       Relevant Medications   lidocaine-hydrocortisone (ANAMANTLE) 3-1 % KIT      Follow-up: Return if symptoms worsen or fail to improve.  Isaiah Lacy, NP

## 2019-08-13 NOTE — Patient Instructions (Addendum)
Go to lab for blood draw.  You will be contacted to schedule appt with GI. If you are not contacted by Thursday, please call us.  Go to ED if symptoms worsen.  Rectal Bleeding  Rectal bleeding is when blood comes out of the opening of the butt (anus). People with this kind of bleeding may notice bright red blood in their underwear or in the toilet after they poop (have a bowel movement). They may also have dark red or black poop (stool). Rectal bleeding is often a sign that something is wrong. It needs to be checked by a doctor. Follow these instructions at home: Watch for any changes in your condition. Take these actions to help with bleeding and discomfort:  Eat a diet that is high in fiber. This will keep your poop soft so it is easier for you to poop without pushing too hard. Ask your doctor to tell you what foods and drinks are high in fiber.  Drink enough fluid to keep your pee (urine) clear or pale yellow. This also helps keep your poop soft.  Try taking a warm bath. This may help with pain.  Keep all follow-up visits as told by your doctor. This is important. Get help right away if:  You have new bleeding.  You have more bleeding than before.  You have black or dark red poop.  You throw up (vomit) blood or something that looks like coffee grounds.  You have pain or tenderness in your belly (abdomen).  You have a fever.  You feel weak.  You feel sick to your stomach (nauseous).  You pass out (faint).  You have very bad pain in your butt.  You cannot poop. This information is not intended to replace advice given to you by your health care provider. Make sure you discuss any questions you have with your health care provider. Document Revised: 04/28/2017 Document Reviewed: 07/12/2015 Elsevier Patient Education  2020 [employer].

## 2019-08-15 ENCOUNTER — Telehealth: Payer: Self-pay

## 2019-08-15 DIAGNOSIS — K644 Residual hemorrhoidal skin tags: Secondary | ICD-10-CM

## 2019-08-15 NOTE — Telephone Encounter (Signed)
PA started for lidocaine-hydrocortisone (anamantle)   Key [id number]

## 2019-08-21 ENCOUNTER — Telehealth: Payer: Self-pay | Admitting: Family Medicine

## 2019-08-21 MED ORDER — HYDROCORTISONE ACE-PRAMOXINE 1-1 % EX CREA: 1.0000 "application " | TOPICAL_CREAM | Freq: Two times a day (BID) | CUTANEOUS | 0 refills | Status: DC

## 2019-08-21 NOTE — Telephone Encounter (Signed)
PA denied without alternative options.   Baldo Ash please advise

## 2019-08-21 NOTE — Telephone Encounter (Signed)
Pt stated he would be okay with either anusol or proctosol rx.

## 2019-08-21 NOTE — Addendum Note (Signed)
Addended by: Jon Billings on: 08/21/2019 03:24 PM   Modules accepted: Orders

## 2019-08-21 NOTE — Telephone Encounter (Signed)
Inform patient. Ask if he is ok with anusol or proctosol rx in place?

## 2019-08-21 NOTE — Telephone Encounter (Signed)
Patient is calling and stated that he saw Baldo Ash and she prescribed Lidocaine hydrocortisone but pharmacy hasn't received medication yet. Pt uses [facility] on [address] in [place]. CB is [phone].

## 2019-08-22 ENCOUNTER — Encounter: Payer: Self-pay | Admitting: Physician Assistant

## 2019-08-22 ENCOUNTER — Ambulatory Visit: Payer: BC Managed Care – PPO | Admitting: Physician Assistant

## 2019-08-22 VITALS — BP 120/82 | HR 54 | Temp 97.5°F | Ht 74.0 in | Wt 202.4 lb

## 2019-08-22 DIAGNOSIS — R197 Diarrhea, unspecified: Secondary | ICD-10-CM | POA: Diagnosis not present

## 2019-08-22 DIAGNOSIS — K625 Hemorrhage of anus and rectum: Secondary | ICD-10-CM

## 2019-08-22 DIAGNOSIS — R5383 Other fatigue: Secondary | ICD-10-CM | POA: Diagnosis not present

## 2019-08-22 DIAGNOSIS — R109 Unspecified abdominal pain: Secondary | ICD-10-CM

## 2019-08-22 MED ORDER — NA SULFATE-K SULFATE-MG SULF 17.5-3.13-1.6 GM/177ML PO SOLN: 1.0000 | Freq: Once | ORAL | 0 refills | Status: AC

## 2019-08-22 NOTE — Progress Notes (Signed)
Chief Complaint: Rectal bleeding, abdominal cramping and rectal pain  HPI:    Isaiah Cordova is a 49 year old male with a past medical history as listed below including hemorrhoids, neuromuscular disorder, reflux and others, known to Dr. Ardis Hughs, who was referred to me by Nche, Charlene Brooke, NP for a complaint of rectal bleeding, abdominal cramping and rectal pain.      11/11/2009 colonoscopy done for chronic frequent stools, intermittent abdominal pains and elevated CRP with findings of normal terminal ileum, mild diverticulosis in the sigmoid to descending colon and otherwise normal.  It was discussed that he likely had diarrhea predominant IBS.  Repeat was recommended in 10 years.    11/16/2015 EGD was normal.    08/13/2019 CBC was normal.  I FOBT was positive.    08/13/2019 patient saw PCP in regards to blood in stools.  It was discussed that abdominal pain is a chronic problem that has started about a year ago.  At that time he was found to have an inflamed external hemorrhoid and given lidocaine/hydrocortisone suppositories.    Today, the patient presents clinic and explains that about 3 weeks ago he started seeing a little blood in the toilet water as well as in his stool, then a week after that it started to fill the bowl and was even more than before and now he just sees some bright red blood in his stool again.  He has not seen any in the toilet for the past 9 days.  Along with this had a rectal pain described as sharp/stabbing.  Relates it to a pain he had from anal fissure years ago.  He is currently not using anything for this but is awaiting Hydrocortisone suppositories from his PCP.  Also continues with abdominal cramping which seems to have been going on for some time per him and these are just "annoying", but nothing that hinders his lifestyle.  Also relates that he has chronic food issues with eating certain things including pasta sauce, dairy, beer which will give him diarrhea.  Also  describes headaches lately and feeling very tired and moody with lower back pain and dizziness.      Does tell me he had his Covid vaccine about 12 days ago but has also been working at least 80 hours a week over the past month and feels like he has no energy and is just "not myself".    Denies fever, chills or weight loss.  Past Medical History:  Diagnosis Date  . Anxiety   . Concussion   . Depression   . Gastric ulcer   . GERD (gastroesophageal reflux disease)   . Headache(784.0)    chronic  . Hemorrhoids, external   . Hypercholesteremia   . Irritable bowel   . Neuromuscular disorder (HCC)    RLS  . Rectal bleeding    history of, negative colonoscopy 2005-2006    Past Surgical History:  Procedure Laterality Date  . CATARACT EXTRACTION, BILATERAL    . COLONOSCOPY    . GAS INSERTION Right 04/22/2019   Procedure: Insertion Of Gas;  Surgeon: Sherlynn Stalls, MD;  Location: [facility];  Service: Ophthalmology;  Laterality: Right;  . GAS/FLUID EXCHANGE Right 04/22/2019   Procedure: Gas/Fluid Exchange;  Surgeon: Sherlynn Stalls, MD;  Location: [facility];  Service: Ophthalmology;  Laterality: Right;  . HERNIA REPAIR    . NASAL SEPTUM SURGERY    . PARS PLANA VITRECTOMY Right 04/22/2019   Procedure: PARS PLANA VITRECTOMY WITH 25 GAUGE;  Surgeon: Baird Cancer,  Corene Cornea, MD;  Location: [facility];  Service: Ophthalmology;  Laterality: Right;  . PHOTOCOAGULATION WITH LASER Right 04/22/2019   Procedure: Photocoagulation With Laser;  Surgeon: Sherlynn Stalls, MD;  Location: [facility];  Service: Ophthalmology;  Laterality: Right;  . RETINAL DETACHMENT SURGERY      Current Outpatient Medications  Medication Sig Dispense Refill  . Cyanocobalamin (B-12 PO) Take 1 tablet by mouth every morning.     Marland Kitchen FLUoxetine (PROZAC) 20 MG tablet Take 1 tablet (20 mg total) by mouth daily. 90 tablet 1  . Omega-3 Fatty Acids (FISH OIL PO) Take 1 capsule by mouth every morning.     . timolol (TIMOPTIC) 0.5 % ophthalmic solution Place  0.5 drops into both eyes daily.    . pramoxine-hydrocortisone (PROCTOCREAM-HC) 1-1 % rectal cream Place 1 application rectally 2 (two) times daily. (Patient not taking: Reported on 08/22/2019) 30 g 0  . testosterone cypionate (DEPOTESTOTERONE CYPIONATE) 100 MG/ML injection INJECT 1.5 ML IN THE MUSCLE EVERY 14 DAYS (Patient not taking: Reported on 08/22/2019) 10 mL 2   No current facility-administered medications for this visit.    Allergies as of 08/22/2019 - Review Complete 08/13/2019  Allergen Reaction Noted  . Dust mite extract Other (See Comments) 11/16/2015  . Lisdexamfetamine Other (See Comments) 05/06/2010  . Other Other (See Comments) 11/16/2015  . Pollen extract Other (See Comments) 11/16/2015    Family History  Problem Relation Age of Onset  . Hypertension Father   . Diabetes Father   . Hyperlipidemia Father   . Prostate cancer Father   . Depression Maternal Grandfather        prostate  . Depression Paternal Grandfather        committed suicide  . Hypothyroidism Other   . Diabetes Mellitus II Brother     Social History   Socioeconomic History  . Marital status: Married    Spouse name: [name]  . Number of children: 1  . Years of education: Not on file  . Highest education level: Not on file  Occupational History  . 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 Smoking status: Never Smoker  . Smokeless tobacco: Never Used  Substance and Sexual Activity  . Alcohol use: Yes    Alcohol/week: 2.0 standard drinks    Types: 2 Cans of beer per week  . Drug use: Never  . Sexual activity: Not on file  Other Topics Concern  . Not on file  Social History Narrative   Regular exercise - yes   1 daughter 52 years old   Social Determinants of Radio broadcast assistant Strain:   . Difficulty of Paying Living Expenses:   Food Insecurity:   . Worried About Charity fundraiser in the Last Year:   . Arboriculturist in the Last Year:     Transportation Needs:   . Film/video editor (Medical):   Marland Kitchen Lack of Transportation (Non-Medical):   Physical Activity:   . Days of Exercise per Week:   . Minutes of Exercise per Session:   Stress:   . Feeling of Stress :   Social Connections:   . Frequency of Communication with Friends and Family:   . Frequency of Social Gatherings with Friends and Family:   . Attends Religious Services:   . Active Member of Clubs or Organizations:   . Attends Archivist Meetings:   Marland Kitchen Marital Status:   Intimate Partner Violence:   .  Fear of Current or Ex-Partner:   . Emotionally Abused:   Marland Kitchen Physically Abused:   . Sexually Abused:     Review of Systems:    Constitutional: No weight loss, fever or chills Skin: No rash  Cardiovascular: No chest pain Respiratory: No SOB  Gastrointestinal: See HPI and otherwise negative Genitourinary: No dysuria  Neurological: +HA and dizziness Musculoskeletal: No new muscle or joint pain Hematologic: No bruising Psychiatric: No history of depression or anxiety   Physical Exam:  Vital signs: BP 120/82 (BP Location: Left Arm, Patient Position: Sitting, Cuff Size: Normal)   Pulse (!) 54   Temp (!) 97.5 F (36.4 C) (Other (Comment))   Ht 6\' 2"  (1.88 m)   Wt 202 lb 6 oz (91.8 kg)   BMI 25.98 kg/m   Constitutional:   Pleasant Caucasian male appears to be in NAD, Well developed, Well nourished, alert and cooperative Head:  Normocephalic and atraumatic. Eyes:   PEERL, EOMI. No icterus. Conjunctiva pink. Ears:  Normal auditory acuity. Neck:  Supple Throat: Oral cavity and pharynx without inflammation, swelling or lesion.  Respiratory: Respirations even and unlabored. Lungs clear to auscultation bilaterally.   No wheezes, crackles, or rhonchi.  Cardiovascular: Normal S1, S2. No MRG. Regular rate and rhythm. No peripheral edema, cyanosis or pallor.  Gastrointestinal:  Soft, nondistended, mild generalized ttp. No rebound or guarding. Normal bowel  sounds. No appreciable masses or hepatomegaly. Rectal:  External: posterior tag, no visualized fissure-some gluteal cleft dermatitis which is tender per pt; Internal: fullness, no mass, no ttp (tight sphincter limits exam) Msk:  Symmetrical without gross deformities. Without edema, no deformity or joint abnormality.  Neurologic:  Alert and  oriented x4;  grossly normal neurologically.  Skin:   Dry and intact without significant lesions or rashes. Psychiatric: Demonstrates good judgement and reason without abnormal affect or behaviors.  RELEVANT LABS AND IMAGING: CBC    Component Value Date/Time   WBC 5.3 08/13/2019 1136   RBC 4.71 08/13/2019 1136   HGB 14.3 08/13/2019 1136   HCT 42.5 08/13/2019 1136   PLT 215.0 08/13/2019 1136   MCV 90.2 08/13/2019 1136   MCH 29.8 06/21/2015 2336   MCHC 33.7 08/13/2019 1136   RDW 12.9 08/13/2019 1136   LYMPHSABS 1.3 08/13/2019 1136   MONOABS 0.7 08/13/2019 1136   EOSABS 0.2 08/13/2019 1136   BASOSABS 0.1 08/13/2019 1136    CMP     Component Value Date/Time   NA 138 01/18/2019 1009   K 4.4 01/18/2019 1009   CL 103 01/18/2019 1009   CO2 30 01/18/2019 1009   GLUCOSE 101 (H) 01/18/2019 1009   BUN 15 01/18/2019 1009   CREATININE 0.98 01/18/2019 1009   CREATININE 0.81 10/28/2011 1720   CALCIUM 9.3 01/18/2019 1009   PROT 7.0 01/18/2019 1009   ALBUMIN 4.8 01/18/2019 1009   AST 18 01/18/2019 1009   ALT 16 01/18/2019 1009   ALKPHOS 66 01/18/2019 1009   BILITOT 2.1 (H) 01/18/2019 1009   GFRNONAA >60 06/21/2015 2336   GFRAA >60 06/21/2015 2336    Assessment: 1.  Rectal bleeding: Worse about 2 weeks ago, history of internal hemorrhoids, rectal exam with no obvious fissure; consider hemorrhoids versus polyps versus other 2.  Abdominal cramping: Somewhat chronic for the patient but not debilitating, likely related to IBS-D 3.  Fatigue 4.  Diarrhea: Off-and-on, worse with certain foods; most likely IBS-D  Plan: 1.  Discussed symptoms with the  patient.  It has been 10 years since his last  colonoscopy.  Would recommend that we schedule him one now for his rectal bleeding.  Scheduled him for a colonoscopy in the [place] with Dr. Ardis Hughs.  Did discuss risks, benefits, limitations and alternatives.  Patient will be Covid tested 2 days prior to time of procedure. 2.  Discussed symptoms, it could very well be that he has bleeding from internal hemorrhoids and likely that his cramping and diarrhea are still related to IBS-D which was diagnosed years ago. 3.  Briefly discussed IBS measures, but would like to see what colonoscopy shows.  If IBS is still suspected recommend patient come back to clinic so we can discuss further methods of controlling in the future. 4.  Patient already had a CBC 9 days ago with a normal hemoglobin and has not bled as much since then.  Discussed fatigue which could be related to his vaccine and/or working so much. 5.  Patient will follow in clinic with me or Dr. Ardis Hughs after procedure.  This can be arranged after his colonoscopy.  Ellouise Newer, PA-C [facility] 08/22/2019, 8:29 AM  Cc: Flossie Buffy, NP

## 2019-08-22 NOTE — Progress Notes (Signed)
I agree with the above note, plan 

## 2019-08-22 NOTE — Patient Instructions (Addendum)
If you are age 50 or older, your body mass index should be between 23-30. Your Body mass index is 25.98 kg/m. If this is out of the aforementioned range listed, please consider follow up with your Primary Care Provider.  If you are age 74 or younger, your body mass index should be between 19-25. Your Body mass index is 25.98 kg/m. If this is out of the aformentioned range listed, please consider follow up with your Primary Care Provider.   You have been scheduled for a colonoscopy. Please follow written instructions given to you at your visit today.  Please pick up your prep supplies at the pharmacy within the next 1-3 days. If you use inhalers (even only as needed), please bring them with you on the day of your procedure.  Continue to use the suppositories that were provided by your primary care provider for 7 days, twice a day.  Follow up pending Colonoscopy results or as needed.

## 2019-09-16 ENCOUNTER — Other Ambulatory Visit: Payer: Self-pay

## 2019-09-16 ENCOUNTER — Encounter: Payer: Self-pay | Admitting: Gastroenterology

## 2019-09-16 ENCOUNTER — Ambulatory Visit (AMBULATORY_SURGERY_CENTER): Payer: BC Managed Care – PPO | Admitting: Gastroenterology

## 2019-09-16 VITALS — BP 100/68 | HR 63 | Temp 97.1°F | Resp 18 | Ht 74.0 in | Wt 202.0 lb

## 2019-09-16 DIAGNOSIS — K648 Other hemorrhoids: Secondary | ICD-10-CM | POA: Diagnosis not present

## 2019-09-16 DIAGNOSIS — K5730 Diverticulosis of large intestine without perforation or abscess without bleeding: Secondary | ICD-10-CM | POA: Diagnosis not present

## 2019-09-16 DIAGNOSIS — K625 Hemorrhage of anus and rectum: Secondary | ICD-10-CM

## 2019-09-16 MED ORDER — SODIUM CHLORIDE 0.9 % IV SOLN: 500.0000 mL | INTRAVENOUS | Status: DC

## 2019-09-16 NOTE — Op Note (Signed)
[facility] Patient Name: Isaiah Cordova Procedure Date: 09/16/2019 2:16 PM MRN: [id number] Endoscopist: Milus Banister , MD Age: 49 Referring MD:  Date of Birth: [birth year] Gender: Male Account #: [id number] Procedure:                Colonoscopy Indications:              Rectal bleeding Medicines:                Monitored Anesthesia Care Procedure:                Pre-Anesthesia Assessment:                           - Prior to the procedure, a History and Physical                            was performed, and patient medications and                            allergies were reviewed. The patient's tolerance of                            previous anesthesia was also reviewed. The risks                            and benefits of the procedure and the sedation                            options and risks were discussed with the patient.                            All questions were answered, and informed consent                            was obtained. Prior Anticoagulants: The patient has                            taken no previous anticoagulant or antiplatelet                            agents. ASA Grade Assessment: II - A patient with                            mild systemic disease. After reviewing the risks                            and benefits, the patient was deemed in                            satisfactory condition to undergo the procedure.                           After obtaining informed consent, the colonoscope  was passed under direct vision. Throughout the                            procedure, the patient's blood pressure, pulse, and                            oxygen saturations were monitored continuously. The                            Colonoscope was introduced through the anus and                            advanced to the the cecum, identified by                            appendiceal orifice and ileocecal valve. The                     colonoscopy was performed without difficulty. The                            patient tolerated the procedure well. The quality                            of the bowel preparation was good. The ileocecal                            valve, appendiceal orifice, and rectum were                            photographed. Scope In: [id number] PM Scope Out: 2:58:05 PM Scope Withdrawal Time: 0 hours 8 minutes 57 seconds  Total Procedure Duration: 0 hours 11 minutes 52 seconds  Findings:                 Multiple small-mouthed diverticula were found in                            the left colon.                           External and internal hemorrhoids were found. The                            hemorrhoids were small.                           The exam was otherwise without abnormality on                            direct and retroflexion views. Complications:            No immediate complications. Estimated blood loss:                            None. Estimated Blood Loss:     Estimated blood loss: none. Impression:               -  Diverticulosis in the left colon.                           - External and internal hemorrhoids.                           - The examination was otherwise normal on direct                            and retroflexion views.                           - No polyps or cancers. Recommendation:           - Patient has a contact number available for                            emergencies. The signs and symptoms of potential                            delayed complications were discussed with the                            patient. Return to normal activities tomorrow.                            Written discharge instructions were provided to the                            patient.                           - Resume previous diet.                           - Continue present medications.                           - Repeat colonoscopy in 10 years for screening.                            - Consider in-office internal hemorrhoid banding. Milus Banister, MD 09/16/2019 3:01:12 PM This report has been signed electronically.

## 2019-09-16 NOTE — Patient Instructions (Signed)
Impression/Recommendations:  Diverticulosis handout given to patient. Hemorrhoid handout given to patient.  Resume previous diet. Continue present medications.  Repeat colonoscopy in 10 years for screening.  Consider in-office hemorrhoid banding.  YOU HAD AN ENDOSCOPIC PROCEDURE TODAY AT [facility]:   Refer to the procedure report that was given to you for any specific questions about what was found during the examination.  If the procedure report does not answer your questions, please call your gastroenterologist to clarify.  If you requested that your care partner not be given the details of your procedure findings, then the procedure report has been included in a sealed envelope for you to review at your convenience later.  YOU SHOULD EXPECT: Some feelings of bloating in the abdomen. Passage of more gas than usual.  Walking can help get rid of the air that was put into your GI tract during the procedure and reduce the bloating. If you had a lower endoscopy (such as a colonoscopy or flexible sigmoidoscopy) you may notice spotting of blood in your stool or on the toilet paper. If you underwent a bowel prep for your procedure, you may not have a normal bowel movement for a few days.  Please Note:  You might notice some irritation and congestion in your nose or some drainage.  This is from the oxygen used during your procedure.  There is no need for concern and it should clear up in a day or so.  SYMPTOMS TO REPORT IMMEDIATELY:   Following lower endoscopy (colonoscopy or flexible sigmoidoscopy):  Excessive amounts of blood in the stool  Significant tenderness or worsening of abdominal pains  Swelling of the abdomen that is new, acute  Fever of 100F or higher  For urgent or emergent issues, a gastroenterologist can be reached at any hour by calling [phone]. Do not use MyChart messaging for urgent concerns.    DIET:  We do recommend a small meal at first, but  then you may proceed to your regular diet.  Drink plenty of fluids but you should avoid alcoholic beverages for 24 hours.  ACTIVITY:  You should plan to take it easy for the rest of today and you should NOT DRIVE or use heavy machinery until tomorrow (because of the sedation medicines used during the test).    FOLLOW UP: Our staff will call the number listed on your records 48-72 hours following your procedure to check on you and address any questions or concerns that you may have regarding the information given to you following your procedure. If we do not reach you, we will leave a message.  We will attempt to reach you two times.  During this call, we will ask if you have developed any symptoms of COVID 19. If you develop any symptoms (ie: fever, flu-like symptoms, shortness of breath, cough etc.) before then, please call [phone].  If you test positive for Covid 19 in the 2 weeks post procedure, please call and report this information to [place].    If any biopsies were taken you will be contacted by phone or by letter within the next 1-3 weeks.  Please call us at [phone] if you have not heard about the biopsies in 3 weeks.    SIGNATURES/CONFIDENTIALITY: You and/or your care partner have signed paperwork which will be entered into your electronic medical record.  These signatures attest to the fact that that the information above on your After Visit Summary has been reviewed and is understood.  Full  responsibility of the confidentiality of this discharge information lies with you and/or your care-partner.

## 2019-09-18 ENCOUNTER — Telehealth: Payer: Self-pay

## 2019-09-18 NOTE — Telephone Encounter (Signed)
  Follow up Call-  Call back number 09/16/2019  Post procedure Call Back phone  # [phone] cell  Permission to leave phone message Yes  Some recent data might be hidden     Left Message

## 2019-11-14 DIAGNOSIS — M79671 Pain in right foot: Secondary | ICD-10-CM | POA: Diagnosis not present

## 2019-11-14 DIAGNOSIS — G5761 Lesion of plantar nerve, right lower limb: Secondary | ICD-10-CM | POA: Diagnosis not present

## 2019-12-03 ENCOUNTER — Other Ambulatory Visit: Payer: Self-pay

## 2019-12-03 ENCOUNTER — Ambulatory Visit: Payer: BC Managed Care – PPO | Admitting: Family Medicine

## 2019-12-03 ENCOUNTER — Encounter: Payer: Self-pay | Admitting: Family Medicine

## 2019-12-03 VITALS — BP 118/80 | HR 66 | Temp 97.7°F | Ht 74.0 in | Wt 207.0 lb

## 2019-12-03 DIAGNOSIS — E291 Testicular hypofunction: Secondary | ICD-10-CM

## 2019-12-03 DIAGNOSIS — F418 Other specified anxiety disorders: Secondary | ICD-10-CM | POA: Diagnosis not present

## 2019-12-03 DIAGNOSIS — R202 Paresthesia of skin: Secondary | ICD-10-CM

## 2019-12-03 LAB — URINALYSIS, ROUTINE W REFLEX MICROSCOPIC
Bilirubin Urine: NEGATIVE
Hgb urine dipstick: NEGATIVE
Ketones, ur: NEGATIVE
Leukocytes,Ua: NEGATIVE
Nitrite: NEGATIVE
Specific Gravity, Urine: 1.025 (ref 1.000–1.030)
Total Protein, Urine: NEGATIVE
Urine Glucose: NEGATIVE
Urobilinogen, UA: 0.2 (ref 0.0–1.0)
WBC, UA: NONE SEEN (ref 0–?)
pH: 6 (ref 5.0–8.0)

## 2019-12-03 LAB — TESTOSTERONE: Testosterone: 780.76 ng/dL (ref 300.00–890.00)

## 2019-12-03 LAB — CBC
HCT: 43 % (ref 39.0–52.0)
Hemoglobin: 14.7 g/dL (ref 13.0–17.0)
MCHC: 34.2 g/dL (ref 30.0–36.0)
MCV: 90.9 fl (ref 78.0–100.0)
Platelets: 231 10*3/uL (ref 150.0–400.0)
RBC: 4.73 Mil/uL (ref 4.22–5.81)
RDW: 13.5 % (ref 11.5–15.5)
WBC: 6.7 10*3/uL (ref 4.0–10.5)

## 2019-12-03 LAB — B12 AND FOLATE PANEL
Folate: 16.8 ng/mL (ref >5.9)
Vitamin B-12: 380 pg/mL (ref 211–911)

## 2019-12-03 LAB — TSH: TSH: 1.65 u[IU]/mL (ref 0.35–4.50)

## 2019-12-03 MED ORDER — FLUOXETINE HCL 40 MG PO CAPS: 40.0000 mg | ORAL_CAPSULE | Freq: Every day | ORAL | 0 refills | Status: DC

## 2019-12-03 NOTE — Patient Instructions (Signed)
Paresthesia Paresthesia is an abnormal burning or prickling sensation. It is usually felt in the hands, arms, legs, or feet. However, it may occur in any part of the body. Usually, paresthesia is not painful. It may feel like:  Tingling or numbness.  Buzzing.  Itching. Paresthesia may occur without any clear cause, or it may be caused by:  Breathing too quickly (hyperventilation).  Pressure on a nerve.  An underlying medical condition.  Side effects of a medication.  Nutritional deficiencies.  Exposure to toxic chemicals. Most people experience temporary (transient) paresthesia at some time in their lives. For some people, it may be long-lasting (chronic) because of an underlying medical condition. If you have paresthesia that lasts a long time, you may need to be evaluated by your health care provider. Follow these instructions at home: Alcohol use   Do not drink alcohol if: ? Your health care provider tells you not to drink. ? You are pregnant, may be pregnant, or are planning to become pregnant.  If you drink alcohol: ? Limit how much you use to:  0-1 drink a day for women.  0-2 drinks a day for men. ? Be aware of how much alcohol is in your drink. In the [place] one drink equals one 12 oz bottle of beer (355 mL), one 5 oz glass of wine (148 mL), or one 1 oz glass of hard liquor (44 mL). Nutrition   Eat a healthy diet. This includes: ? Eating foods that are high in fiber, such as fresh fruits and vegetables, whole grains, and beans. ? Limiting foods that are high in fat and processed sugars, such as fried or sweet foods. General instructions  Take over-the-counter and prescription medicines only as told by your health care provider.  Do not use any products that contain nicotine or tobacco, such as cigarettes and e-cigarettes. These can keep blood from reaching damaged nerves. If you need help quitting, ask your health care provider.  If you have diabetes, work  closely with your health care provider to keep your blood sugar under control.  If you have numbness in your feet: ? Check every day for signs of injury or infection. Watch for redness, warmth, and swelling. ? Wear padded socks and comfortable shoes. These help protect your feet.  Keep all follow-up visits as told by your health care provider. This is important. Contact a health care provider if you:  Have paresthesia that gets worse or does not go away.  Have a burning or prickling feeling that gets worse when you walk.  Have pain, cramps, or dizziness.  Develop a rash. Get help right away if you:  Feel weak.  Have trouble walking or moving.  Have problems with speech, understanding, or vision.  Feel confused.  Cannot control your bladder or bowel movements.  Have numbness after an injury.  Develop new weakness in an arm or leg.  Faint. Summary  Paresthesia is an abnormal burning or prickling sensation that is usually felt in the hands, arms, legs, or feet. It may also occur in other parts of the body.  Paresthesia may occur without any clear cause, or it may be caused by breathing too quickly (hyperventilation), pressure on a nerve, an underlying medical condition, side effects of a medication, nutritional deficiencies, or exposure to toxic chemicals.  If you have paresthesia that lasts a long time, you may need to be evaluated by your health care provider. This information is not intended to replace advice given to you by   your health care provider. Make sure you discuss any questions you have with your health care provider. Document Revised: 06/11/2018 Document Reviewed: 05/25/2017 Elsevier Patient Education  [place], Adult After being diagnosed with an anxiety disorder, you may be relieved to know why you have felt or behaved a certain way. You may also feel overwhelmed about the treatment ahead and what it will mean for your life. With  care and support, you can manage this condition and recover from it. How to manage lifestyle changes Managing stress and anxiety  Stress is your body's reaction to life changes and events, both good and bad. Most stress will last just a few hours, but stress can be ongoing and can lead to more than just stress. Although stress can play a major role in anxiety, it is not the same as anxiety. Stress is usually caused by something external, such as a deadline, test, or competition. Stress normally passes after the triggering event has ended.  Anxiety is caused by something internal, such as imagining a terrible outcome or worrying that something will go wrong that will devastate you. Anxiety often does not go away even after the triggering event is over, and it can become long-term (chronic) worry. It is important to understand the differences between stress and anxiety and to manage your stress effectively so that it does not lead to an anxious response. Talk with your health care provider or a counselor to learn more about reducing anxiety and stress. He or she may suggest tension reduction techniques, such as:  Music therapy. This can include creating or listening to music that you enjoy and that inspires you.  Mindfulness-based meditation. This involves being aware of your normal breaths while not trying to control your breathing. It can be done while sitting or walking.  Centering prayer. This involves focusing on a word, phrase, or sacred image that means something to you and brings you peace.  Deep breathing. To do this, expand your stomach and inhale slowly through your nose. Hold your breath for 3-5 seconds. Then exhale slowly, letting your stomach muscles relax.  Self-talk. This involves identifying thought patterns that lead to anxiety reactions and changing those patterns.  Muscle relaxation. This involves tensing muscles and then relaxing them. Choose a tension reduction technique that  suits your lifestyle and personality. These techniques take time and practice. Set aside 5-15 minutes a day to do them. Therapists can offer counseling and training in these techniques. The training to help with anxiety may be covered by some insurance plans. Other things you can do to manage stress and anxiety include:  Keeping a stress/anxiety diary. This can help you learn what triggers your reaction and then learn ways to manage your response.  Thinking about how you react to certain situations. You may not be able to control everything, but you can control your response.  Making time for activities that help you relax and not feeling guilty about spending your time in this way.  Visual imagery and yoga can help you stay calm and relax.  Medicines Medicines can help ease symptoms. Medicines for anxiety include:  Anti-anxiety drugs.  Antidepressants. Medicines are often used as a primary treatment for anxiety disorder. Medicines will be prescribed by a health care provider. When used together, medicines, psychotherapy, and tension reduction techniques may be the most effective treatment. Relationships Relationships can play a big part in helping you recover. Try to spend more time connecting with trusted friends and  family members. Consider going to couples counseling, taking family education classes, or going to family therapy. Therapy can help you and others better understand your condition. How to recognize changes in your anxiety Everyone responds differently to treatment for anxiety. Recovery from anxiety happens when symptoms decrease and stop interfering with your daily activities at home or work. This may mean that you will start to:  Have better concentration and focus. Worry will interfere less in your daily thinking.  Sleep better.  Be less irritable.  Have more energy.  Have improved memory. It is important to recognize when your condition is getting worse. Contact your  health care provider if your symptoms interfere with home or work and you feel like your condition is not improving. Follow these instructions at home: Activity  Exercise. Most adults should do the following: ? Exercise for at least 150 minutes each week. The exercise should increase your heart rate and make you sweat (moderate-intensity exercise). ? Strengthening exercises at least twice a week.  Get the right amount and quality of sleep. Most adults need 7-9 hours of sleep each night. Lifestyle   Eat a healthy diet that includes plenty of vegetables, fruits, whole grains, low-fat dairy products, and lean protein. Do not eat a lot of foods that are high in solid fats, added sugars, or salt.  Make choices that simplify your life.  Do not use any products that contain nicotine or tobacco, such as cigarettes, e-cigarettes, and chewing tobacco. If you need help quitting, ask your health care provider.  Avoid caffeine, alcohol, and certain over-the-counter cold medicines. These may make you feel worse. Ask your pharmacist which medicines to avoid. General instructions  Take over-the-counter and prescription medicines only as told by your health care provider.  Keep all follow-up visits as told by your health care provider. This is important. Where to find support You can get help and support from these sources:  Self-help groups.  Online and [employer].  A trusted spiritual leader.  Couples counseling.  Family education classes.  Family therapy. Where to find more information You may find that joining a support group helps you deal with your anxiety. The following sources can help you locate counselors or support groups near you:  [place]: www.mentalhealthamerica.net  Anxiety and Depression Association of [place] (ADAA): https://www.clark.net/  National Alliance on Mental Illness (NAMI): www.nami.org Contact a health care provider if you:  Have a hard time  staying focused or finishing daily tasks.  Spend many hours a day feeling worried about everyday life.  Become exhausted by worry.  Start to have headaches, feel tense, or have nausea.  Urinate more than normal.  Have diarrhea. Get help right away if you have:  A racing heart and shortness of breath.  Thoughts of hurting yourself or others. If you ever feel like you may hurt yourself or others, or have thoughts about taking your own life, get help right away. You can go to your nearest emergency department or call:  Your local emergency services (911 in the [place]  A suicide crisis helpline, such as the [place] at (901)053-2149. This is open 24 hours a day. Summary  Taking steps to learn and use tension reduction techniques can help calm you and help prevent triggering an anxiety reaction.  When used together, medicines, psychotherapy, and tension reduction techniques may be the most effective treatment.  Family, friends, and partners can play a big part in helping you recover from an anxiety  disorder. This information is not intended to replace advice given to you by your health care provider. Make sure you discuss any questions you have with your health care provider. Document Revised: 10/16/2018 Document Reviewed: 10/16/2018 Elsevier Patient Education  [place].

## 2019-12-03 NOTE — Progress Notes (Signed)
Established Patient Office Visit  Subjective:  Patient ID: Isaiah Cordova, male    DOB: [birth year]  Age: 49 y.o. MRN: [id number]  CC:  Chief Complaint  Patient presents with   Follow-up    6 month follow up on depression, pt states that he have had some tingling in fingers and toes x 1 month.     HPI [name] presents for follow-up of his anxiety with depression and androgen deficiency.  Still is feeling a little bit down and blue.  Continues to feel anxious and has a short fuse.  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  Has developed tingling in his hands and feet.  Denies headaches, diplopia, neck or lower back pain.  Past Medical History:  Diagnosis Date   Anxiety    Cataract    bilateral removed   Concussion    Depression    Detached retina    2 right eye, 1 in left eye   Gastric ulcer    GERD (gastroesophageal reflux disease)    Headache(784.0)    chronic   Hemorrhoids, external    Hypercholesteremia    Irritable bowel    Left ankle injury     torn ligements x1   Neuromuscular disorder (HCC)    RLS   Rectal bleeding    history of, negative colonoscopy 2005-2006   Right ankle injury    torn ligemnts x3    Past Surgical History:  Procedure Laterality Date   CATARACT EXTRACTION, BILATERAL     COLONOSCOPY     fracture collar bone     GAS INSERTION Right 04/22/2019   Procedure: Insertion Of Gas;  Surgeon: Sherlynn Stalls, MD;  Location: [facility];  Service: Ophthalmology;  Laterality: Right;   GAS/FLUID EXCHANGE Right 04/22/2019   Procedure: Gas/Fluid Exchange;  Surgeon: Sherlynn Stalls, MD;  Location: [facility];  Service: Ophthalmology;  Laterality: Right;   HERNIA REPAIR     multiplr fractures around left eye     NASAL SEPTUM SURGERY     PARS PLANA VITRECTOMY Right 04/22/2019   Procedure: PARS PLANA VITRECTOMY WITH 25 GAUGE;  Surgeon: Sherlynn Stalls, MD;  Location: [facility];  Service:  Ophthalmology;  Laterality: Right;   PHOTOCOAGULATION WITH LASER Right 04/22/2019   Procedure: Photocoagulation With Laser;  Surgeon: Sherlynn Stalls, MD;  Location: [facility];  Service: Ophthalmology;  Laterality: Right;   POLYPECTOMY     RETINAL DETACHMENT SURGERY     right fractured hand     UPPER GASTROINTESTINAL ENDOSCOPY      Family History  Problem Relation Age of Onset   Hypertension Father    Diabetes Father    Hyperlipidemia Father    Depression Maternal Grandfather        prostate   Prostate cancer Maternal Grandfather    Depression Paternal Grandfather        committed suicide   Hypothyroidism Other    Diabetes Mellitus II Brother    Colon cancer Neg Hx    Esophageal cancer Neg Hx    Rectal cancer Neg Hx    Stomach cancer Neg Hx     Social History   Socioeconomic History   Marital status: Married    Spouse name: [name]   Number of children: 1   Years of education: Not on file   Highest education level: Not on file  Occupational History   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[employer]  Employer: [employer]  Tobacco Use   Smoking status: Never Smoker   Smokeless tobacco: Never Used  Vaping Use   Vaping Use: Never used  Substance and Sexual Activity   Alcohol use: Yes    Alcohol/week: 2.0 standard drinks    Types: 2 Cans of beer per week    Comment: per month   Drug use: Never   Sexual activity: Yes  Other Topics Concern   Not on file  Social History Narrative   Regular exercise - yes   1 daughter 69 years old   Social Determinants of Radio broadcast assistant Strain:    Difficulty of Paying Living Expenses:   Food Insecurity:    Worried About Charity fundraiser in the Last Year:    Arboriculturist in the Last Year:   Transportation Needs:    Film/video editor (Medical):    Lack of Transportation (Non-Medical):   Physical Activity:    Days of Exercise per Week:    Minutes of Exercise per Session:   Stress:      Feeling of Stress :   Social Connections:    Frequency of Communication with Friends and Family:    Frequency of Social Gatherings with Friends and Family:    Attends Religious Services:    Active Member of Clubs or Organizations:    Attends Music therapist:    Marital Status:   Intimate Partner Violence:    Fear of Current or Ex-Partner:    Emotionally Abused:    Physically Abused:    Sexually Abused:     Outpatient Medications Prior to Visit  Medication Sig Dispense Refill   Cyanocobalamin (B-12 PO) Take 1 tablet by mouth every morning.      Omega-3 Fatty Acids (FISH OIL PO) Take 1 capsule by mouth every morning.      pramoxine-hydrocortisone (PROCTOCREAM-HC) 1-1 % rectal cream Place 1 application rectally 2 (two) times daily. 30 g 0   testosterone cypionate (DEPOTESTOTERONE CYPIONATE) 100 MG/ML injection INJECT 1.5 ML IN THE MUSCLE EVERY 14 DAYS 10 mL 2   timolol (TIMOPTIC) 0.5 % ophthalmic solution Place 0.5 drops into both eyes daily.     FLUoxetine (PROZAC) 20 MG tablet Take 1 tablet (20 mg total) by mouth daily. 90 tablet 1   No facility-administered medications prior to visit.    Allergies  Allergen Reactions   Dust Mite Extract Other (See Comments)    sneezing   Lisdexamfetamine Other (See Comments)    REACTION: chest pains   Other Other (See Comments)    GRASS causes sneezing   Pollen Extract Other (See Comments)    sneezing    ROS Review of Systems  Constitutional: Negative.   HENT: Negative.   Eyes: Negative for photophobia and visual disturbance.  Respiratory: Negative.   Cardiovascular: Negative.   Gastrointestinal: Negative.   Genitourinary: Negative.  Negative for difficulty urinating, frequency and urgency.  Musculoskeletal: Negative for arthralgias, back pain, neck pain and neck stiffness.  Allergic/Immunologic: Negative for immunocompromised state.  Neurological: Positive for numbness. Negative for tremors,  speech difficulty, weakness, light-headedness and headaches.  Hematological: Does not bruise/bleed easily.       Depression screen [facility] 2/9 12/03/2019 12/03/2019 06/03/2019  Decreased Interest 1 0 2  Down, Depressed, Hopeless 0 0 0  PHQ - 2 Score 1 0 2  Altered sleeping 0 - 1  Tired, decreased energy 2 - 1  Change in appetite 0 - 0  Feeling bad or failure about yourself  0 - 0  Trouble concentrating 2 - 1  Moving slowly or fidgety/restless 0 - 0  Suicidal thoughts 0 - 0  PHQ-9 Score 5 - 5  Difficult doing work/chores Not difficult at all - -    Objective:    Physical Exam Vitals and nursing note reviewed.  Constitutional:      General: He is not in acute distress.    Appearance: Normal appearance. He is normal weight. He is diaphoretic. He is not ill-appearing or toxic-appearing.  HENT:     Head: Normocephalic and atraumatic.     Mouth/Throat:     Mouth: Mucous membranes are dry.     Pharynx: Oropharynx is clear.  Eyes:     General: No scleral icterus.       Right eye: No discharge.        Left eye: No discharge.     Extraocular Movements: Extraocular movements intact.     Conjunctiva/sclera: Conjunctivae normal.     Pupils: Pupils are equal, round, and reactive to light.  Cardiovascular:     Rate and Rhythm: Normal rate and regular rhythm.  Pulmonary:     Effort: Pulmonary effort is normal.     Breath sounds: Normal breath sounds.  Musculoskeletal:     Cervical back: No rigidity or tenderness. No pain with movement. Normal range of motion.     Lumbar back: No spasms or tenderness. Normal range of motion.       Back:  Lymphadenopathy:     Cervical: No cervical adenopathy.  Skin:    General: Skin is warm.  Neurological:     Mental Status: He is alert and oriented to person, place, and time.  Psychiatric:        Mood and Affect: Mood normal.        Behavior: Behavior normal.     BP 118/80    Pulse 66    Temp 97.7 F (36.5 C) (Tympanic)    Ht 6\' 2"  (1.88 m)    Wt  207 lb (93.9 kg)    SpO2 96%    BMI 26.58 kg/m  Wt Readings from Last 3 Encounters:  12/03/19 207 lb (93.9 kg)  09/16/19 202 lb (91.6 kg)  08/22/19 202 lb 6 oz (91.8 kg)     Health Maintenance Due  Topic Date Due   Hepatitis C Screening  Never done   COVID-19 Vaccine (1) Never done    There are no preventive care reminders to display for this patient.  Lab Results  Component Value Date   TSH 1.41 01/18/2019   Lab Results  Component Value Date   WBC 5.3 08/13/2019   HGB 14.3 08/13/2019   HCT 42.5 08/13/2019   MCV 90.2 08/13/2019   PLT 215.0 08/13/2019   Lab Results  Component Value Date   NA 138 01/18/2019   K 4.4 01/18/2019   CO2 30 01/18/2019   GLUCOSE 101 (H) 01/18/2019   BUN 15 01/18/2019   CREATININE 0.98 01/18/2019   BILITOT 2.1 (H) 01/18/2019   ALKPHOS 66 01/18/2019   AST 18 01/18/2019   ALT 16 01/18/2019   PROT 7.0 01/18/2019   ALBUMIN 4.8 01/18/2019   CALCIUM 9.3 01/18/2019   ANIONGAP 14 06/21/2015   GFR 81.33 01/18/2019   Lab Results  Component Value Date   CHOL 221 (H) 01/18/2019   Lab Results  Component Value Date   HDL 46.90 01/18/2019   Lab Results  Component Value  Date   LDLCALC 151 (H) 01/18/2019   Lab Results  Component Value Date   TRIG 115.0 01/18/2019   Lab Results  Component Value Date   CHOLHDL 5 01/18/2019   Lab Results  Component Value Date   HGBA1C 5.2 10/28/2011      Assessment & Plan:   Problem List Items Addressed This Visit      Endocrine   Androgen deficiency - Primary   Relevant Orders   Testosterone     Other   Depression with anxiety   Relevant Medications   FLUoxetine (PROZAC) 40 MG capsule    Other Visit Diagnoses    Paresthesias       Relevant Orders   CBC   Urinalysis, Routine w reflex microscopic   TSH   B12 and Folate Panel      Meds ordered this encounter  Medications   FLUoxetine (PROZAC) 40 MG capsule    Sig: Take 1 capsule (40 mg total) by mouth daily.    Dispense:  90  capsule    Refill:  0    Follow-up: Return return in 2-3 months for physical and follow up.  Increased Prozac to 40 mg daily.  Testosterone level today 4 days after last injection.  Return for physical in 2 to 3 months.  Libby Maw, MD

## 2019-12-27 ENCOUNTER — Other Ambulatory Visit: Payer: Self-pay | Admitting: Family Medicine

## 2019-12-27 DIAGNOSIS — F418 Other specified anxiety disorders: Secondary | ICD-10-CM

## 2019-12-31 DIAGNOSIS — H26491 Other secondary cataract, right eye: Secondary | ICD-10-CM | POA: Diagnosis not present

## 2019-12-31 DIAGNOSIS — H35372 Puckering of macula, left eye: Secondary | ICD-10-CM | POA: Diagnosis not present

## 2019-12-31 DIAGNOSIS — H31093 Other chorioretinal scars, bilateral: Secondary | ICD-10-CM | POA: Diagnosis not present

## 2019-12-31 DIAGNOSIS — H35032 Hypertensive retinopathy, left eye: Secondary | ICD-10-CM | POA: Diagnosis not present

## 2020-01-07 DIAGNOSIS — H0100B Unspecified blepharitis left eye, upper and lower eyelids: Secondary | ICD-10-CM | POA: Diagnosis not present

## 2020-01-07 DIAGNOSIS — H0100A Unspecified blepharitis right eye, upper and lower eyelids: Secondary | ICD-10-CM | POA: Diagnosis not present

## 2020-01-07 DIAGNOSIS — H40053 Ocular hypertension, bilateral: Secondary | ICD-10-CM | POA: Diagnosis not present

## 2020-01-16 DIAGNOSIS — M9901 Segmental and somatic dysfunction of cervical region: Secondary | ICD-10-CM | POA: Diagnosis not present

## 2020-01-16 DIAGNOSIS — M545 Low back pain: Secondary | ICD-10-CM | POA: Diagnosis not present

## 2020-01-16 DIAGNOSIS — M9903 Segmental and somatic dysfunction of lumbar region: Secondary | ICD-10-CM | POA: Diagnosis not present

## 2020-01-16 DIAGNOSIS — M542 Cervicalgia: Secondary | ICD-10-CM | POA: Diagnosis not present

## 2020-01-21 DIAGNOSIS — M545 Low back pain: Secondary | ICD-10-CM | POA: Diagnosis not present

## 2020-01-21 DIAGNOSIS — M9903 Segmental and somatic dysfunction of lumbar region: Secondary | ICD-10-CM | POA: Diagnosis not present

## 2020-01-21 DIAGNOSIS — M542 Cervicalgia: Secondary | ICD-10-CM | POA: Diagnosis not present

## 2020-01-21 DIAGNOSIS — M9901 Segmental and somatic dysfunction of cervical region: Secondary | ICD-10-CM | POA: Diagnosis not present

## 2020-01-28 DIAGNOSIS — M9901 Segmental and somatic dysfunction of cervical region: Secondary | ICD-10-CM | POA: Diagnosis not present

## 2020-01-28 DIAGNOSIS — M9903 Segmental and somatic dysfunction of lumbar region: Secondary | ICD-10-CM | POA: Diagnosis not present

## 2020-01-28 DIAGNOSIS — M545 Low back pain: Secondary | ICD-10-CM | POA: Diagnosis not present

## 2020-01-28 DIAGNOSIS — M542 Cervicalgia: Secondary | ICD-10-CM | POA: Diagnosis not present

## 2020-02-11 ENCOUNTER — Other Ambulatory Visit: Payer: Self-pay

## 2020-02-12 ENCOUNTER — Encounter: Payer: Self-pay | Admitting: Family Medicine

## 2020-02-12 ENCOUNTER — Ambulatory Visit (INDEPENDENT_AMBULATORY_CARE_PROVIDER_SITE_OTHER): Payer: BC Managed Care – PPO | Admitting: Family Medicine

## 2020-02-12 VITALS — BP 122/80 | HR 66 | Temp 97.0°F | Ht 74.0 in | Wt 209.2 lb

## 2020-02-12 DIAGNOSIS — R0609 Other forms of dyspnea: Secondary | ICD-10-CM

## 2020-02-12 DIAGNOSIS — E7800 Pure hypercholesterolemia, unspecified: Secondary | ICD-10-CM

## 2020-02-12 DIAGNOSIS — K219 Gastro-esophageal reflux disease without esophagitis: Secondary | ICD-10-CM | POA: Diagnosis not present

## 2020-02-12 DIAGNOSIS — R3912 Poor urinary stream: Secondary | ICD-10-CM

## 2020-02-12 DIAGNOSIS — R1013 Epigastric pain: Secondary | ICD-10-CM

## 2020-02-12 DIAGNOSIS — Z0000 Encounter for general adult medical examination without abnormal findings: Secondary | ICD-10-CM | POA: Diagnosis not present

## 2020-02-12 DIAGNOSIS — E291 Testicular hypofunction: Secondary | ICD-10-CM | POA: Diagnosis not present

## 2020-02-12 DIAGNOSIS — N401 Enlarged prostate with lower urinary tract symptoms: Secondary | ICD-10-CM

## 2020-02-12 DIAGNOSIS — Z23 Encounter for immunization: Secondary | ICD-10-CM

## 2020-02-12 DIAGNOSIS — R0789 Other chest pain: Secondary | ICD-10-CM

## 2020-02-12 DIAGNOSIS — R0600 Dyspnea, unspecified: Secondary | ICD-10-CM | POA: Insufficient documentation

## 2020-02-12 DIAGNOSIS — F418 Other specified anxiety disorders: Secondary | ICD-10-CM | POA: Diagnosis not present

## 2020-02-12 HISTORY — DX: Dyspnea, unspecified: R06.00

## 2020-02-12 HISTORY — DX: Encounter for immunization: Z23

## 2020-02-12 HISTORY — DX: Benign prostatic hyperplasia with lower urinary tract symptoms: N40.1

## 2020-02-12 HISTORY — DX: Other chest pain: R07.89

## 2020-02-12 HISTORY — DX: Other forms of dyspnea: R06.09

## 2020-02-12 LAB — COMPREHENSIVE METABOLIC PANEL
ALT: 15 U/L (ref 0–53)
AST: 18 U/L (ref 0–37)
Albumin: 4.4 g/dL (ref 3.5–5.2)
Alkaline Phosphatase: 64 U/L (ref 39–117)
BUN: 17 mg/dL (ref 6–23)
CO2: 28 mEq/L (ref 19–32)
Calcium: 9.2 mg/dL (ref 8.4–10.5)
Chloride: 103 mEq/L (ref 96–112)
Creatinine, Ser: 1.01 mg/dL (ref 0.40–1.50)
GFR: 78.21 mL/min (ref >60.00)
Glucose, Bld: 97 mg/dL (ref 70–99)
Potassium: 4.5 mEq/L (ref 3.5–5.1)
Sodium: 139 mEq/L (ref 135–145)
Total Bilirubin: 1.5 mg/dL — ABNORMAL HIGH (ref 0.2–1.2)
Total Protein: 6.6 g/dL (ref 6.0–8.3)

## 2020-02-12 LAB — URINALYSIS, ROUTINE W REFLEX MICROSCOPIC
Bilirubin Urine: NEGATIVE
Hgb urine dipstick: NEGATIVE
Ketones, ur: NEGATIVE
Leukocytes,Ua: NEGATIVE
Nitrite: NEGATIVE
RBC / HPF: NONE SEEN (ref 0–?)
Specific Gravity, Urine: 1.02 (ref 1.000–1.030)
Total Protein, Urine: NEGATIVE
Urine Glucose: NEGATIVE
Urobilinogen, UA: 0.2 (ref 0.0–1.0)
WBC, UA: NONE SEEN (ref 0–?)
pH: 6.5 (ref 5.0–8.0)

## 2020-02-12 LAB — CBC
HCT: 43 % (ref 39.0–52.0)
Hemoglobin: 14.4 g/dL (ref 13.0–17.0)
MCHC: 33.5 g/dL (ref 30.0–36.0)
MCV: 90.8 fl (ref 78.0–100.0)
Platelets: 255 10*3/uL (ref 150.0–400.0)
RBC: 4.73 Mil/uL (ref 4.22–5.81)
RDW: 13.2 % (ref 11.5–15.5)
WBC: 5.6 10*3/uL (ref 4.0–10.5)

## 2020-02-12 LAB — LIPID PANEL
Cholesterol: 209 mg/dL — ABNORMAL HIGH (ref 0–200)
HDL: 44.7 mg/dL (ref >39.00)
LDL Cholesterol: 138 mg/dL — ABNORMAL HIGH (ref 0–99)
NonHDL: 164.54
Total CHOL/HDL Ratio: 5
Triglycerides: 133 mg/dL (ref 0.0–149.0)
VLDL: 26.6 mg/dL (ref 0.0–40.0)

## 2020-02-12 LAB — TESTOSTERONE: Testosterone: 222.26 ng/dL — ABNORMAL LOW (ref 300.00–890.00)

## 2020-02-12 LAB — AMYLASE: Amylase: 32 U/L (ref 27–131)

## 2020-02-12 LAB — TSH: TSH: 1.5 u[IU]/mL (ref 0.35–4.50)

## 2020-02-12 LAB — PSA: PSA: 0.97 ng/mL (ref 0.10–4.00)

## 2020-02-12 MED ORDER — OMEPRAZOLE 40 MG PO CPDR: 40.0000 mg | DELAYED_RELEASE_CAPSULE | Freq: Every day | ORAL | 3 refills | Status: DC

## 2020-02-12 NOTE — Progress Notes (Addendum)
Established Patient Office Visit  Subjective:  Patient ID: Isaiah Cordova, male    DOB: August 15, 1970  Age: 49 y.o. MRN: [id number]  CC:  Chief Complaint  Patient presents with  . Annual Exam    CPE, patient states that he's had some chest pains that come and go x 1 month.     HPI Isaiah Cordova presents for for a physical exam and follow-up of multiple problems and concerns.  Prozac continues to help with her depression anxiety and worry.  He continues to worry about his job stability.  He reports shortness of breath after walking up the stairs.  There is also some associated lower upper abdominal pain.  The pain is partially relieved with over-the-counter omeprazole.  He denies dysphagia.  Has 1 or 2 beers weekly.  Has multiple stools daily.  Urine flow has been weak.  Denies pre and post void dribble.  Denies nocturia.  Denies caffeine use.  He has had multiple colonoscopies.  He sees the dentist regularly.  Past Medical History:  Diagnosis Date  . Anxiety   . Cataract    bilateral removed  . Concussion   . Depression   . Detached retina    2 right eye, 1 in left eye  . Gastric ulcer   . GERD (gastroesophageal reflux disease)   . Headache(784.0)    chronic  . Hemorrhoids, external   . Hypercholesteremia   . Irritable bowel   . Left ankle injury     torn ligements x1  . Neuromuscular disorder (HCC)    RLS  . Rectal bleeding    history of, negative colonoscopy 2005-2006  . Right ankle injury    torn ligemnts x3    Past Surgical History:  Procedure Laterality Date  . CATARACT EXTRACTION, BILATERAL    . COLONOSCOPY    . fracture collar bone    . GAS INSERTION Right 04/22/2019   Procedure: Insertion Of Gas;  Cordova: Sherlynn Stalls, MD;  Location: [place];  Service: Ophthalmology;  Laterality: Right;  . GAS/FLUID EXCHANGE Right 04/22/2019   Procedure: Gas/Fluid Exchange;  Cordova: Sherlynn Stalls, MD;  Location: [place];  Service: Ophthalmology;  Laterality: Right;  .  HERNIA REPAIR    . multiplr fractures around left eye    . NASAL SEPTUM SURGERY    . PARS PLANA VITRECTOMY Right 04/22/2019   Procedure: PARS PLANA VITRECTOMY WITH 25 GAUGE;  Cordova: Sherlynn Stalls, MD;  Location: [facility];  Service: Ophthalmology;  Laterality: Right;  . PHOTOCOAGULATION WITH LASER Right 04/22/2019   Procedure: Photocoagulation With Laser;  Cordova: Sherlynn Stalls, MD;  Location: [facility];  Service: Ophthalmology;  Laterality: Right;  . POLYPECTOMY    . RETINAL DETACHMENT SURGERY    . right fractured hand    . UPPER GASTROINTESTINAL ENDOSCOPY      Family History  Problem Relation Age of Onset  . Hypertension Father   . Diabetes Father   . Hyperlipidemia Father   . Depression Maternal Grandfather        prostate  . Prostate cancer Maternal Grandfather   . Depression Paternal Grandfather        committed suicide  . Hypothyroidism Other   . Diabetes Mellitus II Brother   . Colon cancer Neg Hx   . Esophageal cancer Neg Hx   . Rectal cancer Neg Hx   . Stomach cancer Neg Hx     Social History   Socioeconomic History  . Marital status: Married  Spouse name: [name]  . Number of children: 1  . Years of education: Not on file  . Highest education level: Not on file  Occupational History  . 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    Employer: [employer]  Tobacco Use  . Smoking status: Never Smoker  . Smokeless tobacco: Never Used  Vaping Use  . Vaping Use: Never used  Substance and Sexual Activity  . Alcohol use: Yes    Alcohol/week: 2.0 standard drinks    Types: 2 Cans of beer per week    Comment: per month  . Drug use: Never  . Sexual activity: Yes  Other Topics Concern  . Not on file  Social History Narrative   Regular exercise - yes   1 daughter 78 years old   Social Determinants of Health   Financial Resource Strain:   . Difficulty of Paying Living Expenses: Not on file  Food Insecurity:   . Worried About Charity fundraiser in the Last Year: Not  on file  . Ran Out of Food in the Last Year: Not on file  Transportation Needs:   . Lack of Transportation (Medical): Not on file  . Lack of Transportation (Non-Medical): Not on file  Physical Activity:   . Days of Exercise per Week: Not on file  . Minutes of Exercise per Session: Not on file  Stress:   . Feeling of Stress : Not on file  Social Connections:   . Frequency of Communication with Friends and Family: Not on file  . Frequency of Social Gatherings with Friends and Family: Not on file  . Attends Religious Services: Not on file  . Active Member of Clubs or Organizations: Not on file  . Attends Archivist Meetings: Not on file  . Marital Status: Not on file  Intimate Partner Violence:   . Fear of Current or Ex-Partner: Not on file  . Emotionally Abused: Not on file  . Physically Abused: Not on file  . Sexually Abused: Not on file    Outpatient Medications Prior to Visit  Medication Sig Dispense Refill  . Cyanocobalamin (B-12 PO) Take 1 tablet by mouth every morning.     Marland Kitchen FLUoxetine (PROZAC) 40 MG capsule Take 1 capsule (40 mg total) by mouth daily. 90 capsule 0  . Omega-3 Fatty Acids (FISH OIL PO) Take 1 capsule by mouth every morning.     . pramoxine-hydrocortisone (PROCTOCREAM-HC) 1-1 % rectal cream Place 1 application rectally 2 (two) times daily. 30 g 0  . testosterone cypionate (DEPOTESTOTERONE CYPIONATE) 100 MG/ML injection INJECT 1.5 ML IN THE MUSCLE EVERY 14 DAYS 10 mL 2  . timolol (TIMOPTIC) 0.5 % ophthalmic solution Place 0.5 drops into both eyes daily.    Marland Kitchen FLUoxetine (PROZAC) 20 MG tablet Take 2 tablets (40 mg total) by mouth daily. 180 tablet 0   No facility-administered medications prior to visit.    Allergies  Allergen Reactions  . Dust Mite Extract Other (See Comments)    sneezing  . Lisdexamfetamine Other (See Comments)    REACTION: chest pains  . Other Other (See Comments)    GRASS causes sneezing  . Pollen Extract Other (See Comments)      sneezing    ROS Review of Systems  Constitutional: Negative.   HENT: Negative.   Eyes: Negative for photophobia and visual disturbance.  Respiratory: Positive for shortness of breath. Negative for chest tightness and wheezing.   Cardiovascular: Positive for chest pain.  Gastrointestinal: Positive  for abdominal pain.  Endocrine: Negative for polyphagia and polyuria.  Genitourinary: Positive for difficulty urinating. Negative for dysuria, frequency and urgency.  Musculoskeletal: Negative for joint swelling and myalgias.  Skin: Negative for pallor and rash.  Allergic/Immunologic: Negative for immunocompromised state.  Neurological: Negative for speech difficulty, weakness and numbness.  Hematological: Does not bruise/bleed easily.      Objective:    Physical Exam Vitals and nursing note reviewed.  Constitutional:      General: He is not in acute distress.    Appearance: Normal appearance. He is normal weight. He is not ill-appearing or toxic-appearing.  HENT:     Head: Normocephalic and atraumatic.     Right Ear: Tympanic membrane, ear canal and external ear normal.     Left Ear: Tympanic membrane, ear canal and external ear normal.  Eyes:     General: No scleral icterus.       Left eye: No discharge.     Extraocular Movements: Extraocular movements intact.     Conjunctiva/sclera: Conjunctivae normal.     Pupils: Pupils are equal, round, and reactive to light.  Cardiovascular:     Rate and Rhythm: Normal rate and regular rhythm.  Pulmonary:     Effort: Pulmonary effort is normal.     Breath sounds: Normal breath sounds.  Abdominal:     General: Abdomen is flat. Bowel sounds are normal. There is no distension.     Palpations: There is no mass.     Tenderness: There is abdominal tenderness in the epigastric area. There is no guarding or rebound.     Hernia: No hernia is present.  Genitourinary:    Prostate: Enlarged. Not tender and no nodules present.     Rectum:  Guaiac result negative. External hemorrhoid and internal hemorrhoid present. No mass, tenderness or anal fissure. Normal anal tone.  Musculoskeletal:     Cervical back: Normal range of motion. No rigidity or tenderness.     Right lower leg: No edema.     Left lower leg: No edema.  Lymphadenopathy:     Cervical: No cervical adenopathy.  Skin:    General: Skin is warm and dry.  Neurological:     Mental Status: He is alert and oriented to person, place, and time.  Psychiatric:        Mood and Affect: Mood normal.        Behavior: Behavior normal.     BP 122/80   Pulse 66   Temp (!) 97 F (36.1 C) (Tympanic)   Ht 6\' 2"  (1.88 m)   Wt 209 lb 3.2 oz (94.9 kg)   SpO2 97%   BMI 26.86 kg/m  Wt Readings from Last 3 Encounters:  02/12/20 209 lb 3.2 oz (94.9 kg)  12/03/19 207 lb (93.9 kg)  09/16/19 202 lb (91.6 kg)   The 10-year ASCVD risk score Mikey Bussing DC Jr., et al., 2013) is: 3.5%   Values used to calculate the score:     Age: 57 years     Sex: Male     Is Non-Hispanic African American: No     Diabetic: No     Tobacco smoker: No     Systolic Blood Pressure: 161 mmHg     Is BP treated: No     HDL Cholesterol: 44.7 mg/dL     Total Cholesterol: 209 mg/dL  Health Maintenance Due  Topic Date Due  . Hepatitis C Screening  Never done    There are no preventive care  reminders to display for this patient.  Lab Results  Component Value Date   TSH 1.65 12/03/2019   Lab Results  Component Value Date   WBC 6.7 12/03/2019   HGB 14.7 12/03/2019   HCT 43.0 12/03/2019   MCV 90.9 12/03/2019   PLT 231.0 12/03/2019   Lab Results  Component Value Date   NA 138 01/18/2019   K 4.4 01/18/2019   CO2 30 01/18/2019   GLUCOSE 101 (H) 01/18/2019   BUN 15 01/18/2019   CREATININE 0.98 01/18/2019   BILITOT 2.1 (H) 01/18/2019   ALKPHOS 66 01/18/2019   AST 18 01/18/2019   ALT 16 01/18/2019   PROT 7.0 01/18/2019   ALBUMIN 4.8 01/18/2019   CALCIUM 9.3 01/18/2019   ANIONGAP 14 06/21/2015     GFR 81.33 01/18/2019   Lab Results  Component Value Date   CHOL 221 (H) 01/18/2019   Lab Results  Component Value Date   HDL 46.90 01/18/2019   Lab Results  Component Value Date   LDLCALC 151 (H) 01/18/2019   Lab Results  Component Value Date   TRIG 115.0 01/18/2019   Lab Results  Component Value Date   CHOLHDL 5 01/18/2019   Lab Results  Component Value Date   HGBA1C 5.2 10/28/2011      Assessment & Plan:   Problem List Items Addressed This Visit      Digestive   Gastroesophageal reflux disease   Relevant Medications   omeprazole (PRILOSEC) 40 MG capsule     Endocrine   Androgen deficiency   Relevant Orders   Testosterone     Other   Healthcare maintenance   Midepigastric pain   Relevant Medications   omeprazole (PRILOSEC) 40 MG capsule   Other Relevant Orders   Amylase   Elevated LDL cholesterol level   Relevant Orders   Lipid panel   Depression with anxiety   Benign prostatic hyperplasia with weak urinary stream   Relevant Orders   PSA   Urinalysis, Routine w reflex microscopic   Atypical chest pain   Relevant Orders   CBC   Comprehensive metabolic panel   TSH   Ambulatory referral to Cardiology   DOE (dyspnea on exertion)   Relevant Orders   Ambulatory referral to Cardiology   Need for influenza vaccination - Primary   Relevant Orders   Flu Vaccine QUAD 6+ mos PF IM (Fluarix Quad PF) (Completed)      Meds ordered this encounter  Medications  . omeprazole (PRILOSEC) 40 MG capsule    Sig: Take 1 capsule (40 mg total) by mouth daily.    Dispense:  30 capsule    Refill:  3    Follow-up: No follow-ups on file.   Have increased omeprazole to 40 mg daily.  We will refer her for cardiac stress test.  Continue Prozac for depression and anxiety.  Discussed trying a different medication but he would like to continue with the Prozac.  Encouraged continued exercise.  Discussed using a statin with persistent elevation of LDL cholesterol.   6-week follow-up to check on epigastric pain and shortness of breath. Libby Maw, MD

## 2020-02-12 NOTE — Patient Instructions (Signed)
Benign Prostatic Hyperplasia  Benign prostatic hyperplasia (BPH) is an enlarged prostate gland that is caused by the normal aging process and not by cancer. The prostate is a walnut-sized gland that is involved in the production of semen. It is located in front of the rectum and below the bladder. The bladder stores urine and the urethra is the tube that carries the urine out of the body. The prostate may get bigger as a man gets older. An enlarged prostate can press on the urethra. This can make it harder to pass urine. The build-up of urine in the bladder can cause infection. Back pressure and infection may progress to bladder damage and kidney (renal) failure. What are the causes? This condition is part of a normal aging process. However, not all men develop problems from this condition. If the prostate enlarges away from the urethra, urine flow will not be blocked. If it enlarges toward the urethra and compresses it, there will be problems passing urine. What increases the risk? This condition is more likely to develop in men over the age of 16 years. What are the signs or symptoms? Symptoms of this condition include:  Getting up often during the night to urinate.  Needing to urinate frequently during the day.  Difficulty starting urine flow.  Decrease in size and strength of your urine stream.  Leaking (dribbling) after urinating.  Inability to pass urine. This needs immediate treatment.  Inability to completely empty your bladder.  Pain when you pass urine. This is more common if there is also an infection.  Urinary tract infection (UTI). How is this diagnosed? This condition is diagnosed based on your medical history, a physical exam, and your symptoms. Tests will also be done, such as:  A post-void bladder scan. This measures any amount of urine that may remain in your bladder after you finish urinating.  A digital rectal exam. In a rectal exam, your health care provider  checks your prostate by putting a lubricated, gloved finger into your rectum to feel the back of your prostate gland. This exam detects the size of your gland and any abnormal lumps or growths.  An exam of your urine (urinalysis).  A prostate specific antigen (PSA) screening. This is a blood test used to screen for prostate cancer.  An ultrasound. This test uses sound waves to electronically produce a picture of your prostate gland. Your health care provider may refer you to a specialist in kidney and prostate diseases (urologist). How is this treated? Once symptoms begin, your health care provider will monitor your condition (active surveillance or watchful waiting). Treatment for this condition will depend on the severity of your condition. Treatment may include:  Observation and yearly exams. This may be the only treatment needed if your condition and symptoms are mild.  Medicines to relieve your symptoms, including: ? Medicines to shrink the prostate. ? Medicines to relax the muscle of the prostate.  Surgery in severe cases. Surgery may include: ? Prostatectomy. In this procedure, the prostate tissue is removed completely through an open incision or with a laparoscope or robotics. ? Transurethral resection of the prostate (TURP). In this procedure, a tool is inserted through the opening at the tip of the penis (urethra). It is used to cut away tissue of the inner core of the prostate. The pieces are removed through the same opening of the penis. This removes the blockage. ? Transurethral incision (TUIP). In this procedure, small cuts are made in the prostate. This lessens  the prostate's pressure on the urethra. ? Transurethral microwave thermotherapy (TUMT). This procedure uses microwaves to create heat. The heat destroys and removes a small amount of prostate tissue. ? Transurethral needle ablation (TUNA). This procedure uses radio frequencies to destroy and remove a small amount of  prostate tissue. ? Interstitial laser coagulation ([place]). This procedure uses a laser to destroy and remove a small amount of prostate tissue. ? Transurethral electrovaporization (TUVP). This procedure uses electrodes to destroy and remove a small amount of prostate tissue. ? Prostatic urethral lift. This procedure inserts an implant to push the lobes of the prostate away from the urethra. Follow these instructions at home:  Take over-the-counter and prescription medicines only as told by your health care provider.  Monitor your symptoms for any changes. Contact your health care provider with any changes.  Avoid drinking large amounts of liquid before going to bed or out in public.  Avoid or reduce how much caffeine or alcohol you drink.  Give yourself time when you urinate.  Keep all follow-up visits as told by your health care provider. This is important. Contact a health care provider if:  You have unexplained back pain.  Your symptoms do not get better with treatment.  You develop side effects from the medicine you are taking.  Your urine becomes very dark or has a bad smell.  Your lower abdomen becomes distended and you have trouble passing your urine. Get help right away if:  You have a fever or chills.  You suddenly cannot urinate.  You feel lightheaded, or very dizzy, or you faint.  There are large amounts of blood or clots in the urine.  Your urinary problems become hard to manage.  You develop moderate to severe low back or flank pain. The flank is the side of your body between the ribs and the hip. These symptoms may represent a serious problem that is an emergency. Do not wait to see if the symptoms will go away. Get medical help right away. Call your local emergency services (911 in the [place] Do not drive yourself to the hospital. Summary  Benign prostatic hyperplasia (BPH) is an enlarged prostate that is caused by the normal aging process and not by  cancer.  An enlarged prostate can press on the urethra. This can make it hard to pass urine.  This condition is part of a normal aging process and is more likely to develop in men over the age of 51 years.  Get help right away if you suddenly cannot urinate. This information is not intended to replace advice given to you by your health care provider. Make sure you discuss any questions you have with your health care provider. Document Revised: 04/10/2018 Document Reviewed: 06/20/2016 Elsevier Patient Education  2020 [employer] Maintenance, Male Adopting a healthy lifestyle and getting preventive care are important in promoting health and wellness. Ask your health care provider about:  The right schedule for you to have regular tests and exams.  Things you can do on your own to prevent diseases and keep yourself healthy. What should I know about diet, weight, and exercise? Eat a healthy diet   Eat a diet that includes plenty of vegetables, fruits, low-fat dairy products, and lean protein.  Do not eat a lot of foods that are high in solid fats, added sugars, or sodium. Maintain a healthy weight Body mass index (BMI) is a measurement that can be used to identify possible weight problems. It estimates body  fat based on height and weight. Your health care provider can help determine your BMI and help you achieve or maintain a healthy weight. Get regular exercise Get regular exercise. This is one of the most important things you can do for your health. Most adults should:  Exercise for at least 150 minutes each week. The exercise should increase your heart rate and make you sweat (moderate-intensity exercise).  Do strengthening exercises at least twice a week. This is in addition to the moderate-intensity exercise.  Spend less time sitting. Even light physical activity can be beneficial. Watch cholesterol and blood lipids Have your blood tested for lipids and cholesterol at  50 years of age, then have this test every 5 years. You may need to have your cholesterol levels checked more often if:  Your lipid or cholesterol levels are high.  You are older than 50 years of age.  You are at high risk for heart disease. What should I know about cancer screening? Many types of cancers can be detected early and may often be prevented. Depending on your health history and family history, you may need to have cancer screening at various ages. This may include screening for:  Colorectal cancer.  Prostate cancer.  Skin cancer.  Lung cancer. What should I know about heart disease, diabetes, and high blood pressure? Blood pressure and heart disease  High blood pressure causes heart disease and increases the risk of stroke. This is more likely to develop in people who have high blood pressure readings, are of African descent, or are overweight.  Talk with your health care provider about your target blood pressure readings.  Have your blood pressure checked: ? Every 3-5 years if you are 25-32 years of age. ? Every year if you are 33 years old or older.  If you are between the ages of 103 and 41 and are a current or former smoker, ask your health care provider if you should have a one-time screening for abdominal aortic aneurysm (AAA). Diabetes Have regular diabetes screenings. This checks your fasting blood sugar level. Have the screening done:  Once every three years after age 62 if you are at a normal weight and have a low risk for diabetes.  More often and at a younger age if you are overweight or have a high risk for diabetes. What should I know about preventing infection? Hepatitis B If you have a higher risk for hepatitis B, you should be screened for this virus. Talk with your health care provider to find out if you are at risk for hepatitis B infection. Hepatitis C Blood testing is recommended for:  Everyone born from [birth year] through 1966.  Anyone with  known risk factors for hepatitis C. Sexually transmitted infections (STIs)  You should be screened each year for STIs, including gonorrhea and chlamydia, if: ? You are sexually active and are younger than 50 years of age. ? You are older than 50 years of age and your health care provider tells you that you are at risk for this type of infection. ? Your sexual activity has changed since you were last screened, and you are at increased risk for chlamydia or gonorrhea. Ask your health care provider if you are at risk.  Ask your health care provider about whether you are at high risk for HIV. Your health care provider may recommend a prescription medicine to help prevent HIV infection. If you choose to take medicine to prevent HIV, you should first get tested for  HIV. You should then be tested every 3 months for as long as you are taking the medicine. Follow these instructions at home: Lifestyle  Do not use any products that contain nicotine or tobacco, such as cigarettes, e-cigarettes, and chewing tobacco. If you need help quitting, ask your health care provider.  Do not use street drugs.  Do not share needles.  Ask your health care provider for help if you need support or information about quitting drugs. Alcohol use  Do not drink alcohol if your health care provider tells you not to drink.  If you drink alcohol: ? Limit how much you have to 0-2 drinks a day. ? Be aware of how much alcohol is in your drink. In the [place] one drink equals one 12 oz bottle of beer (355 mL), one 5 oz glass of wine (148 mL), or one 1 oz glass of hard liquor (44 mL). General instructions  Schedule regular health, dental, and eye exams.  Stay current with your vaccines.  Tell your health care provider if: ? You often feel depressed. ? You have ever been abused or do not feel safe at home. Summary  Adopting a healthy lifestyle and getting preventive care are important in promoting health and  wellness.  Follow your health care provider's instructions about healthy diet, exercising, and getting tested or screened for diseases.  Follow your health care provider's instructions on monitoring your cholesterol and blood pressure. This information is not intended to replace advice given to you by your health care provider. Make sure you discuss any questions you have with your health care provider. Document Revised: 05/09/2018 Document Reviewed: 05/09/2018 Elsevier Patient Education  2020 [employer]  Preventive Care 18-26 Years Old, Male Preventive care refers to lifestyle choices and visits with your health care provider that can promote health and wellness. This includes:  A yearly physical exam. This is also called an annual well check.  Regular dental and eye exams.  Immunizations.  Screening for certain conditions.  Healthy lifestyle choices, such as eating a healthy diet, getting regular exercise, not using drugs or products that contain nicotine and tobacco, and limiting alcohol use. What can I expect for my preventive care visit? Physical exam Your health care provider will check:  Height and weight. These may be used to calculate body mass index (BMI), which is a measurement that tells if you are at a healthy weight.  Heart rate and blood pressure.  Your skin for abnormal spots. Counseling Your health care provider may ask you questions about:  Alcohol, tobacco, and drug use.  Emotional well-being.  Home and relationship well-being.  Sexual activity.  Eating habits.  Work and work Statistician. What immunizations do I need?  Influenza (flu) vaccine  This is recommended every year. Tetanus, diphtheria, and pertussis (Tdap) vaccine  You may need a Td booster every 10 years. Varicella (chickenpox) vaccine  You may need this vaccine if you have not already been vaccinated. Zoster (shingles) vaccine  You may need this after age 67. Measles, mumps,  and rubella (MMR) vaccine  You may need at least one dose of MMR if you were born in 1957 or later. You may also need a second dose. Pneumococcal conjugate (PCV13) vaccine  You may need this if you have certain conditions and were not previously vaccinated. Pneumococcal polysaccharide (PPSV23) vaccine  You may need one or two doses if you smoke cigarettes or if you have certain conditions. Meningococcal conjugate (MenACWY) vaccine  You may need  this if you have certain conditions. Hepatitis A vaccine  You may need this if you have certain conditions or if you travel or work in places where you may be exposed to hepatitis A. Hepatitis B vaccine  You may need this if you have certain conditions or if you travel or work in places where you may be exposed to hepatitis B. Haemophilus influenzae type b (Hib) vaccine  You may need this if you have certain risk factors. Human papillomavirus (HPV) vaccine  If recommended by your health care provider, you may need three doses over 6 months. You may receive vaccines as individual doses or as more than one vaccine together in one shot (combination vaccines). Talk with your health care provider about the risks and benefits of combination vaccines. What tests do I need? Blood tests  Lipid and cholesterol levels. These may be checked every 5 years, or more frequently if you are over 31 years old.  Hepatitis C test.  Hepatitis B test. Screening  Lung cancer screening. You may have this screening every year starting at age 97 if you have a 30-pack-year history of smoking and currently smoke or have quit within the past 15 years.  Prostate cancer screening. Recommendations will vary depending on your family history and other risks.  Colorectal cancer screening. All adults should have this screening starting at age 45 and continuing until age 58. Your health care provider may recommend screening at age 53 if you are at increased risk. You will  have tests every 1-10 years, depending on your results and the type of screening test.  Diabetes screening. This is done by checking your blood sugar (glucose) after you have not eaten for a while (fasting). You may have this done every 1-3 years.  Sexually transmitted disease (STD) testing. Follow these instructions at home: Eating and drinking  Eat a diet that includes fresh fruits and vegetables, whole grains, lean protein, and low-fat dairy products.  Take vitamin and mineral supplements as recommended by your health care provider.  Do not drink alcohol if your health care provider tells you not to drink.  If you drink alcohol: ? Limit how much you have to 0-2 drinks a day. ? Be aware of how much alcohol is in your drink. In the [place] one drink equals one 12 oz bottle of beer (355 mL), one 5 oz glass of wine (148 mL), or one 1 oz glass of hard liquor (44 mL). Lifestyle  Take daily care of your teeth and gums.  Stay active. Exercise for at least 30 minutes on 5 or more days each week.  Do not use any products that contain nicotine or tobacco, such as cigarettes, e-cigarettes, and chewing tobacco. If you need help quitting, ask your health care provider.  If you are sexually active, practice safe sex. Use a condom or other form of protection to prevent STIs (sexually transmitted infections).  Talk with your health care provider about taking a low-dose aspirin every day starting at age 93. What's next?  Go to your health care provider once a year for a well check visit.  Ask your health care provider how often you should have your eyes and teeth checked.  Stay up to date on all vaccines. This information is not intended to replace advice given to you by your health care provider. Make sure you discuss any questions you have with your health care provider. Document Revised: 05/10/2018 Document Reviewed: 05/10/2018 Elsevier Patient Education  2020 [employer].

## 2020-02-13 ENCOUNTER — Encounter: Payer: Self-pay | Admitting: Family Medicine

## 2020-02-19 ENCOUNTER — Telehealth: Payer: Self-pay | Admitting: Family Medicine

## 2020-02-19 NOTE — Telephone Encounter (Signed)
Patient is returning the call, please advise. CB is [phone]

## 2020-02-19 NOTE — Telephone Encounter (Signed)
Returned patients call no answer LM with lab results and recommendation on identified VM. Asked patient to give [place] a call back.

## 2020-03-05 DIAGNOSIS — K625 Hemorrhage of anus and rectum: Secondary | ICD-10-CM | POA: Insufficient documentation

## 2020-03-05 DIAGNOSIS — S99912A Unspecified injury of left ankle, initial encounter: Secondary | ICD-10-CM | POA: Insufficient documentation

## 2020-03-05 DIAGNOSIS — H3320 Serous retinal detachment, unspecified eye: Secondary | ICD-10-CM | POA: Insufficient documentation

## 2020-03-05 DIAGNOSIS — F32A Depression, unspecified: Secondary | ICD-10-CM | POA: Insufficient documentation

## 2020-03-05 DIAGNOSIS — G709 Myoneural disorder, unspecified: Secondary | ICD-10-CM | POA: Insufficient documentation

## 2020-03-05 DIAGNOSIS — F419 Anxiety disorder, unspecified: Secondary | ICD-10-CM | POA: Insufficient documentation

## 2020-03-05 DIAGNOSIS — H269 Unspecified cataract: Secondary | ICD-10-CM | POA: Insufficient documentation

## 2020-03-05 DIAGNOSIS — K644 Residual hemorrhoidal skin tags: Secondary | ICD-10-CM | POA: Insufficient documentation

## 2020-03-05 DIAGNOSIS — K259 Gastric ulcer, unspecified as acute or chronic, without hemorrhage or perforation: Secondary | ICD-10-CM | POA: Insufficient documentation

## 2020-03-05 DIAGNOSIS — S99911A Unspecified injury of right ankle, initial encounter: Secondary | ICD-10-CM | POA: Insufficient documentation

## 2020-03-05 DIAGNOSIS — E7800 Pure hypercholesterolemia, unspecified: Secondary | ICD-10-CM | POA: Insufficient documentation

## 2020-03-05 DIAGNOSIS — K589 Irritable bowel syndrome without diarrhea: Secondary | ICD-10-CM | POA: Insufficient documentation

## 2020-03-05 DIAGNOSIS — K219 Gastro-esophageal reflux disease without esophagitis: Secondary | ICD-10-CM | POA: Insufficient documentation

## 2020-03-06 ENCOUNTER — Encounter: Payer: Self-pay | Admitting: Cardiology

## 2020-03-06 ENCOUNTER — Ambulatory Visit: Payer: BC Managed Care – PPO | Admitting: Cardiology

## 2020-03-06 ENCOUNTER — Other Ambulatory Visit: Payer: Self-pay

## 2020-03-06 VITALS — BP 110/64 | HR 71 | Ht 73.0 in | Wt 208.8 lb

## 2020-03-06 DIAGNOSIS — R0609 Other forms of dyspnea: Secondary | ICD-10-CM

## 2020-03-06 DIAGNOSIS — E7800 Pure hypercholesterolemia, unspecified: Secondary | ICD-10-CM | POA: Diagnosis not present

## 2020-03-06 DIAGNOSIS — R0600 Dyspnea, unspecified: Secondary | ICD-10-CM

## 2020-03-06 DIAGNOSIS — R0789 Other chest pain: Secondary | ICD-10-CM | POA: Diagnosis not present

## 2020-03-06 MED ORDER — METOPROLOL TARTRATE 100 MG PO TABS: 100.0000 mg | ORAL_TABLET | Freq: Once | ORAL | 0 refills | Status: DC

## 2020-03-06 NOTE — Progress Notes (Signed)
Cardiology Consultation:    Date:  03/06/2020   ID:  Isaiah Cordova, DOB [birth year], MRN [id number]  PCP:  Libby Maw, MD  Cardiologist:  Jenne Campus, MD   Referring MD: Libby Maw   No chief complaint on file. I have shortness of breath and chest pain  History of Present Illness:    Isaiah Cordova is a 49 y.o. male who is being seen today for the evaluation of shortness of breath chest pain at the request of Libby Maw,*.  He is a healthy-appearing gentleman who exercise on the regular basis.  Every day he wakes up at 430 in morning and does weightlifting as well as some cardio.  For last few weeks he noticed some pain in the epigastrium and lower chest.  He said that sensation was continuous and constant.  There was no aggravating no relieving factor.  He took omeprazole given by his primary care physician symptoms improved significantly.  However he also got concerns about shortness of breath that he gets.  He thinks he may be getting a little older however it decreased his ability to exercise.  Denies having any typical chest pain tightness squeezing pressure burning chest that is exertional. Risk factors for coronary artery disease: He never smoked, he does have family history of premature coronary artery disease, there is no hypertension there is no diabetes.  He does have elevated cholesterol however his calculated 10 years risk came as 3.2% which is low.  Past Medical History:  Diagnosis Date  . Androgen deficiency 11/07/2017  . Anxiety   . Atypical chest pain 02/12/2020  . Benign prostatic hyperplasia with weak urinary stream 02/12/2020  . Bronchitis 05/21/2018  . Cataract    bilateral removed  . Concussion   . Depression   . Depression with anxiety 02/07/2019  . Detached retina    2 right eye, 1 in left eye  . DOE (dyspnea on exertion) 02/12/2020  . Elevated LDL cholesterol level 02/07/2019  . Fatigue 08/26/2013  . Food allergy  11/07/2017  . Gastric pain 10/16/2015  . Gastric ulcer   . Gastroesophageal reflux disease 10/16/2015  . GERD (gastroesophageal reflux disease)   . Gilbert's syndrome 01/24/2014  . Headache(784.0)    chronic  . Healthcare maintenance 06/16/2014  . Hemorrhoids, external   . History of retinal detachment 05/21/2018  . Hypercholesteremia   . Irritable bowel   . Irritable bowel syndrome with diarrhea 11/07/2017  . Joint stiffness of both ankles 07/27/2018  . Joint stiffness of both knees 07/27/2018  . Left ankle injury     torn ligements x1  . Midepigastric pain 10/27/2015  . Need for influenza vaccination 02/12/2020  . Neuromuscular disorder (HCC)    RLS  . Rectal bleeding    history of, negative colonoscopy 2005-2006  . Right ankle injury    torn ligemnts x3  . RLS (restless legs syndrome) 11/16/2012    Past Surgical History:  Procedure Laterality Date  . CATARACT EXTRACTION, BILATERAL    . COLONOSCOPY    . fracture collar bone    . GAS INSERTION Right 04/22/2019   Procedure: Insertion Of Gas;  Surgeon: Sherlynn Stalls, MD;  Location: [place];  Service: Ophthalmology;  Laterality: Right;  . GAS/FLUID EXCHANGE Right 04/22/2019   Procedure: Gas/Fluid Exchange;  Surgeon: Sherlynn Stalls, MD;  Location: [place];  Service: Ophthalmology;  Laterality: Right;  . HERNIA REPAIR    . multiplr fractures around left eye    .  NASAL SEPTUM SURGERY    . PARS PLANA VITRECTOMY Right 04/22/2019   Procedure: PARS PLANA VITRECTOMY WITH 25 GAUGE;  Surgeon: Sherlynn Stalls, MD;  Location: [place];  Service: Ophthalmology;  Laterality: Right;  . PHOTOCOAGULATION WITH LASER Right 04/22/2019   Procedure: Photocoagulation With Laser;  Surgeon: Sherlynn Stalls, MD;  Location: [place];  Service: Ophthalmology;  Laterality: Right;  . POLYPECTOMY    . RETINAL DETACHMENT SURGERY    . right fractured hand    . UPPER GASTROINTESTINAL ENDOSCOPY      Current Medications: Current Meds  Medication Sig  . Cyanocobalamin  (B-12 PO) Take 1 tablet by mouth every morning.   Marland Kitchen FLUoxetine (PROZAC) 40 MG capsule Take 1 capsule (40 mg total) by mouth daily.  . Omega-3 Fatty Acids (FISH OIL PO) Take 1 capsule by mouth every morning.   Marland Kitchen omeprazole (PRILOSEC) 40 MG capsule Take 1 capsule (40 mg total) by mouth daily.  Marland Kitchen testosterone cypionate (DEPOTESTOTERONE CYPIONATE) 100 MG/ML injection INJECT 1.5 ML IN THE MUSCLE EVERY 14 DAYS  . timolol (TIMOPTIC) 0.5 % ophthalmic solution Place 0.5 drops into both eyes daily.     Allergies:   Dust mite extract, Lisdexamfetamine, Other, and Pollen extract   Social History   Socioeconomic History  . Marital status: Married    Spouse name: [name]  . Number of children: 1  . Years of education: Not on file  . Highest education level: Not on file  Occupational History  . 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[employer]    Employer: [employer]  Tobacco Use  . Smoking status: Never Smoker  . Smokeless tobacco: Never Used  Vaping Use  . Vaping Use: Never used  Substance and Sexual Activity  . Alcohol use: Yes    Alcohol/week: 2.0 standard drinks    Types: 2 Cans of beer per week    Comment: per month  . Drug use: Never  . Sexual activity: Yes  Other Topics Concern  . Not on file  Social History Narrative   Regular exercise - yes   1 daughter 51 years old   Social Determinants of Health   Financial Resource Strain:   . Difficulty of Paying Living Expenses: Not on file  Food Insecurity:   . Worried About Charity fundraiser in the Last Year: Not on file  . Ran Out of Food in the Last Year: Not on file  Transportation Needs:   . Lack of Transportation (Medical): Not on file  . Lack of Transportation (Non-Medical): Not on file  Physical Activity:   . Days of Exercise per Week: Not on file  . Minutes of Exercise per Session: Not on file  Stress:   . Feeling of Stress : Not on file  Social Connections:   . Frequency of Communication with Friends and Family: Not on file  .  Frequency of Social Gatherings with Friends and Family: Not on file  . Attends Religious Services: Not on file  . Active Member of Clubs or Organizations: Not on file  . Attends Archivist Meetings: Not on file  . Marital Status: Not on file     Family History: The patient's family history includes Depression in his maternal grandfather and paternal grandfather; Diabetes in his father; Diabetes Mellitus II in his brother; Hyperlipidemia in his father; Hypertension in his father; Hypothyroidism in an other family member; Prostate cancer in his maternal grandfather. There is no history of Colon cancer, Esophageal cancer, Rectal cancer, or  Stomach cancer. ROS:   Please see the history of present illness.    All 14 point review of systems negative except as described per history of present illness.  EKGs/Labs/Other Studies Reviewed:    The following studies were reviewed today: Normal sinus rhythm normal P interval normal QS complex duration morphology no ST segment changes    Recent Labs: 02/12/2020: ALT 15; BUN 17; Creatinine, Ser 1.01; Hemoglobin 14.4; Platelets 255.0; Potassium 4.5; Sodium 139; TSH 1.50  Recent Lipid Panel    Component Value Date/Time   CHOL 209 (H) 02/12/2020 0843   TRIG 133.0 02/12/2020 0843   HDL 44.70 02/12/2020 0843   CHOLHDL 5 02/12/2020 0843   VLDL 26.6 02/12/2020 0843   LDLCALC 138 (H) 02/12/2020 0843    Physical Exam:    VS:  BP 110/64   Pulse 71   Ht 6\' 1"  (1.854 m)   Wt 208 lb 12.8 oz (94.7 kg)   SpO2 97%   BMI 27.55 kg/m     Wt Readings from Last 3 Encounters:  03/06/20 208 lb 12.8 oz (94.7 kg)  02/12/20 209 lb 3.2 oz (94.9 kg)  12/03/19 207 lb (93.9 kg)     GEN:  Well nourished, well developed in no acute distress HEENT: Normal NECK: No JVD; No carotid bruits LYMPHATICS: No lymphadenopathy CARDIAC: RRR, no murmurs, no rubs, no gallops RESPIRATORY:  Clear to auscultation without rales, wheezing or rhonchi  ABDOMEN: Soft,  non-tender, non-distended MUSCULOSKELETAL:  No edema; No deformity  SKIN: Warm and dry NEUROLOGIC:  Alert and oriented x 3 PSYCHIATRIC:  Normal affect   ASSESSMENT:    1. Atypical chest pain   2. Elevated LDL cholesterol level   3. Dyspnea on exertion    PLAN:    In order of problems listed above:  1. Atypical chest pain.  From the description he gave me informed the fact that he is responding to proton pump inhibitor also from the fact that he does have some pain on palpation in the epigastrium I suspect this is GI issue.  He reports to have GERD for many years.  I think he can benefit from GI consult.  In my opinion he need to have gastroscopy to make sure he does not have significant stricture he reports to have some difficulty swallowing sometimes as well as to make sure he does not have Barrett's esophagus.  What make me concerned the most is his ability to exercise which he said he decreased.  In spite of very atypical symptomatology in terms of chest pain I still think it would be reasonable to work him up for coronary artery disease.  The true is that he does not have many risk factors but I think it would be prudent to do coronary CT angio.  That will help [place] to stratify his risk factors as well.  If he does have some calcification of coronary arteries and not significant hemodynamically obstruction will be beneficial for him to be on antiplatelet therapy as well as statin. 2. Elevated LDL.  I did calculate his 10 years risk for coronary events which is low only 3.2.  In this situation statin is not recommended unless there is premature coronary artery disease in the family that is not.  The key right now is risk factors modification meaning continue exercises on the regular basis as well as diet modifications.  I we did discuss basic of Mediterranean diet.  I discovered that he eats a lot of red meat and  he is planning to change this after conversation with me.  Eventuality of medication  will be decided after coronary CT angio will be done. 3. Dyspnea on exertion that would make me concerned most.  We will do coronary CT angio to rule out any significant coronary artery disease.   Medication Adjustments/Labs and Tests Ordered: Current medicines are reviewed at length with the patient today.  Concerns regarding medicines are outlined above.  No orders of the defined types were placed in this encounter.  No orders of the defined types were placed in this encounter.   Signed, Park Liter, MD, [facility]. 03/06/2020 9:22 AM    [facility]

## 2020-03-06 NOTE — Patient Instructions (Addendum)
Medication Instructions:  Your physician recommends that you continue on your current medications as directed. Please refer to the Current Medication list given to you today.  *If you need a refill on your cardiac medications before your next appointment, please call your pharmacy*   Lab Work: None.  If you have labs (blood work) drawn today and your tests are completely normal, you will receive your results only by: Marland Kitchen MyChart Message (if you have MyChart) OR . A paper copy in the mail If you have any lab test that is abnormal or we need to change your treatment, we will call you to review the results.   Testing/Procedures: Your cardiac CT will be scheduled at one of the below locations:   [facility] [address] [place], [place] [address] [phone]  [facility] [address] [place], [place] [address] [phone]  If scheduled at [facility], please arrive at the [facility] main entrance of [facility] 30 minutes prior to test start time. Proceed to the [facility] Radiology Department (first floor) to check-in and test prep.  If scheduled at [facility], please arrive 15 mins early for check-in and test prep.  Please follow these instructions carefully (unless otherwise directed):  Hold all erectile dysfunction medications at least 3 days (72 hrs) prior to test.  On the Night Before the Test: . Be sure to Drink plenty of water. . Do not consume any caffeinated/decaffeinated beverages or chocolate 12 hours prior to your test. . Do not take any antihistamines 12 hours prior to your test. .  On the Day of the Test: . Drink plenty of water. Do not drink any water within one hour of the test. . Do not eat any food 4 hours prior to the test. . You may take your regular medications prior to the test.  . Take metoprolol (Lopressor) two hours prior to  test.            After the Test: . Drink plenty of water. . After receiving IV contrast, you may experience a mild flushed feeling. This is normal. . On occasion, you may experience a mild rash up to 24 hours after the test. This is not dangerous. If this occurs, you can take Benadryl 25 mg and increase your fluid intake. . If you experience trouble breathing, this can be serious. If it is severe call 911 IMMEDIATELY. If it is mild, please call our office. . If you take any of these medications: Glipizide/Metformin, Avandament, Glucavance, please do not take 48 hours after completing test unless otherwise instructed.   Once we have confirmed authorization from your insurance company, we will call you to set up a date and time for your test. Based on how quickly your insurance processes prior authorizations requests, please allow up to 4 weeks to be contacted for scheduling your Cardiac CT appointment. Be advised that routine Cardiac CT appointments could be scheduled as many as 8 weeks after your provider has ordered it.  For non-scheduling related questions, please contact the cardiac imaging nurse navigator should you have any questions/concerns: Marchia Bond, Cardiac Imaging Nurse Navigator Burley Saver, Interim Cardiac Imaging Nurse [place] and [facility] Services Direct Office Dial: [phone]   For scheduling needs, including cancellations and rescheduling, please call Vivien Rota at [phone], option 3.      Follow-Up: At [facility], you and your health needs are our priority.  As part of our continuing mission to provide you with exceptional heart care, we have created designated Provider Care Teams.  These Care Teams include your primary Cardiologist (physician) and Advanced Practice Providers (APPs -  Physician Assistants and Nurse Practitioners) who all work together to provide you with the care you need, when you need it.  We recommend signing up for the  patient portal called "MyChart".  Sign up information is provided on this After Visit Summary.  MyChart is used to connect with patients for Virtual Visits (Telemedicine).  Patients are able to view lab/test results, encounter notes, upcoming appointments, etc.  Non-urgent messages can be sent to your provider as well.   To learn more about what you can do with MyChart, go to [email or web address].    Your next appointment:   3 month(s)  The format for your next appointment:   In Person  Provider:   Jenne Campus, MD   Other Instructions   Cardiac CT Angiogram A cardiac CT angiogram is a procedure to look at the heart and the area around the heart. It may be done to help find the cause of chest pains or other symptoms of heart disease. During this procedure, a substance called contrast dye is injected into the blood vessels in the area to be checked. A large X-ray machine, called a CT scanner, then takes detailed pictures of the heart and the surrounding area. The procedure is also sometimes called a coronary CT angiogram, coronary artery scanning, or CTA. A cardiac CT angiogram allows the health care provider to see how well blood is flowing to and from the heart. The health care provider will be able to see if there are any problems, such as:  Blockage or narrowing of the coronary arteries in the heart.  Fluid around the heart.  Signs of weakness or disease in the muscles, valves, and tissues of the heart. Tell a health care provider about:  Any allergies you have. This is especially important if you have had a previous allergic reaction to contrast dye.  All medicines you are taking, including vitamins, herbs, eye drops, creams, and over-the-counter medicines.  Any blood disorders you have.  Any surgeries you have had.  Any medical conditions you have.  Whether you are pregnant or may be pregnant.  Any anxiety disorders, chronic pain, or other conditions you have that  may increase your stress or prevent you from lying still. What are the risks? Generally, this is a safe procedure. However, problems may occur, including:  Bleeding.  Infection.  Allergic reactions to medicines or dyes.  Damage to other structures or organs.  Kidney damage from the contrast dye that is used.  Increased risk of cancer from radiation exposure. This risk is low. Talk with your health care provider about: ? The risks and benefits of testing. ? How you can receive the lowest dose of radiation. What happens before the procedure?  Wear comfortable clothing and remove any jewelry, glasses, dentures, and hearing aids.  Follow instructions from your health care provider about eating and drinking. This may include: ? For 12 hours before the procedure -- avoid caffeine. This includes tea, coffee, soda, energy drinks, and diet pills. Drink plenty of water or other fluids that do not have caffeine in them. Being well hydrated can prevent complications. ? For 4-6 hours before the procedure -- stop eating and drinking. The contrast dye can cause nausea, but this is less likely if your stomach is empty.  Ask your  health care provider about changing or stopping your regular medicines. This is especially important if you are taking diabetes medicines, blood thinners, or medicines to treat problems with erections (erectile dysfunction). What happens during the procedure?   Hair on your chest may need to be removed so that small sticky patches called electrodes can be placed on your chest. These will transmit information that helps to monitor your heart during the procedure.  An IV will be inserted into one of your veins.  You might be given a medicine to control your heart rate during the procedure. This will help to ensure that good images are obtained.  You will be asked to lie on an exam table. This table will slide in and out of the CT machine during the procedure.  Contrast dye  will be injected into the IV. You might feel warm, or you may get a metallic taste in your mouth.  You will be given a medicine called nitroglycerin. This will relax or dilate the arteries in your heart.  The table that you are lying on will move into the CT machine tunnel for the scan.  The person running the machine will give you instructions while the scans are being done. You may be asked to: ? Keep your arms above your head. ? Hold your breath. ? Stay very still, even if the table is moving.  When the scanning is complete, you will be moved out of the machine.  The IV will be removed. The procedure may vary among health care providers and hospitals. What can I expect after the procedure? After your procedure, it is common to have:  A metallic taste in your mouth from the contrast dye.  A feeling of warmth.  A headache from the nitroglycerin. Follow these instructions at home:  Take over-the-counter and prescription medicines only as told by your health care provider.  If you are told, drink enough fluid to keep your urine pale yellow. This will help to flush the contrast dye out of your body.  Most people can return to their normal activities right after the procedure. Ask your health care provider what activities are safe for you.  It is up to you to get the results of your procedure. Ask your health care provider, or the department that is doing the procedure, when your results will be ready.  Keep all follow-up visits as told by your health care provider. This is important. Contact a health care provider if:  You have any symptoms of allergy to the contrast dye. These include: ? Shortness of breath. ? Rash or hives. ? A racing heartbeat. Summary  A cardiac CT angiogram is a procedure to look at the heart and the area around the heart. It may be done to help find the cause of chest pains or other symptoms of heart disease.  During this procedure, a large X-ray  machine, called a CT scanner, takes detailed pictures of the heart and the surrounding area after a contrast dye has been injected into blood vessels in the area.  Ask your health care provider about changing or stopping your regular medicines before the procedure. This is especially important if you are taking diabetes medicines, blood thinners, or medicines to treat erectile dysfunction.  If you are told, drink enough fluid to keep your urine pale yellow. This will help to flush the contrast dye out of your body. This information is not intended to replace advice given to you by your health care  provider. Make sure you discuss any questions you have with your health care provider. Document Revised: 01/09/2019 Document Reviewed: 01/09/2019 Elsevier Patient Education  [place]

## 2020-03-06 NOTE — Addendum Note (Signed)
Addended by: Senaida Ores on: 03/06/2020 09:50 AM   Modules accepted: Orders

## 2020-03-30 ENCOUNTER — Telehealth: Payer: BC Managed Care – PPO | Admitting: Family Medicine

## 2020-05-15 ENCOUNTER — Other Ambulatory Visit: Payer: Self-pay | Admitting: Family Medicine

## 2020-05-15 DIAGNOSIS — F418 Other specified anxiety disorders: Secondary | ICD-10-CM

## 2020-05-15 NOTE — Telephone Encounter (Signed)
Last OV 02/12/20 Last fill 12/03/19  #90/0

## 2020-06-12 ENCOUNTER — Ambulatory Visit: Payer: BC Managed Care – PPO | Admitting: Cardiology

## 2020-06-12 ENCOUNTER — Encounter: Payer: Self-pay | Admitting: Cardiology

## 2020-06-12 ENCOUNTER — Other Ambulatory Visit: Payer: Self-pay

## 2020-06-12 VITALS — BP 110/82 | HR 67 | Ht 74.0 in | Wt 209.0 lb

## 2020-06-12 DIAGNOSIS — R5383 Other fatigue: Secondary | ICD-10-CM

## 2020-06-12 DIAGNOSIS — R0789 Other chest pain: Secondary | ICD-10-CM

## 2020-06-12 DIAGNOSIS — R0600 Dyspnea, unspecified: Secondary | ICD-10-CM | POA: Diagnosis not present

## 2020-06-12 DIAGNOSIS — R0609 Other forms of dyspnea: Secondary | ICD-10-CM

## 2020-06-12 NOTE — Patient Instructions (Signed)
Medication Instructions:  °Your physician recommends that you continue on your current medications as directed. Please refer to the Current Medication list given to you today. ° °*If you need a refill on your cardiac medications before your next appointment, please call your pharmacy* ° ° °Lab Work: °None °If you have labs (blood work) drawn today and your tests are completely normal, you will receive your results only by: °MyChart Message (if you have MyChart) OR °A paper copy in the mail °If you have any lab test that is abnormal or we need to change your treatment, we will call you to review the results. ° ° °Testing/Procedures: °None ° ° °Follow-Up: °At [facility], you and your health needs are our priority.  As part of our continuing mission to provide you with exceptional heart care, we have created designated Provider Care Teams.  These Care Teams include your primary Cardiologist (physician) and Advanced Practice Providers (APPs -  Physician Assistants and Nurse Practitioners) who all work together to provide you with the care you need, when you need it. ° °We recommend signing up for the patient portal called "MyChart".  Sign up information is provided on this After Visit Summary.  MyChart is used to connect with patients for Virtual Visits (Telemedicine).  Patients are able to view lab/test results, encounter notes, upcoming appointments, etc.  Non-urgent messages can be sent to your provider as well.   °To learn more about what you can do with MyChart, go to [email or web address].   ° °Your next appointment:   °1 year(s) ° °The format for your next appointment:   °In Person ° °Provider:   °Robert Krasowski, MD  ° ° °Other Instructions °None ° °

## 2020-06-12 NOTE — Progress Notes (Signed)
Cardiology Office Note:    Date:  06/12/2020   ID:  Isaiah Cordova, DOB 07-16-1970, MRN [id number]  PCP:  Libby Maw, MD  Cardiologist:  Jenne Campus, MD    Referring MD: Libby Maw,*   Chief Complaint  Patient presents with  . Follow-up  I am doing perfectly fine  History of Present Illness:    Isaiah Cordova is a 50 y.o. male who was referred to [place] because of atypical chest pain/epigastric pain as well as some dyspnea on exertion we will talk about doing a coronary CT angiogram for some reason did not happen in the meantime he was appropriately treated for his GI symptoms he also modified some of his medications that he takes including fish oil and he is completely asymptomatic.  He does exercise on the regular basis and have no difficulty doing it last time he complained that he noticed some decrease in ability to exercise but overall seems to be doing well he is back to normal.  Denies have any chest pain tightness squeezing pressure burning chest no shortness of breath.  Past Medical History:  Diagnosis Date  . Androgen deficiency 11/07/2017  . Anxiety   . Atypical chest pain 02/12/2020  . Benign prostatic hyperplasia with weak urinary stream 02/12/2020  . Bronchitis 05/21/2018  . Cataract    bilateral removed  . Concussion   . Depression   . Depression with anxiety 02/07/2019  . Detached retina    2 right eye, 1 in left eye  . DOE (dyspnea on exertion) 02/12/2020  . Dyspnea on exertion 02/12/2020  . Elevated LDL cholesterol level 02/07/2019  . Fatigue 08/26/2013  . Food allergy 11/07/2017  . Gastric pain 10/16/2015  . Gastric ulcer   . Gastroesophageal reflux disease 10/16/2015  . GERD (gastroesophageal reflux disease)   . Gilbert's syndrome 01/24/2014  . Headache(784.0)    chronic  . Healthcare maintenance 06/16/2014  . Hemorrhoids, external   . History of retinal detachment 05/21/2018  . Hypercholesteremia   . Irritable bowel   .  Irritable bowel syndrome with diarrhea 11/07/2017  . Joint stiffness of both ankles 07/27/2018  . Joint stiffness of both knees 07/27/2018  . Left ankle injury     torn ligements x1  . Midepigastric pain 10/27/2015  . Need for influenza vaccination 02/12/2020  . Neuromuscular disorder (HCC)    RLS  . Rectal bleeding    history of, negative colonoscopy 2005-2006  . Right ankle injury    torn ligemnts x3  . RLS (restless legs syndrome) 11/16/2012    Past Surgical History:  Procedure Laterality Date  . CATARACT EXTRACTION, BILATERAL    . COLONOSCOPY    . fracture collar bone    . GAS INSERTION Right 04/22/2019   Procedure: Insertion Of Gas;  Surgeon: Sherlynn Stalls, MD;  Location: [place];  Service: Ophthalmology;  Laterality: Right;  . GAS/FLUID EXCHANGE Right 04/22/2019   Procedure: Gas/Fluid Exchange;  Surgeon: Sherlynn Stalls, MD;  Location: [place];  Service: Ophthalmology;  Laterality: Right;  . HERNIA REPAIR    . multiplr fractures around left eye    . NASAL SEPTUM SURGERY    . PARS PLANA VITRECTOMY Right 04/22/2019   Procedure: PARS PLANA VITRECTOMY WITH 25 GAUGE;  Surgeon: Sherlynn Stalls, MD;  Location: [place];  Service: Ophthalmology;  Laterality: Right;  . PHOTOCOAGULATION WITH LASER Right 04/22/2019   Procedure: Photocoagulation With Laser;  Surgeon: Sherlynn Stalls, MD;  Location: [place];  Service: Ophthalmology;  Laterality: Right;  . POLYPECTOMY    . RETINAL DETACHMENT SURGERY    . right fractured hand    . UPPER GASTROINTESTINAL ENDOSCOPY      Current Medications: Current Meds  Medication Sig  . Cyanocobalamin (B-12 PO) Take 1 tablet by mouth every morning.   Marland Kitchen FLUoxetine (PROZAC) 40 MG capsule TAKE 1 CAPSULE(40 MG) BY MOUTH DAILY  . Omega-3 Fatty Acids (FISH OIL PO) Take 1 capsule by mouth every morning.   . testosterone cypionate (DEPOTESTOTERONE CYPIONATE) 100 MG/ML injection INJECT 1.5 ML IN THE MUSCLE EVERY 14 DAYS  . timolol (TIMOPTIC) 0.5 % ophthalmic solution Place  0.5 drops into both eyes daily.     Allergies:   Dust mite extract, Lisdexamfetamine, Other, and Pollen extract   Social History   Socioeconomic History  . Marital status: Married    Spouse name: Andrey Campanile  . Number of children: 1  . Years of education: Not on file  . Highest education level: Not on file  Occupational History  . 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[employer]    Employer: [employer]  Tobacco Use  . Smoking status: Never Smoker  . Smokeless tobacco: Never Used  Vaping Use  . Vaping Use: Never used  Substance and Sexual Activity  . Alcohol use: Yes    Alcohol/week: 2.0 standard drinks    Types: 2 Cans of beer per week    Comment: per month  . Drug use: Never  . Sexual activity: Yes  Other Topics Concern  . Not on file  Social History Narrative   Regular exercise - yes   1 daughter 3 years old   Social Determinants of Corporate investment banker Strain: Not on file  Food Insecurity: Not on file  Transportation Needs: Not on file  Physical Activity: Not on file  Stress: Not on file  Social Connections: Not on file     Family History: The patient's family history includes Depression in his maternal grandfather and paternal grandfather; Diabetes in his father; Diabetes Mellitus II in his brother; Hyperlipidemia in his father; Hypertension in his father; Hypothyroidism in an other family member; Prostate cancer in his maternal grandfather. There is no history of Colon cancer, Esophageal cancer, Rectal cancer, or Stomach cancer. ROS:   Please see the history of present illness.    All 14 point review of systems negative except as described per history of present illness  EKGs/Labs/Other Studies Reviewed:      Recent Labs: 02/12/2020: ALT 15; BUN 17; Creatinine, Ser 1.01; Hemoglobin 14.4; Platelets 255.0; Potassium 4.5; Sodium 139; TSH 1.50  Recent Lipid Panel    Component Value Date/Time   CHOL 209 (H) 02/12/2020 0843   TRIG 133.0 02/12/2020 0843   HDL 44.70  02/12/2020 0843   CHOLHDL 5 02/12/2020 0843   VLDL 26.6 02/12/2020 0843   LDLCALC 138 (H) 02/12/2020 0843    Physical Exam:    VS:  BP 110/82 (BP Location: Right Arm, Patient Position: Sitting)   Pulse 67   Ht 6\' 2"  (1.88 m)   Wt 209 lb (94.8 kg)   SpO2 97%   BMI 26.83 kg/m     Wt Readings from Last 3 Encounters:  06/12/20 209 lb (94.8 kg)  03/06/20 208 lb 12.8 oz (94.7 kg)  02/12/20 209 lb 3.2 oz (94.9 kg)     GEN:  Well nourished, well developed in no acute distress HEENT: Normal NECK: No JVD; No carotid bruits LYMPHATICS: No lymphadenopathy CARDIAC: RRR, no murmurs,  no rubs, no gallops RESPIRATORY:  Clear to auscultation without rales, wheezing or rhonchi  ABDOMEN: Soft, non-tender, non-distended MUSCULOSKELETAL:  No edema; No deformity  SKIN: Warm and dry LOWER EXTREMITIES: no swelling NEUROLOGIC:  Alert and oriented x 3 PSYCHIATRIC:  Normal affect   ASSESSMENT:    1. Atypical chest pain   2. DOE (dyspnea on exertion)   3. Fatigue, unspecified type   4. Dyspnea on exertion    PLAN:    In order of problems listed above:  1. Atypical chest pain.  Pain is gone modification of medications has been made which make significant difference.  From the very beginning the symptoms sounds more GI like noncardiac.  He was scheduled to have coronary CT angio but for some reason did not happen he does not want to have this test done now.  We did also talk about potentially doing calcium score however he does not want to do it he said he is feeling well and he does not think he required this and honestly I have low level suspicion there is some significant problem if we do do a test that to be only for prognostic reasons.  Therefore, we will simply monitor him I asked him to let me know if he develop some chest pain or decreased ability to exercise. 2. Dyspnea on exertion he said to improve and doing well. 3. Fatigue: No clear-cut explanation for this has been  identified. 4. Dyslipidemia: Last time we calculated his 10 years risk which was only 3.2.  No need to treat.  We did spend time talking about need to exercise which she already does as well as good diet and I discussed basic of Mediterranean diet.  I see him back in 1 year or if everything is fine I told him that he will be seen on as-needed basis.   Medication Adjustments/Labs and Tests Ordered: Current medicines are reviewed at length with the patient today.  Concerns regarding medicines are outlined above.  No orders of the defined types were placed in this encounter.  Medication changes: No orders of the defined types were placed in this encounter.   Signed, Park Liter, MD, [facility] 06/12/2020 9:31 AM    [place]

## 2020-06-30 DIAGNOSIS — H35372 Puckering of macula, left eye: Secondary | ICD-10-CM | POA: Diagnosis not present

## 2020-07-07 DIAGNOSIS — H40053 Ocular hypertension, bilateral: Secondary | ICD-10-CM | POA: Diagnosis not present

## 2020-07-07 DIAGNOSIS — H35372 Puckering of macula, left eye: Secondary | ICD-10-CM | POA: Diagnosis not present

## 2020-07-07 DIAGNOSIS — H524 Presbyopia: Secondary | ICD-10-CM | POA: Diagnosis not present

## 2020-07-07 DIAGNOSIS — H26493 Other secondary cataract, bilateral: Secondary | ICD-10-CM | POA: Diagnosis not present

## 2020-08-11 DIAGNOSIS — H26491 Other secondary cataract, right eye: Secondary | ICD-10-CM | POA: Diagnosis not present

## 2020-11-07 DIAGNOSIS — Z03818 Encounter for observation for suspected exposure to other biological agents ruled out: Secondary | ICD-10-CM | POA: Diagnosis not present

## 2020-11-11 ENCOUNTER — Other Ambulatory Visit: Payer: Self-pay | Admitting: Family Medicine

## 2020-11-11 DIAGNOSIS — F418 Other specified anxiety disorders: Secondary | ICD-10-CM

## 2020-11-16 ENCOUNTER — Telehealth: Payer: Self-pay

## 2020-11-16 DIAGNOSIS — F418 Other specified anxiety disorders: Secondary | ICD-10-CM

## 2020-11-16 MED ORDER — FLUOXETINE HCL 40 MG PO CAPS: ORAL_CAPSULE | ORAL | 0 refills | Status: DC

## 2020-11-16 NOTE — Telephone Encounter (Signed)
Pt aware.

## 2020-11-16 NOTE — Telephone Encounter (Signed)
Last OV 02/12/2020 Last fill 05/17/20  #90/0

## 2020-12-02 DIAGNOSIS — M9901 Segmental and somatic dysfunction of cervical region: Secondary | ICD-10-CM | POA: Diagnosis not present

## 2020-12-02 DIAGNOSIS — M9904 Segmental and somatic dysfunction of sacral region: Secondary | ICD-10-CM | POA: Diagnosis not present

## 2020-12-02 DIAGNOSIS — M542 Cervicalgia: Secondary | ICD-10-CM | POA: Diagnosis not present

## 2020-12-02 DIAGNOSIS — M9903 Segmental and somatic dysfunction of lumbar region: Secondary | ICD-10-CM | POA: Diagnosis not present

## 2020-12-03 DIAGNOSIS — M9904 Segmental and somatic dysfunction of sacral region: Secondary | ICD-10-CM | POA: Diagnosis not present

## 2020-12-03 DIAGNOSIS — M9903 Segmental and somatic dysfunction of lumbar region: Secondary | ICD-10-CM | POA: Diagnosis not present

## 2020-12-03 DIAGNOSIS — M9901 Segmental and somatic dysfunction of cervical region: Secondary | ICD-10-CM | POA: Diagnosis not present

## 2020-12-03 DIAGNOSIS — M542 Cervicalgia: Secondary | ICD-10-CM | POA: Diagnosis not present

## 2020-12-08 DIAGNOSIS — M9901 Segmental and somatic dysfunction of cervical region: Secondary | ICD-10-CM | POA: Diagnosis not present

## 2020-12-08 DIAGNOSIS — M9904 Segmental and somatic dysfunction of sacral region: Secondary | ICD-10-CM | POA: Diagnosis not present

## 2020-12-08 DIAGNOSIS — M542 Cervicalgia: Secondary | ICD-10-CM | POA: Diagnosis not present

## 2020-12-08 DIAGNOSIS — M9903 Segmental and somatic dysfunction of lumbar region: Secondary | ICD-10-CM | POA: Diagnosis not present

## 2020-12-29 DIAGNOSIS — H43811 Vitreous degeneration, right eye: Secondary | ICD-10-CM | POA: Diagnosis not present

## 2020-12-29 DIAGNOSIS — H35372 Puckering of macula, left eye: Secondary | ICD-10-CM | POA: Diagnosis not present

## 2020-12-29 DIAGNOSIS — H35032 Hypertensive retinopathy, left eye: Secondary | ICD-10-CM | POA: Diagnosis not present

## 2021-02-08 DIAGNOSIS — M9901 Segmental and somatic dysfunction of cervical region: Secondary | ICD-10-CM | POA: Diagnosis not present

## 2021-02-08 DIAGNOSIS — M9905 Segmental and somatic dysfunction of pelvic region: Secondary | ICD-10-CM | POA: Diagnosis not present

## 2021-02-08 DIAGNOSIS — M9904 Segmental and somatic dysfunction of sacral region: Secondary | ICD-10-CM | POA: Diagnosis not present

## 2021-02-08 DIAGNOSIS — M9903 Segmental and somatic dysfunction of lumbar region: Secondary | ICD-10-CM | POA: Diagnosis not present

## 2021-02-09 DIAGNOSIS — M9904 Segmental and somatic dysfunction of sacral region: Secondary | ICD-10-CM | POA: Diagnosis not present

## 2021-02-09 DIAGNOSIS — M9903 Segmental and somatic dysfunction of lumbar region: Secondary | ICD-10-CM | POA: Diagnosis not present

## 2021-02-09 DIAGNOSIS — M9905 Segmental and somatic dysfunction of pelvic region: Secondary | ICD-10-CM | POA: Diagnosis not present

## 2021-02-09 DIAGNOSIS — M9901 Segmental and somatic dysfunction of cervical region: Secondary | ICD-10-CM | POA: Diagnosis not present

## 2021-02-12 DIAGNOSIS — H40053 Ocular hypertension, bilateral: Secondary | ICD-10-CM | POA: Diagnosis not present

## 2021-02-12 DIAGNOSIS — H04123 Dry eye syndrome of bilateral lacrimal glands: Secondary | ICD-10-CM | POA: Diagnosis not present

## 2021-02-19 ENCOUNTER — Other Ambulatory Visit: Payer: Self-pay | Admitting: Family Medicine

## 2021-02-19 DIAGNOSIS — F418 Other specified anxiety disorders: Secondary | ICD-10-CM

## 2021-04-28 ENCOUNTER — Ambulatory Visit: Payer: BC Managed Care – PPO | Admitting: Family Medicine

## 2021-04-28 ENCOUNTER — Encounter: Payer: Self-pay | Admitting: Family Medicine

## 2021-04-28 ENCOUNTER — Other Ambulatory Visit: Payer: Self-pay

## 2021-04-28 VITALS — BP 126/82 | HR 59 | Temp 97.6°F | Ht 74.0 in | Wt 210.8 lb

## 2021-04-28 DIAGNOSIS — R197 Diarrhea, unspecified: Secondary | ICD-10-CM | POA: Diagnosis not present

## 2021-04-28 DIAGNOSIS — K921 Melena: Secondary | ICD-10-CM | POA: Diagnosis not present

## 2021-04-28 DIAGNOSIS — K5790 Diverticulosis of intestine, part unspecified, without perforation or abscess without bleeding: Secondary | ICD-10-CM | POA: Insufficient documentation

## 2021-04-28 DIAGNOSIS — R1084 Generalized abdominal pain: Secondary | ICD-10-CM | POA: Diagnosis not present

## 2021-04-28 NOTE — Progress Notes (Signed)
[facility] [address] [place] [address] Dept: [phone] Dept Fax: [phone]  Office Visit  Subjective:    Patient ID: Isaiah Cordova, male    DOB: 1970/10/29, 50 y.o..   MRN: [id number]  Chief Complaint  Patient presents with   Acute Visit    C/o having stomach discomfort/diarrhea x 2 weeks, feeling fatigue, and dizzy.   No OTC meds.     History of Present Illness:  Patient is in today complaining of an "annoying" generalized abdominal pain that he notes feels like hunger pains. He notes his bowels seem hyperactive. He has been having frequent diarrhea (9-10 stools today). He notes his rectum feels raw and he has seen some small specks of blood in his stool. He has not run fever. Mr. Wilsey has a history of IBS with diarrhea, but notes this has been specific to certain foods (dairy, tomatoes). His current diarrhea seems to be with anything he eats. He has noted a greasy quality to his diarrhea. He had a past colonoscopy about 2 years ago that he thinks was normal.  Past Medical History: Patient Active Problem List   Diagnosis Date Noted   Right ankle injury    Rectal bleeding    Neuromuscular disorder ([place])    Left ankle injury    Irritable bowel    Hypercholesteremia    Hemorrhoids, external    GERD (gastroesophageal reflux disease)    Gastric ulcer    Detached retina    Depression    Cataract    Anxiety    Benign prostatic hyperplasia with weak urinary stream 02/12/2020   Atypical chest pain 02/12/2020   Dyspnea on exertion 02/12/2020   Need for influenza vaccination 02/12/2020   DOE (dyspnea on exertion) 02/12/2020   Elevated LDL cholesterol level 02/07/2019   Depression with anxiety 02/07/2019   Joint stiffness of both knees 07/27/2018   Joint stiffness of both ankles 07/27/2018   Bronchitis 05/21/2018   History of retinal detachment 05/21/2018   Androgen deficiency 11/07/2017   Irritable  bowel syndrome with diarrhea 11/07/2017   Food allergy 11/07/2017   Gastroesophageal reflux disease 10/16/2015   Gastric pain 10/16/2015   Healthcare maintenance 06/16/2014   Gilbert's syndrome 01/24/2014   Fatigue 08/26/2013   RLS (restless legs syndrome) 11/16/2012   Past Surgical History:  Procedure Laterality Date   CATARACT EXTRACTION, BILATERAL     COLONOSCOPY     fracture collar bone     GAS INSERTION Right 04/22/2019   Procedure: Insertion Of Gas;  Surgeon: Sherlynn Stalls, MD;  Location: [place];  Service: Ophthalmology;  Laterality: Right;   GAS/FLUID EXCHANGE Right 04/22/2019   Procedure: Gas/Fluid Exchange;  Surgeon: Sherlynn Stalls, MD;  Location: [place];  Service: Ophthalmology;  Laterality: Right;   HERNIA REPAIR     multiplr fractures around left eye     NASAL SEPTUM SURGERY     PARS PLANA VITRECTOMY Right 04/22/2019   Procedure: PARS PLANA VITRECTOMY WITH 25 GAUGE;  Surgeon: Sherlynn Stalls, MD;  Location: [place];  Service: Ophthalmology;  Laterality: Right;   PHOTOCOAGULATION WITH LASER Right 04/22/2019   Procedure: Photocoagulation With Laser;  Surgeon: Sherlynn Stalls, MD;  Location: [facility];  Service: Ophthalmology;  Laterality: Right;   POLYPECTOMY     RETINAL DETACHMENT SURGERY     right fractured hand     UPPER GASTROINTESTINAL ENDOSCOPY     Family History  Problem Relation Age of Onset   Hypertension Father  Diabetes Father    Hyperlipidemia Father    Depression Maternal Grandfather        prostate   Prostate cancer Maternal Grandfather    Depression Paternal Grandfather        committed suicide   Hypothyroidism Other    Diabetes Mellitus II Brother    Colon cancer Neg Hx    Esophageal cancer Neg Hx    Rectal cancer Neg Hx    Stomach cancer Neg Hx    Outpatient Medications Prior to Visit  Medication Sig Dispense Refill   FLUoxetine (PROZAC) 40 MG capsule TAKE 1 CAPSULE(40 MG) BY MOUTH DAILY 30 capsule 1   Omega-3 Fatty Acids (FISH OIL PO) Take 1  capsule by mouth every morning.      timolol (TIMOPTIC) 0.5 % ophthalmic solution Place 0.5 drops into both eyes daily.     Cyanocobalamin (B-12 PO) Take 1 tablet by mouth every morning.  (Patient not taking: Reported on 04/28/2021)     testosterone cypionate (DEPOTESTOTERONE CYPIONATE) 100 MG/ML injection INJECT 1.5 ML IN THE MUSCLE EVERY 14 DAYS (Patient not taking: Reported on 04/28/2021) 10 mL 2   No facility-administered medications prior to visit.   Allergies  Allergen Reactions   Dust Mite Extract Other (See Comments)    sneezing   Lisdexamfetamine Other (See Comments)    REACTION: chest pains   Other Other (See Comments)    GRASS causes sneezing   Pollen Extract Other (See Comments)    sneezing    Objective:   Today's Vitals   04/28/21 1356  BP: 126/82  Pulse: (!) 59  Temp: 97.6 F (36.4 C)  TempSrc: Temporal  SpO2: 99%  Weight: 210 lb 12.8 oz (95.6 kg)  Height: 6\' 2"  (1.88 m)   Body mass index is 27.07 kg/m.   General: Well developed, well nourished. No acute distress. Abdomen: Soft. Generalized tenderness without guarding. Pain may be more in RUQ   and LLQ. Bowel sounds positive, normal pitch and frequency. No hepatosplenomegaly.   No rebound. Psych: Alert and oriented x3. Normal mood and affect.  Health Maintenance Due  Topic Date Due   Hepatitis C Screening  Never done   COVID-19 Vaccine (3 - Booster for Pfizer series) 11/02/2019   Zoster Vaccines- Shingrix (1 of 2) Never done     Assessment & Plan:   1. Generalized abdominal pain It is unclear as tot he etiology of his abdominal pain. There are aspects, such as the greasy stools, that suggest possible gallbladder disease. I will check labs to look for potential sources of the pain and an ultrasound to assess for gallbladder disease. If the ultrasound is normal, I would consider an abdominal CT scan to assess for other potential causes. I reviewed his colonoscopy results from last year, which did show some  left colonic diverticula. Diverticulitis could be present. In the meantime, I recommended he avoid fatty foods, go light with his diet, and push fluids.  - Comprehensive metabolic panel - CBC - Lipase - Urinalysis w microscopic + reflex cultur - US Abdomen Limited RUQ (LIVER/GB); Future  2. Diarrhea, unspecified type As above. I recommended he try Imodium to see if this will improve.  - Comprehensive metabolic panel  3. Hematochezia Possibly due to known hemorrhoids. Had a colonoscopy last year. If bleeding persists, would consider if he needs to return to GI.  - CBC  Haydee Salter, MD

## 2021-04-29 LAB — URINALYSIS W MICROSCOPIC + REFLEX CULTURE
Bacteria, UA: NONE SEEN /HPF
Bilirubin Urine: NEGATIVE
Glucose, UA: NEGATIVE
Hgb urine dipstick: NEGATIVE
Hyaline Cast: NONE SEEN /LPF
Ketones, ur: NEGATIVE
Leukocyte Esterase: NEGATIVE
Nitrites, Initial: NEGATIVE
Protein, ur: NEGATIVE
RBC / HPF: NONE SEEN /HPF (ref 0–2)
Specific Gravity, Urine: 1.021 (ref 1.001–1.035)
Squamous Epithelial / HPF: NONE SEEN /HPF (ref <=5)
WBC, UA: NONE SEEN /HPF (ref 0–5)
pH: 7 (ref 5.0–8.0)

## 2021-04-29 LAB — COMPREHENSIVE METABOLIC PANEL
ALT: 15 U/L (ref 0–53)
AST: 20 U/L (ref 0–37)
Albumin: 4.5 g/dL (ref 3.5–5.2)
Alkaline Phosphatase: 71 U/L (ref 39–117)
BUN: 14 mg/dL (ref 6–23)
CO2: 29 mEq/L (ref 19–32)
Calcium: 9.4 mg/dL (ref 8.4–10.5)
Chloride: 102 mEq/L (ref 96–112)
Creatinine, Ser: 0.93 mg/dL (ref 0.40–1.50)
GFR: 95.31 mL/min (ref >60.00)
Glucose, Bld: 82 mg/dL (ref 70–99)
Potassium: 4.3 mEq/L (ref 3.5–5.1)
Sodium: 136 mEq/L (ref 135–145)
Total Bilirubin: 1.9 mg/dL — ABNORMAL HIGH (ref 0.2–1.2)
Total Protein: 7.1 g/dL (ref 6.0–8.3)

## 2021-04-29 LAB — CBC
HCT: 43.3 % (ref 39.0–52.0)
Hemoglobin: 14.4 g/dL (ref 13.0–17.0)
MCHC: 33.4 g/dL (ref 30.0–36.0)
MCV: 89.9 fl (ref 78.0–100.0)
Platelets: 264 10*3/uL (ref 150.0–400.0)
RBC: 4.81 Mil/uL (ref 4.22–5.81)
RDW: 12.8 % (ref 11.5–15.5)
WBC: 5.7 10*3/uL (ref 4.0–10.5)

## 2021-04-29 LAB — LIPASE: Lipase: 16 U/L (ref 11.0–59.0)

## 2021-04-29 LAB — NO CULTURE INDICATED

## 2021-05-05 ENCOUNTER — Ambulatory Visit
Admission: RE | Admit: 2021-05-05 | Discharge: 2021-05-05 | Disposition: A | Discharge status: Home / Self Care | Payer: BC Managed Care – PPO | Source: Ambulatory Visit | Attending: Family Medicine | Admitting: Family Medicine

## 2021-05-05 ENCOUNTER — Other Ambulatory Visit: Payer: Self-pay | Admitting: Family Medicine

## 2021-05-05 DIAGNOSIS — K7689 Other specified diseases of liver: Secondary | ICD-10-CM | POA: Diagnosis not present

## 2021-05-05 DIAGNOSIS — R1084 Generalized abdominal pain: Secondary | ICD-10-CM

## 2021-05-05 DIAGNOSIS — F418 Other specified anxiety disorders: Secondary | ICD-10-CM

## 2021-05-11 ENCOUNTER — Telehealth: Payer: Self-pay | Admitting: Family Medicine

## 2021-05-11 ENCOUNTER — Encounter: Payer: Self-pay | Admitting: Family Medicine

## 2021-05-11 DIAGNOSIS — K760 Fatty (change of) liver, not elsewhere classified: Secondary | ICD-10-CM | POA: Insufficient documentation

## 2021-05-11 DIAGNOSIS — R1084 Generalized abdominal pain: Secondary | ICD-10-CM

## 2021-05-11 DIAGNOSIS — K921 Melena: Secondary | ICD-10-CM

## 2021-05-11 DIAGNOSIS — R197 Diarrhea, unspecified: Secondary | ICD-10-CM

## 2021-05-11 NOTE — Telephone Encounter (Signed)
Patient had OV with Dr. Gena Fray on 04/28/21 ultrasound and labs were ordered patient calling for results. Patient verbally understood results have not been resulted as of now but will be sent to doctor for review on both. Please advise.

## 2021-05-11 NOTE — Telephone Encounter (Signed)
Pt is needing a call back concerning his most recent lab results. Please advise at [phone].

## 2021-05-12 NOTE — Telephone Encounter (Signed)
Noted. Dm/cma  

## 2021-05-19 ENCOUNTER — Other Ambulatory Visit: Payer: Self-pay

## 2021-05-20 ENCOUNTER — Encounter: Payer: Self-pay | Admitting: Family Medicine

## 2021-05-20 ENCOUNTER — Ambulatory Visit: Payer: BC Managed Care – PPO | Admitting: Family Medicine

## 2021-05-20 VITALS — BP 122/82 | HR 69 | Temp 97.4°F | Ht 74.0 in | Wt 206.0 lb

## 2021-05-20 DIAGNOSIS — J069 Acute upper respiratory infection, unspecified: Secondary | ICD-10-CM | POA: Diagnosis not present

## 2021-05-20 LAB — POCT INFLUENZA A/B
Influenza A, POC: NEGATIVE
Influenza B, POC: NEGATIVE

## 2021-05-20 MED ORDER — BENZONATATE 100 MG PO CAPS: 100.0000 mg | ORAL_CAPSULE | Freq: Two times a day (BID) | ORAL | 0 refills | Status: DC | PRN

## 2021-05-20 NOTE — Progress Notes (Signed)
[facility] [address] [place] [address] Dept: [phone] Dept Fax: [phone]  Office Visit  Subjective:    Patient ID: Isaiah Cordova, male    DOB: 1971-03-04, 50 y.o..   MRN: [id number]  Chief Complaint  Patient presents with   Cough    Cough, sore throat, body aches, no appetite symptoms x 5 days. Covid negative 2 days ago.     History of Present Illness:  Patient is in today for evaluation of a 5-day history of cough, sore throat, decreased taste and poor appetite. He notes that his weight is down, having no desire to eat. Isaiah Cordova does note some weight loss. He denies fever and any GI symptoms. He has been using OTC Dayguil, Nyquil, and Delsym but without much improvement. He performed a home COVID test 2 days ago, which was negative.  Past Medical History: Patient Active Problem List   Diagnosis Date Noted   Fatty liver 05/11/2021   Diverticulosis 04/28/2021   Right ankle injury    Rectal bleeding    Neuromuscular disorder ([place])    Left ankle injury    Irritable bowel    Hypercholesteremia    Hemorrhoids, external    GERD (gastroesophageal reflux disease)    Gastric ulcer    Detached retina    Depression    Cataract    Anxiety    Benign prostatic hyperplasia with weak urinary stream 02/12/2020   Atypical chest pain 02/12/2020   Dyspnea on exertion 02/12/2020   Need for influenza vaccination 02/12/2020   DOE (dyspnea on exertion) 02/12/2020   Elevated LDL cholesterol level 02/07/2019   Depression with anxiety 02/07/2019   Joint stiffness of both knees 07/27/2018   Joint stiffness of both ankles 07/27/2018   Bronchitis 05/21/2018   History of retinal detachment 05/21/2018   Androgen deficiency 11/07/2017   Irritable bowel syndrome with diarrhea 11/07/2017   Food allergy 11/07/2017   Gastroesophageal reflux disease 10/16/2015   Gastric pain 10/16/2015   Healthcare maintenance 06/16/2014    Gilbert's syndrome 01/24/2014   Fatigue 08/26/2013   RLS (restless legs syndrome) 11/16/2012   Past Surgical History:  Procedure Laterality Date   CATARACT EXTRACTION, BILATERAL     COLONOSCOPY     fracture collar bone     GAS INSERTION Right 04/22/2019   Procedure: Insertion Of Gas;  Surgeon: Sherlynn Stalls, MD;  Location: [place];  Service: Ophthalmology;  Laterality: Right;   GAS/FLUID EXCHANGE Right 04/22/2019   Procedure: Gas/Fluid Exchange;  Surgeon: Sherlynn Stalls, MD;  Location: [place];  Service: Ophthalmology;  Laterality: Right;   HERNIA REPAIR     multiplr fractures around left eye     NASAL SEPTUM SURGERY     PARS PLANA VITRECTOMY Right 04/22/2019   Procedure: PARS PLANA VITRECTOMY WITH 25 GAUGE;  Surgeon: Sherlynn Stalls, MD;  Location: [place];  Service: Ophthalmology;  Laterality: Right;   PHOTOCOAGULATION WITH LASER Right 04/22/2019   Procedure: Photocoagulation With Laser;  Surgeon: Sherlynn Stalls, MD;  Location: [place];  Service: Ophthalmology;  Laterality: Right;   POLYPECTOMY     RETINAL DETACHMENT SURGERY     right fractured hand     UPPER GASTROINTESTINAL ENDOSCOPY     Family History  Problem Relation Age of Onset   Hypertension Father    Diabetes Father    Hyperlipidemia Father    Depression Maternal Grandfather        prostate   Prostate cancer Maternal Grandfather  Depression Paternal Grandfather        committed suicide   Hypothyroidism Other    Diabetes Mellitus II Brother    Colon cancer Neg Hx    Esophageal cancer Neg Hx    Rectal cancer Neg Hx    Stomach cancer Neg Hx    Outpatient Medications Prior to Visit  Medication Sig Dispense Refill   FLUoxetine (PROZAC) 40 MG capsule TAKE 1 CAPSULE(40 MG) BY MOUTH DAILY 30 capsule 1   Omega-3 Fatty Acids (FISH OIL PO) Take 1 capsule by mouth every morning.      timolol (TIMOPTIC) 0.5 % ophthalmic solution Place 0.5 drops into both eyes daily.     No facility-administered medications prior to visit.    Allergies  Allergen Reactions   Dust Mite Extract Other (See Comments)    sneezing   Lisdexamfetamine Other (See Comments)    REACTION: chest pains   Other Other (See Comments)    GRASS causes sneezing   Pollen Extract Other (See Comments)    sneezing   Objective:   Today's Vitals   05/20/21 0905  BP: 122/82  Pulse: 69  Temp: (!) 97.4 F (36.3 C)  TempSrc: Temporal  SpO2: 96%  Weight: 206 lb (93.4 kg)  Height: 6\' 2"  (1.88 m)   Body mass index is 26.45 kg/m.   General: Well developed, well nourished. No acute distress. HEENT: Normocephalic, non-traumatic. Conjunctiva clear. External ears normal. EAC and TMs normal   bilaterally. Nose clear without congestion or rhinorrhea. Mucous membranes moist. Oropharynx clear.   Good dentition. Neck: Supple. No lymphadenopathy. No thyromegaly. Lungs: Clear to auscultation bilaterally. No wheezing, rales or rhonchi. CV: RRR without murmurs or rubs. Pulses 2+ bilaterally. Psych: Alert and oriented. Normal mood and affect.  Health Maintenance Due  Topic Date Due   Hepatitis C Screening  Never done   Zoster Vaccines- Shingrix (1 of 2) Never done   Lab Results POCT Influenza A/B: Negative.    Assessment & Plan:   1. Viral URI with cough Discussed home care for viral illness, including rest, pushing fluids, and OTC medications as needed for symptom relief. Recommend hot tea with honey for sore throat symptoms. Follow-up if needed for worsening or persistent symptoms.  - POCT Influenza A/B - benzonatate (TESSALON) 100 MG capsule; Take 1 capsule (100 mg total) by mouth 2 (two) times daily as needed for cough.  Dispense: 20 capsule; Refill: 0  Isaiah Salter, MD

## 2021-05-20 NOTE — Patient Instructions (Signed)

## 2021-07-26 ENCOUNTER — Other Ambulatory Visit: Payer: Self-pay | Admitting: Family Medicine

## 2021-07-26 ENCOUNTER — Other Ambulatory Visit: Payer: Self-pay | Admitting: Family

## 2021-07-26 DIAGNOSIS — F418 Other specified anxiety disorders: Secondary | ICD-10-CM

## 2021-09-24 ENCOUNTER — Ambulatory Visit: Payer: BC Managed Care – PPO | Admitting: Family Medicine

## 2021-09-24 ENCOUNTER — Encounter: Payer: Self-pay | Admitting: Family Medicine

## 2021-09-24 VITALS — BP 122/82 | HR 69 | Temp 96.9°F | Ht 74.0 in | Wt 208.6 lb

## 2021-09-24 DIAGNOSIS — Z0000 Encounter for general adult medical examination without abnormal findings: Secondary | ICD-10-CM | POA: Diagnosis not present

## 2021-09-24 DIAGNOSIS — K219 Gastro-esophageal reflux disease without esophagitis: Secondary | ICD-10-CM | POA: Diagnosis not present

## 2021-09-24 DIAGNOSIS — E291 Testicular hypofunction: Secondary | ICD-10-CM

## 2021-09-24 DIAGNOSIS — F418 Other specified anxiety disorders: Secondary | ICD-10-CM | POA: Diagnosis not present

## 2021-09-24 DIAGNOSIS — R197 Diarrhea, unspecified: Secondary | ICD-10-CM | POA: Diagnosis not present

## 2021-09-24 LAB — CBC
HCT: 43.6 % (ref 39.0–52.0)
Hemoglobin: 14.7 g/dL (ref 13.0–17.0)
MCHC: 33.6 g/dL (ref 30.0–36.0)
MCV: 88.8 fl (ref 78.0–100.0)
Platelets: 251 10*3/uL (ref 150.0–400.0)
RBC: 4.9 Mil/uL (ref 4.22–5.81)
RDW: 13.1 % (ref 11.5–15.5)
WBC: 5.4 10*3/uL (ref 4.0–10.5)

## 2021-09-24 LAB — URINALYSIS, ROUTINE W REFLEX MICROSCOPIC
Bilirubin Urine: NEGATIVE
Hgb urine dipstick: NEGATIVE
Ketones, ur: NEGATIVE
Leukocytes,Ua: NEGATIVE
Nitrite: NEGATIVE
RBC / HPF: NONE SEEN (ref 0–?)
Specific Gravity, Urine: 1.015 (ref 1.000–1.030)
Total Protein, Urine: NEGATIVE
Urine Glucose: NEGATIVE
Urobilinogen, UA: 0.2 (ref 0.0–1.0)
WBC, UA: NONE SEEN (ref 0–?)
pH: 7 (ref 5.0–8.0)

## 2021-09-24 LAB — LIPID PANEL
Cholesterol: 237 mg/dL — ABNORMAL HIGH (ref 0–200)
HDL: 50.8 mg/dL (ref >39.00)
LDL Cholesterol: 160 mg/dL — ABNORMAL HIGH (ref 0–99)
NonHDL: 185.73
Total CHOL/HDL Ratio: 5
Triglycerides: 128 mg/dL (ref 0.0–149.0)
VLDL: 25.6 mg/dL (ref 0.0–40.0)

## 2021-09-24 LAB — COMPREHENSIVE METABOLIC PANEL
ALT: 16 U/L (ref 0–53)
AST: 18 U/L (ref 0–37)
Albumin: 4.6 g/dL (ref 3.5–5.2)
Alkaline Phosphatase: 73 U/L (ref 39–117)
BUN: 19 mg/dL (ref 6–23)
CO2: 27 mEq/L (ref 19–32)
Calcium: 9.4 mg/dL (ref 8.4–10.5)
Chloride: 104 mEq/L (ref 96–112)
Creatinine, Ser: 0.92 mg/dL (ref 0.40–1.50)
GFR: 96.28 mL/min (ref >60.00)
Glucose, Bld: 86 mg/dL (ref 70–99)
Potassium: 3.9 mEq/L (ref 3.5–5.1)
Sodium: 139 mEq/L (ref 135–145)
Total Bilirubin: 1.4 mg/dL — ABNORMAL HIGH (ref 0.2–1.2)
Total Protein: 6.7 g/dL (ref 6.0–8.3)

## 2021-09-24 LAB — HEMOGLOBIN A1C: Hgb A1c MFr Bld: 5.4 % (ref 4.6–6.5)

## 2021-09-24 LAB — PSA: PSA: 0.69 ng/mL (ref 0.10–4.00)

## 2021-09-24 MED ORDER — FLUOXETINE HCL 20 MG PO CAPS: 20.0000 mg | ORAL_CAPSULE | Freq: Every day | ORAL | 3 refills | Status: DC

## 2021-09-24 MED ORDER — OMEPRAZOLE 40 MG PO CPDR: 40.0000 mg | DELAYED_RELEASE_CAPSULE | Freq: Every day | ORAL | 1 refills | Status: DC

## 2021-09-24 NOTE — Progress Notes (Signed)
? ?Established Patient Office Visit ? ?Subjective   ?Patient ID: Isaiah Cordova, male    DOB: 25-May-1971  Age: 51 y.o. MRN: [id number] ? ?Chief Complaint  ?Patient presents with  ? Follow-up  ?  Refills/follow up on medications, would like to decrease Prozac to '20mg'$ . Patient not fasting.   ? ? ?HPI here for a physical and follow-up on depression and anxiety.  Things have calmed down significantly for him.  Work is more stable.  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  Higher dose fluoxetine has been associated with reflux for him.  He feels as though he is in a good place to decrease the dosage to 20.  Testosterone did not seem to make much of a difference for him and he went ahead and discontinued it.  Daughter has tentatively been diagnosed with celiac disease.  Family has been consuming a low gluten diet and it seems to have helped his gastrointestinal symptoms.  He would like to be checked for celiac disease.  He is up-to-date on health maintenance.  Goes for dental care twice yearly.  His job has moved to [place].  He will be commuting a couple of days to work each week.  He is otherwise out on the road a lot. ? ? ? ?Review of Systems  ?Constitutional: Negative.   ?HENT: Negative.    ?Eyes:  Negative for blurred vision, discharge and redness.  ?Respiratory: Negative.    ?Cardiovascular: Negative.   ?Gastrointestinal:  Positive for heartburn. Negative for abdominal pain, blood in stool, constipation, diarrhea, melena, nausea and vomiting.  ?Genitourinary: Negative.   ?Musculoskeletal: Negative.  Negative for myalgias.  ?Skin:  Negative for rash.  ?Neurological:  Negative for tingling, loss of consciousness and weakness.  ?Endo/Heme/Allergies:  Negative for polydipsia.  ? ?  09/24/2021  ?  8:42 AM 09/24/2021  ?  8:13 AM 05/20/2021  ?  9:05 AM  ?Depression screen PHQ 2/9  ?Decreased Interest 0 0 0  ?Down, Depressed, Hopeless 0 0 0  ?PHQ - 2 Score 0 0 0  ?Altered sleeping 2    ?Tired,  decreased energy 1    ?Change in appetite 0    ?Feeling bad or failure about yourself  0    ?Trouble concentrating 0    ?Moving slowly or fidgety/restless 0    ?Suicidal thoughts 0    ?PHQ-9 Score 3    ?Difficult doing work/chores Not difficult at all    ? ? ? ?  ?Objective:  ?  ? ?BP 122/82 (BP Location: Right Arm, Patient Position: Sitting, Cuff Size: Normal)   Pulse 69   Temp (!) 96.9 ?F (36.1 ?C) (Temporal)   Ht '6\' 2"'$  (1.88 m)   Wt 208 lb 9.6 oz (94.6 kg)   SpO2 100%   BMI 26.78 kg/m?  ? ? ?Physical Exam ?Constitutional:   ?   General: He is not in acute distress. ?   Appearance: Normal appearance. He is not ill-appearing, toxic-appearing or diaphoretic.  ?HENT:  ?   Head: Normocephalic and atraumatic.  ?   Right Ear: External ear normal.  ?   Left Ear: External ear normal.  ?   Mouth/Throat:  ?   Mouth: Mucous membranes are moist.  ?   Pharynx: Oropharynx is clear. No oropharyngeal exudate or posterior oropharyngeal erythema.  ?Eyes:  ?   General: No scleral icterus.    ?   Right eye: No discharge.     ?  Left eye: No discharge.  ?   Extraocular Movements: Extraocular movements intact.  ?   Conjunctiva/sclera: Conjunctivae normal.  ?   Pupils: Pupils are equal, round, and reactive to light.  ?Cardiovascular:  ?   Rate and Rhythm: Normal rate and regular rhythm.  ?Pulmonary:  ?   Effort: Pulmonary effort is normal. No respiratory distress.  ?   Breath sounds: Normal breath sounds.  ?Abdominal:  ?   General: Abdomen is flat. Bowel sounds are normal. There is no distension.  ?   Palpations: Abdomen is soft.  ?   Tenderness: There is no abdominal tenderness. There is no guarding or rebound.  ?Musculoskeletal:  ?   Cervical back: No rigidity or tenderness.  ?Lymphadenopathy:  ?   Cervical: No cervical adenopathy.  ?Skin: ?   General: Skin is warm and dry.  ?Neurological:  ?   Mental Status: He is alert and oriented to person, place, and time.  ?Psychiatric:     ?   Mood and Affect: Mood normal.     ?    Behavior: Behavior normal.  ? ? ? ?No results found for any visits on 09/24/21. ? ? ? ?The 10-year ASCVD risk score (Arnett DK, et al., 2019) is: 4.2% ? ?  ?Assessment & Plan:  ? ?Problem List Items Addressed This Visit   ? ?  ? Digestive  ? Gastroesophageal reflux disease  ? Relevant Medications  ? omeprazole (PRILOSEC) 40 MG capsule  ? Other Relevant Orders  ? Gliadin antibodies, serum  ? Tissue transglutaminase, IgA  ? Reticulin Antibody, IgA w reflex titer  ?  ? Endocrine  ? Androgen deficiency  ?  ? Other  ? Healthcare maintenance  ? Relevant Orders  ? CBC  ? Comprehensive metabolic panel  ? Hemoglobin A1c  ? Lipid panel  ? PSA  ? Urinalysis, Routine w reflex microscopic  ? Depression with anxiety  ? Relevant Medications  ? FLUoxetine (PROZAC) 20 MG capsule  ? ?Other Visit Diagnoses   ? ? Diarrhea, unspecified type    -  Primary  ? Relevant Orders  ? Gliadin antibodies, serum  ? Tissue transglutaminase, IgA  ? Reticulin Antibody, IgA w reflex titer  ? ?  ? ? ?No follow-ups on file.  ? ?Have decreased the fluoxetine to 20 mg daily.  I suspect he will do well on the lower dose he will let me know if he does not.  Okay to discontinue testosterone if it did not seem to help.  Asked him to take omeprazole daily for a couple of months and then use as needed.  Information was given on health maintenance and disease prevention.  Encouraged him to continue daily workouts. ?Libby Maw, MD ? ?

## 2021-09-27 LAB — GLIADIN ANTIBODIES, SERUM
Gliadin IgA: <1 U/mL
Gliadin IgG: <1 U/mL

## 2021-09-27 LAB — TISSUE TRANSGLUTAMINASE, IGA: (tTG) Ab, IgA: <1 U/mL

## 2021-09-29 LAB — RETICULIN ANTIBODIES, IGA W TITER: Reticulin Ab, IgA: NEGATIVE titer (ref <2.5)

## 2021-11-19 DIAGNOSIS — H04123 Dry eye syndrome of bilateral lacrimal glands: Secondary | ICD-10-CM | POA: Diagnosis not present

## 2021-11-19 DIAGNOSIS — H26492 Other secondary cataract, left eye: Secondary | ICD-10-CM | POA: Diagnosis not present

## 2021-11-19 DIAGNOSIS — H40053 Ocular hypertension, bilateral: Secondary | ICD-10-CM | POA: Diagnosis not present

## 2021-11-19 DIAGNOSIS — H31003 Unspecified chorioretinal scars, bilateral: Secondary | ICD-10-CM | POA: Diagnosis not present

## 2022-03-18 ENCOUNTER — Ambulatory Visit (INDEPENDENT_AMBULATORY_CARE_PROVIDER_SITE_OTHER): Payer: BC Managed Care – PPO | Admitting: Family Medicine

## 2022-03-18 ENCOUNTER — Encounter: Payer: Self-pay | Admitting: Family Medicine

## 2022-03-18 VITALS — BP 118/76 | HR 74 | Temp 97.7°F | Ht 74.0 in | Wt 206.2 lb

## 2022-03-18 DIAGNOSIS — Z23 Encounter for immunization: Secondary | ICD-10-CM | POA: Diagnosis not present

## 2022-03-18 DIAGNOSIS — F418 Other specified anxiety disorders: Secondary | ICD-10-CM

## 2022-03-18 DIAGNOSIS — M7551 Bursitis of right shoulder: Secondary | ICD-10-CM

## 2022-03-18 DIAGNOSIS — G542 Cervical root disorders, not elsewhere classified: Secondary | ICD-10-CM | POA: Diagnosis not present

## 2022-03-18 DIAGNOSIS — K644 Residual hemorrhoidal skin tags: Secondary | ICD-10-CM

## 2022-03-18 MED ORDER — QUETIAPINE FUMARATE 25 MG PO TABS: 25.0000 mg | ORAL_TABLET | Freq: Every day | ORAL | 2 refills | Status: DC

## 2022-03-18 MED ORDER — HYDROCORTISONE ACE-PRAMOXINE 1-1 % EX CREA: 1.0000 | TOPICAL_CREAM | Freq: Two times a day (BID) | CUTANEOUS | 0 refills | Status: DC

## 2022-03-18 MED ORDER — PREDNISONE 10 MG (21) PO TBPK: ORAL_TABLET | ORAL | 0 refills | Status: DC

## 2022-03-18 MED ORDER — FLUOXETINE HCL 40 MG PO CAPS: 40.0000 mg | ORAL_CAPSULE | Freq: Every day | ORAL | 3 refills | Status: DC

## 2022-03-18 NOTE — Progress Notes (Unsigned)
Established Patient Office Visit  Subjective   Patient ID: Isaiah Cordova, male    DOB: [birth year]  Age: 51 y.o. MRN: [id number]  Chief Complaint  Patient presents with   Annual Exam    Fasting. Right shoulder pain x 1 year and mole of concern on his buttocks. Blood in stool off and on  with abdominal pain for x 2 weeks.     HPI for evaluation of right shoulder pain that also involves the tingling in the right arm.  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  His neck is often stiff.  He has noted blood when wiping after a bowel movement.  He denies constipation but does enjoy sitting on the toilet.  He has a history of hemorrhoids.  Colonoscopy is up-to-date.  He has a mole on his left upper cheek and would like me to check.  He has also been extremely stressed and anxious.  He has been abrupt and irritable with his wife.  He is not sleeping well at night.  He is increased his fluoxetine dosage to 40 mg daily over the last few weeks  {History (Optional):23778}  Review of Systems  Constitutional: Negative.   HENT: Negative.    Eyes:  Negative for blurred vision, discharge and redness.  Respiratory: Negative.    Cardiovascular: Negative.   Gastrointestinal:  Negative for abdominal pain.  Genitourinary: Negative.   Musculoskeletal:  Positive for joint pain and neck pain. Negative for myalgias.  Skin:  Negative for rash.  Neurological:  Positive for tingling. Negative for loss of consciousness and weakness.  Endo/Heme/Allergies:  Negative for polydipsia.  Psychiatric/Behavioral:  Positive for depression. The patient is nervous/anxious and has insomnia.       03/18/2022    2:57 PM 09/24/2021    8:42 AM 09/24/2021    8:13 AM  Depression screen PHQ 2/9  Decreased Interest 1 0 0  Down, Depressed, Hopeless 2 0 0  PHQ - 2 Score 3 0 0  Altered sleeping 3 2   Tired, decreased energy 2 1   Change in appetite 0 0   Feeling bad or failure about yourself  1 0    Trouble concentrating 2 0   Moving slowly or fidgety/restless 0 0   Suicidal thoughts 1 0   PHQ-9 Score 12 3   Difficult doing work/chores Somewhat difficult Not difficult at all        Objective:     BP 118/76 (BP Location: Left Arm, Patient Position: Sitting, Cuff Size: Large)   Pulse 74   Temp 97.7 F (36.5 C) (Temporal)   Ht '6\' 2"'$  (1.88 m)   Wt 206 lb 3.2 oz (93.5 kg)   SpO2 98%   BMI 26.47 kg/m  {Vitals History (Optional):23777}  Physical Exam Constitutional:      General: He is not in acute distress.    Appearance: Normal appearance. He is not ill-appearing, toxic-appearing or diaphoretic.  HENT:     Head: Normocephalic and atraumatic.     Right Ear: External ear normal.     Left Ear: External ear normal.  Eyes:     General: No scleral icterus.       Right eye: No discharge.        Left eye: No discharge.     Extraocular Movements: Extraocular movements intact.     Conjunctiva/sclera: Conjunctivae normal.  Pulmonary:     Effort: No respiratory distress.  Genitourinary:    Prostate: Not  enlarged, not tender and no nodules present.     Rectum: Guaiac result negative. No mass, tenderness, anal fissure, external hemorrhoid or internal hemorrhoid. Normal anal tone.  Musculoskeletal:     Right shoulder: Tenderness present. Normal range of motion.     Cervical back: Normal range of motion and neck supple. No spasms or bony tenderness. No pain with movement. Normal range of motion.     Comments: There is some pain down the cervical spine into the left with Spurling's to the left and right.   Right shoulder has full range of motion with difficulty.  There is tenderness to palpation of the subacromial bursa.  There is positive impingement testing.  Skin:    General: Skin is warm and dry.       Neurological:     Mental Status: He is alert and oriented to person, place, and time.  Psychiatric:        Mood and Affect: Mood normal.        Behavior: Behavior normal.       No results found for any visits on 03/18/22.  {Labs (Optional):23779}  The 10-year ASCVD risk score (Arnett DK, et al., 2019) is: 4.5%    Assessment & Plan:   Problem List Items Addressed This Visit       Cardiovascular and Mediastinum   Hemorrhoids, external   Relevant Medications   pramoxine-hydrocortisone (PROCTOCREAM-HC) 1-1 % rectal cream     Other   Need for influenza vaccination - Primary   Relevant Orders   Flu Vaccine QUAD 6+ mos PF IM (Fluarix Quad PF)   Depression with anxiety   Relevant Medications   QUEtiapine (SEROQUEL) 25 MG tablet   FLUoxetine (PROZAC) 40 MG capsule   Other Visit Diagnoses     Subacromial bursitis of right shoulder joint       Relevant Medications   predniSONE (STERAPRED UNI-PAK 21 TAB) 10 MG (21) TBPK tablet   Other Relevant Orders   Ambulatory referral to Orthopedic Surgery   Cervical neuropathy       Relevant Medications   QUEtiapine (SEROQUEL) 25 MG tablet   FLUoxetine (PROZAC) 40 MG capsule   predniSONE (STERAPRED UNI-PAK 21 TAB) 10 MG (21) TBPK tablet   Other Relevant Orders   Ambulatory referral to Orthopedic Surgery       Return in about 6 weeks (around 04/29/2022).  Increase fluoxetine to 40 mg.  Add low-dose quetiapine at nighttime.  6-day Dosepak for cervical neuropathy and subacromial bursitis.  Referral to orthopedic surgery.  Advised to avoid long toilet bowl sitting.  ProctoCream to be used 3 times daily.  Libby Maw, MD

## 2022-04-11 ENCOUNTER — Encounter: Payer: Self-pay | Admitting: Family Medicine

## 2022-05-06 ENCOUNTER — Ambulatory Visit: Payer: BC Managed Care – PPO | Admitting: Family Medicine

## 2022-05-12 DIAGNOSIS — M542 Cervicalgia: Secondary | ICD-10-CM | POA: Diagnosis not present

## 2022-05-12 DIAGNOSIS — M25511 Pain in right shoulder: Secondary | ICD-10-CM | POA: Diagnosis not present

## 2022-11-09 DIAGNOSIS — H26492 Other secondary cataract, left eye: Secondary | ICD-10-CM | POA: Diagnosis not present

## 2022-11-09 DIAGNOSIS — H40053 Ocular hypertension, bilateral: Secondary | ICD-10-CM | POA: Diagnosis not present

## 2022-11-09 DIAGNOSIS — Z961 Presence of intraocular lens: Secondary | ICD-10-CM | POA: Diagnosis not present

## 2022-11-09 DIAGNOSIS — H52203 Unspecified astigmatism, bilateral: Secondary | ICD-10-CM | POA: Diagnosis not present

## 2022-11-09 DIAGNOSIS — H524 Presbyopia: Secondary | ICD-10-CM | POA: Diagnosis not present

## 2022-11-09 DIAGNOSIS — H31003 Unspecified chorioretinal scars, bilateral: Secondary | ICD-10-CM | POA: Diagnosis not present

## 2023-04-27 IMAGING — US US ABDOMEN LIMITED
1 series · 13 of 25 positions shown · non-contrast
Comparison: 05/12/2010

CLINICAL DATA: Abdominal pain, diarrhea

EXAM:
ULTRASOUND ABDOMEN LIMITED RIGHT UPPER QUADRANT

[Series 1: us abdomen limited · 0.25mm/px · 13 of 90 slices shown]
[im 1/90]
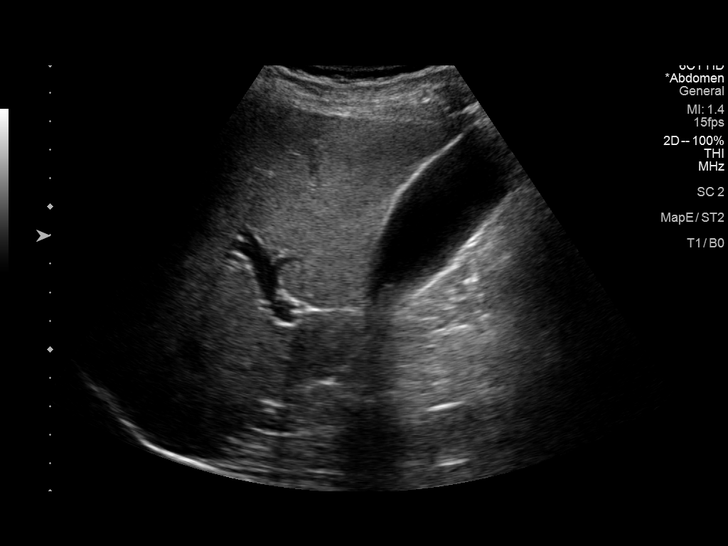
[im 8/90]
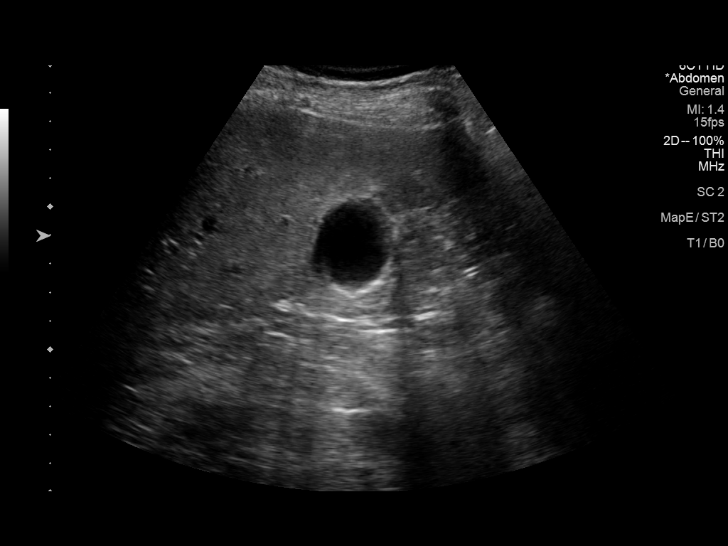
[im 15/90]
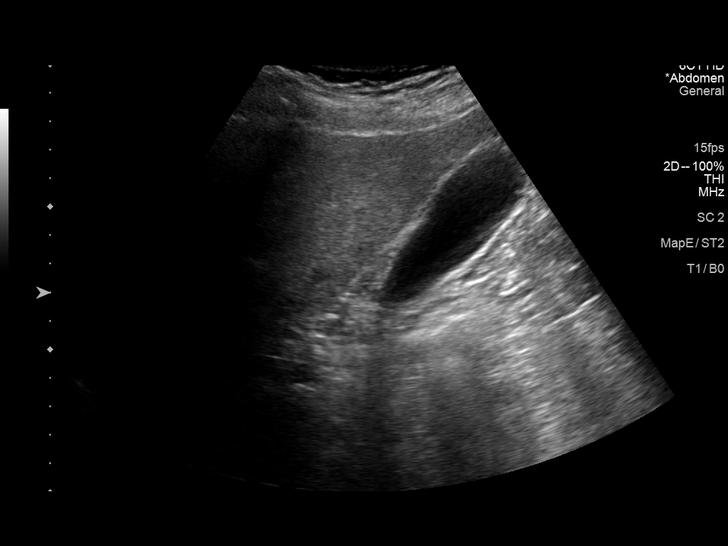
[im 23/90]
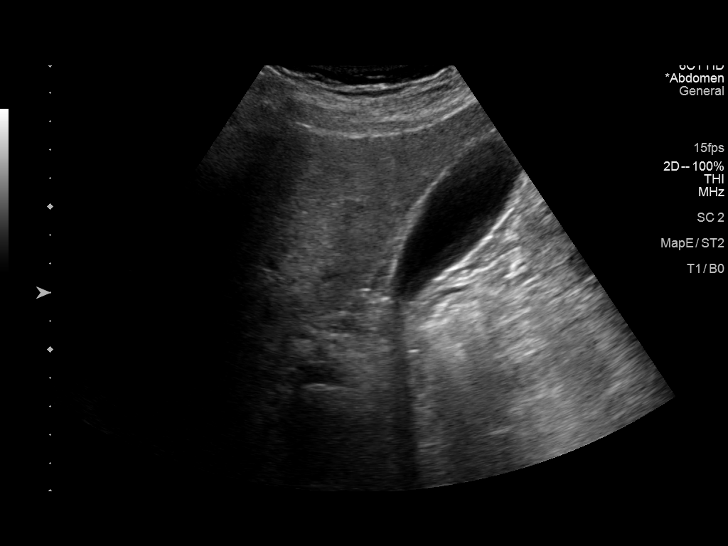
[im 30/90]
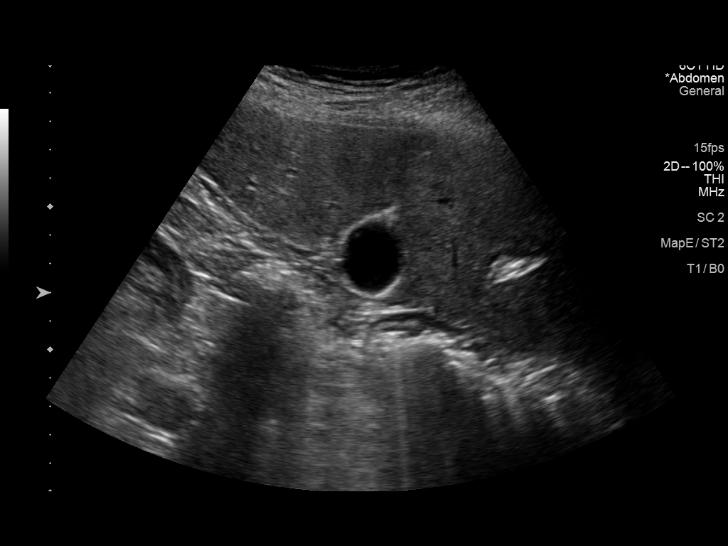
[im 38/90]
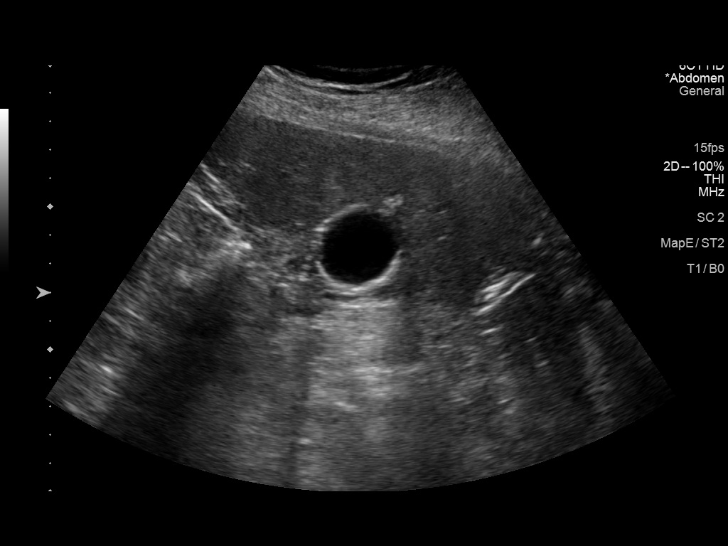
[im 45/90]
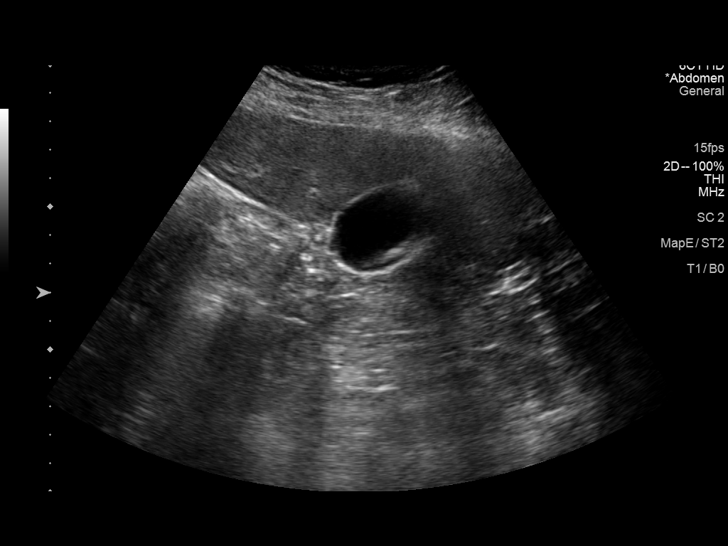
[im 52/90]
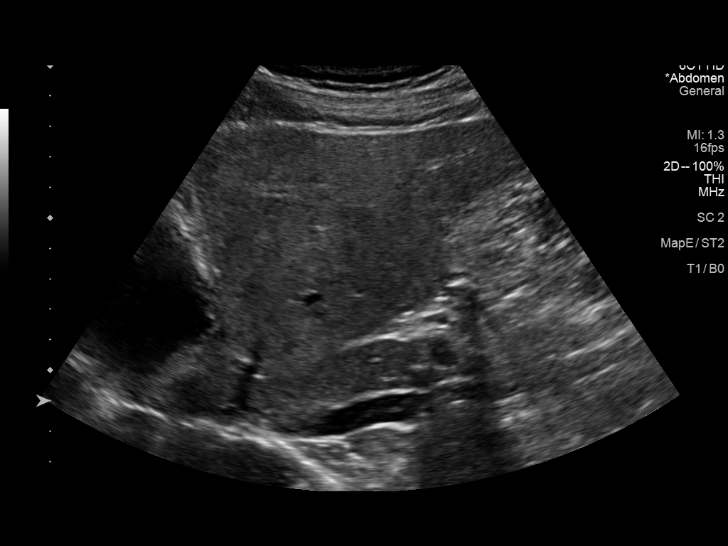
[im 60/90]
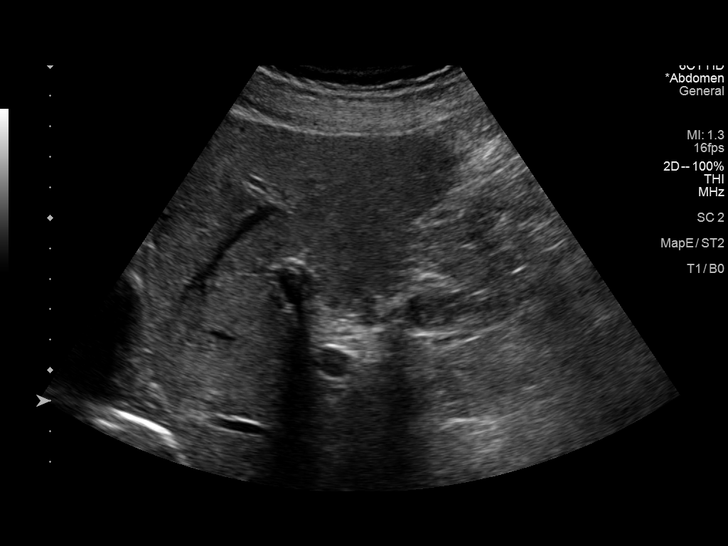
[im 67/90]
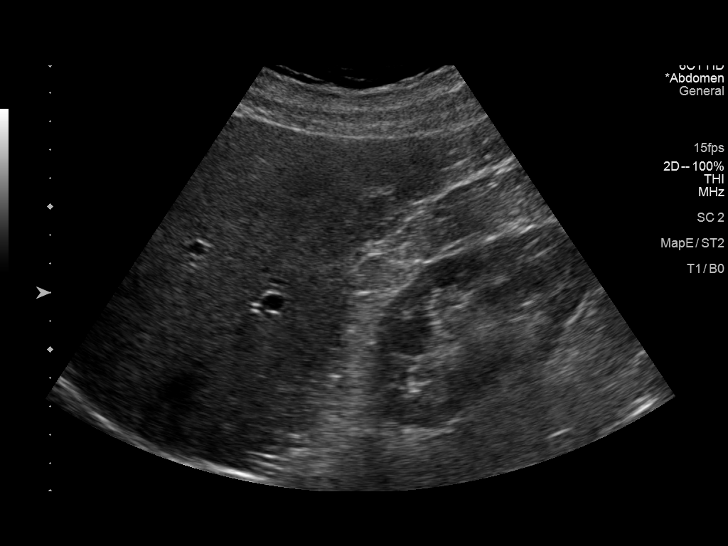
[im 75/90]
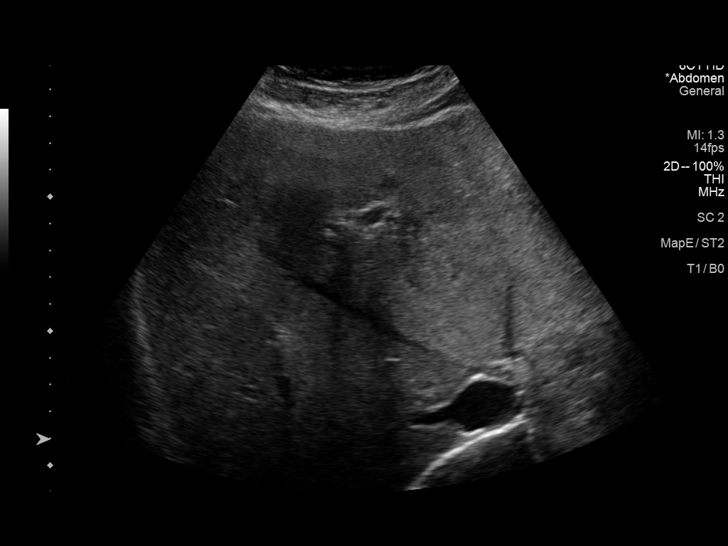
[im 82/90]
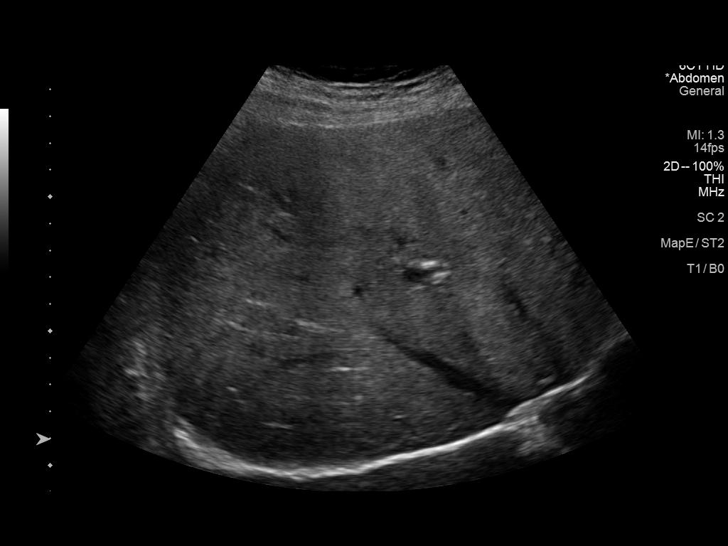
[im 90/90]
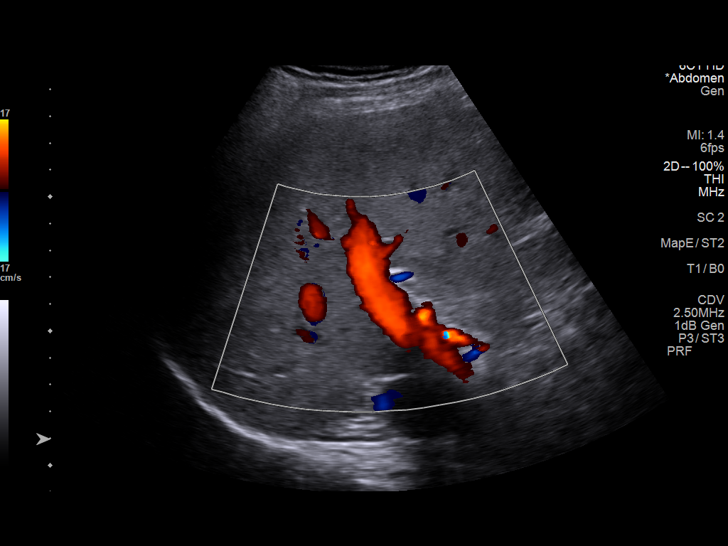

[13 of 25 positions shown; findings below may reference images not displayed]

FINDINGS: Gallbladder:

There is no significant wall thickening. Technologist did not
observe any tenderness over the gallbladder during the study.
Possible thickening of anterior wall of neck of the gallbladder seen
in the some of the images may be due to partial volume averaging
artifact. Technologist did not observe any tenderness over the
gallbladder during the study. There is possible sludge in the
dependent portion of gallbladder lumen.

Common bile duct:

Diameter: 3.3 mm

Liver:

There is inhomogeneous increased echogenicity. There are few small
hypoechoic foci each measuring less than 3.5 cm, possibly focal
sparing from fatty infiltration. Portal vein is patent on color
Doppler imaging with normal direction of blood flow towards the
liver.

Other: None.
IMPRESSION: There is inhomogeneous increased echogenicity liver suggesting fatty
infiltration. There are small foci of sparing from fatty
infiltration in the liver measuring up to 3.5 cm in maximum
diameter. No other significant sonographic abnormality is seen in
the right upper quadrant.

## 2023-05-09 ENCOUNTER — Encounter: Payer: Self-pay | Admitting: Family Medicine

## 2023-05-09 ENCOUNTER — Ambulatory Visit: Payer: BC Managed Care – PPO | Admitting: Family Medicine

## 2023-05-09 VITALS — BP 120/84 | HR 65 | Temp 97.6°F | Ht 74.0 in | Wt 216.8 lb

## 2023-05-09 DIAGNOSIS — K219 Gastro-esophageal reflux disease without esophagitis: Secondary | ICD-10-CM | POA: Diagnosis not present

## 2023-05-09 DIAGNOSIS — F418 Other specified anxiety disorders: Secondary | ICD-10-CM

## 2023-05-09 DIAGNOSIS — Z125 Encounter for screening for malignant neoplasm of prostate: Secondary | ICD-10-CM

## 2023-05-09 DIAGNOSIS — Z23 Encounter for immunization: Secondary | ICD-10-CM

## 2023-05-09 DIAGNOSIS — Z13220 Encounter for screening for lipoid disorders: Secondary | ICD-10-CM | POA: Diagnosis not present

## 2023-05-09 DIAGNOSIS — Z0001 Encounter for general adult medical examination with abnormal findings: Secondary | ICD-10-CM | POA: Diagnosis not present

## 2023-05-09 DIAGNOSIS — K921 Melena: Secondary | ICD-10-CM

## 2023-05-09 DIAGNOSIS — Z0000 Encounter for general adult medical examination without abnormal findings: Secondary | ICD-10-CM

## 2023-05-09 LAB — CBC WITH DIFFERENTIAL/PLATELET
Basophils Absolute: 0 10*3/uL (ref 0.0–0.1)
Basophils Relative: 0.8 % (ref 0.0–3.0)
Eosinophils Absolute: 0.3 10*3/uL (ref 0.0–0.7)
Eosinophils Relative: 5.3 % — ABNORMAL HIGH (ref 0.0–5.0)
HCT: 42.4 % (ref 39.0–52.0)
Hemoglobin: 14 g/dL (ref 13.0–17.0)
Lymphocytes Relative: 35 % (ref 12.0–46.0)
Lymphs Abs: 2.1 10*3/uL (ref 0.7–4.0)
MCHC: 33 g/dL (ref 30.0–36.0)
MCV: 90.1 fL (ref 78.0–100.0)
Monocytes Absolute: 0.5 10*3/uL (ref 0.1–1.0)
Monocytes Relative: 7.8 % (ref 3.0–12.0)
Neutro Abs: 3 10*3/uL (ref 1.4–7.7)
Neutrophils Relative %: 51.1 % (ref 43.0–77.0)
Platelets: 249 10*3/uL (ref 150.0–400.0)
RBC: 4.7 Mil/uL (ref 4.22–5.81)
RDW: 13.2 % (ref 11.5–15.5)
WBC: 6 10*3/uL (ref 4.0–10.5)

## 2023-05-09 LAB — URINALYSIS, ROUTINE W REFLEX MICROSCOPIC
Bilirubin Urine: NEGATIVE
Hgb urine dipstick: NEGATIVE
Ketones, ur: NEGATIVE
Leukocytes,Ua: NEGATIVE
Nitrite: NEGATIVE
RBC / HPF: NONE SEEN (ref 0–?)
Specific Gravity, Urine: 1.02 (ref 1.000–1.030)
Total Protein, Urine: NEGATIVE
Urine Glucose: NEGATIVE
Urobilinogen, UA: 0.2 (ref 0.0–1.0)
WBC, UA: NONE SEEN (ref 0–?)
pH: 6 (ref 5.0–8.0)

## 2023-05-09 NOTE — Progress Notes (Addendum)
Established Patient Office Visit   Subjective:  Patient ID: Isaiah Cordova, male    DOB: 1970-07-09  Age: 52 y.o. MRN: [id number]  Chief Complaint  Patient presents with   Annual Exam    CPE. Pt is fasting.     HPI Encounter Diagnoses  Name Primary?   Healthcare maintenance Yes   Depression with anxiety    Hematochezia    Gastroesophageal reflux disease, unspecified whether esophagitis present    Screening for prostate cancer    Screening for cholesterol level    Need for immunization against influenza    For a physical exam and follow-up of additional issues.  Continues to struggle with anxiety and depression.  Believes that the fluoxetine and quetiapine are helping and useful.  He is having to drive to [place] 3 days/week for his job.  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  Things are okay at home.  He lives with his wife and 93 year old daughter.  Has had little time for exercise.  Continues with regular dental care.  He has been experiencing intermittent mucousy hematochezia.  Ongoing issues with reflux that resolves when he takes his omeprazole.  He denies melena.  He knows that he is lactose intolerant.  He feels as though he is probably gluten sensitive.   Review of Systems  Constitutional: Negative.   HENT: Negative.    Eyes:  Negative for blurred vision, discharge and redness.  Respiratory: Negative.    Cardiovascular: Negative.   Gastrointestinal:  Positive for blood in stool and heartburn. Negative for abdominal pain, constipation and melena.  Genitourinary: Negative.   Musculoskeletal: Negative.  Negative for myalgias.  Skin:  Negative for rash.  Neurological:  Negative for tingling, loss of consciousness and weakness.  Endo/Heme/Allergies:  Negative for polydipsia.  Psychiatric/Behavioral:  Positive for depression. The patient is nervous/anxious.       05/09/2023    2:12 PM 03/18/2022    2:57 PM 09/24/2021    8:42 AM  Depression screen  PHQ 2/9  Decreased Interest 2 1 0  Down, Depressed, Hopeless 1 2 0  PHQ - 2 Score 3 3 0  Altered sleeping 2 3 2   Tired, decreased energy 3 2 1   Change in appetite 0 0 0  Feeling bad or failure about yourself  1 1 0  Trouble concentrating 0 2 0  Moving slowly or fidgety/restless 0 0 0  Suicidal thoughts 0 1 0  PHQ-9 Score 9 12 3   Difficult doing work/chores Somewhat difficult Somewhat difficult Not difficult at all       Current Outpatient Medications:    FLUoxetine (PROZAC) 40 MG capsule, Take 1 capsule (40 mg total) by mouth daily., Disp: 90 capsule, Rfl: 3   omeprazole (PRILOSEC) 40 MG capsule, Take 1 capsule (40 mg total) by mouth daily., Disp: 90 capsule, Rfl: 1   timolol (TIMOPTIC) 0.5 % ophthalmic solution, Place 0.5 drops into both eyes daily., Disp: , Rfl:    Omega-3 Fatty Acids (FISH OIL PO), Take 1 capsule by mouth every morning.  (Patient not taking: Reported on 03/18/2022), Disp: , Rfl:    pramoxine-hydrocortisone (PROCTOCREAM-HC) 1-1 % rectal cream, Place 1 Application rectally 2 (two) times daily. (Patient not taking: Reported on 05/09/2023), Disp: 30 g, Rfl: 0   predniSONE (STERAPRED UNI-PAK 21 TAB) 10 MG (21) TBPK tablet, Take 6 today, 5 tomorrow, 4 the next day and then 3, 2, 1 and stop (Patient not taking: Reported on 05/09/2023), Disp: 21  tablet, Rfl: 0   QUEtiapine (SEROQUEL) 25 MG tablet, Take 1 tablet (25 mg total) by mouth at bedtime. (Patient not taking: Reported on 05/09/2023), Disp: 30 tablet, Rfl: 2   Objective:     BP 120/84   Pulse 65   Temp 97.6 F (36.4 C)   Ht 6\' 2"  (1.88 m)   Wt 216 lb 12.8 oz (98.3 kg)   SpO2 99%   BMI 27.84 kg/m    Physical Exam Constitutional:      General: He is not in acute distress.    Appearance: Normal appearance. He is not ill-appearing, toxic-appearing or diaphoretic.  HENT:     Head: Normocephalic and atraumatic.     Right Ear: Tympanic membrane, ear canal and external ear normal.     Left Ear: Tympanic  membrane, ear canal and external ear normal.     Mouth/Throat:     Mouth: Mucous membranes are moist.     Pharynx: Oropharynx is clear. No oropharyngeal exudate or posterior oropharyngeal erythema.  Eyes:     General: No scleral icterus.       Right eye: No discharge.        Left eye: No discharge.     Extraocular Movements: Extraocular movements intact.     Conjunctiva/sclera: Conjunctivae normal.     Pupils: Pupils are equal, round, and reactive to light.  Cardiovascular:     Rate and Rhythm: Normal rate and regular rhythm.  Pulmonary:     Effort: Pulmonary effort is normal. No respiratory distress.     Breath sounds: Normal breath sounds.  Abdominal:     General: Bowel sounds are normal.     Tenderness: There is no guarding or rebound.  Genitourinary:    Prostate: Not enlarged, not tender and no nodules present.     Rectum: Guaiac result negative. External hemorrhoid present. No mass, tenderness or internal hemorrhoid. Normal anal tone.  Musculoskeletal:     Cervical back: No rigidity or tenderness.  Skin:    General: Skin is warm and dry.  Neurological:     Mental Status: He is alert and oriented to person, place, and time.  Psychiatric:        Mood and Affect: Mood normal.        Behavior: Behavior normal.      No results found for any visits on 05/09/23.    The 10-year ASCVD risk score (Arnett DK, et al., 2019) is: 5.1%    Assessment & Plan:   Healthcare maintenance -     Urinalysis, Routine w reflex microscopic  Depression with anxiety -     TSH  Hematochezia -     CBC with Differential/Platelet -     Ambulatory referral to Gastroenterology  Gastroesophageal reflux disease, unspecified whether esophagitis present -     Amylase -     Lipase -     Ambulatory referral to Gastroenterology  Screening for prostate cancer -     PSA  Screening for cholesterol level -     Comprehensive metabolic panel -     Lipid panel  Need for immunization against  influenza -     Flu vaccine trivalent PF, 6mos and older(Flulaval,Afluria,Fluarix,Fluzone)    Return in about 6 months (around 11/07/2023), or if symptoms worsen or fail to improve.  Information was given on health maintenance and disease prevention.  GI referral for hematochezia and ongoing reflux.  Mliss Sax, MD

## 2023-05-10 LAB — COMPREHENSIVE METABOLIC PANEL
ALT: 23 U/L (ref 0–53)
AST: 28 U/L (ref 0–37)
Albumin: 4.2 g/dL (ref 3.5–5.2)
Alkaline Phosphatase: 65 U/L (ref 39–117)
BUN: 12 mg/dL (ref 6–23)
CO2: 24 meq/L (ref 19–32)
Calcium: 8.6 mg/dL (ref 8.4–10.5)
Chloride: 107 meq/L (ref 96–112)
Creatinine, Ser: 0.86 mg/dL (ref 0.40–1.50)
GFR: 99.08 mL/min (ref >60.00)
Glucose, Bld: 85 mg/dL (ref 70–99)
Potassium: 4.1 meq/L (ref 3.5–5.1)
Sodium: 141 meq/L (ref 135–145)
Total Bilirubin: 1.8 mg/dL — ABNORMAL HIGH (ref 0.2–1.2)
Total Protein: 6.2 g/dL (ref 6.0–8.3)

## 2023-05-10 LAB — LIPID PANEL
Cholesterol: 194 mg/dL (ref 0–200)
HDL: 44.7 mg/dL (ref >39.00)
LDL Cholesterol: 128 mg/dL — ABNORMAL HIGH (ref 0–99)
NonHDL: 149.54
Total CHOL/HDL Ratio: 4
Triglycerides: 107 mg/dL (ref 0.0–149.0)
VLDL: 21.4 mg/dL (ref 0.0–40.0)

## 2023-05-10 LAB — PSA: PSA: 1.01 ng/mL (ref 0.10–4.00)

## 2023-05-10 LAB — TSH: TSH: 0.9 u[IU]/mL (ref 0.35–5.50)

## 2023-05-10 LAB — LIPASE: Lipase: 19 U/L (ref 11.0–59.0)

## 2023-05-10 LAB — AMYLASE: Amylase: 31 U/L (ref 27–131)

## 2023-05-16 ENCOUNTER — Encounter: Payer: Self-pay | Admitting: Pediatrics

## 2023-05-18 ENCOUNTER — Ambulatory Visit: Payer: BC Managed Care – PPO | Admitting: Family Medicine

## 2023-05-18 ENCOUNTER — Emergency Department (HOSPITAL_BASED_OUTPATIENT_CLINIC_OR_DEPARTMENT_OTHER): Payer: BC Managed Care – PPO

## 2023-05-18 ENCOUNTER — Other Ambulatory Visit (HOSPITAL_BASED_OUTPATIENT_CLINIC_OR_DEPARTMENT_OTHER): Payer: Self-pay

## 2023-05-18 ENCOUNTER — Encounter: Payer: Self-pay | Admitting: Family Medicine

## 2023-05-18 ENCOUNTER — Encounter (HOSPITAL_BASED_OUTPATIENT_CLINIC_OR_DEPARTMENT_OTHER): Payer: Self-pay | Admitting: *Deleted

## 2023-05-18 ENCOUNTER — Emergency Department (HOSPITAL_BASED_OUTPATIENT_CLINIC_OR_DEPARTMENT_OTHER)
Admission: EM | Admit: 2023-05-18 | Discharge: 2023-05-18 | Disposition: A | Discharge status: Home / Self Care | Payer: BC Managed Care – PPO | Attending: Emergency Medicine | Admitting: Emergency Medicine

## 2023-05-18 ENCOUNTER — Other Ambulatory Visit: Payer: Self-pay

## 2023-05-18 VITALS — BP 134/78 | HR 76 | Temp 97.6°F | Wt 211.2 lb

## 2023-05-18 DIAGNOSIS — Z23 Encounter for immunization: Secondary | ICD-10-CM | POA: Diagnosis not present

## 2023-05-18 DIAGNOSIS — R1032 Left lower quadrant pain: Secondary | ICD-10-CM | POA: Diagnosis not present

## 2023-05-18 DIAGNOSIS — K5792 Diverticulitis of intestine, part unspecified, without perforation or abscess without bleeding: Secondary | ICD-10-CM | POA: Insufficient documentation

## 2023-05-18 DIAGNOSIS — K5720 Diverticulitis of large intestine with perforation and abscess without bleeding: Secondary | ICD-10-CM | POA: Diagnosis not present

## 2023-05-18 DIAGNOSIS — K5730 Diverticulosis of large intestine without perforation or abscess without bleeding: Secondary | ICD-10-CM | POA: Diagnosis not present

## 2023-05-18 LAB — CBC WITH DIFFERENTIAL/PLATELET
Abs Immature Granulocytes: 0.04 10*3/uL (ref 0.00–0.07)
Basophils Absolute: 0.1 10*3/uL (ref 0.0–0.1)
Basophils Relative: 1 %
Eosinophils Absolute: 0.1 10*3/uL (ref 0.0–0.5)
Eosinophils Relative: 1 %
HCT: 45.2 % (ref 39.0–52.0)
Hemoglobin: 15 g/dL (ref 13.0–17.0)
Immature Granulocytes: 0 %
Lymphocytes Relative: 20 %
Lymphs Abs: 2.3 10*3/uL (ref 0.7–4.0)
MCH: 29.3 pg (ref 26.0–34.0)
MCHC: 33.2 g/dL (ref 30.0–36.0)
MCV: 88.3 fL (ref 80.0–100.0)
Monocytes Absolute: 1.1 10*3/uL — ABNORMAL HIGH (ref 0.1–1.0)
Monocytes Relative: 10 %
Neutro Abs: 7.8 10*3/uL — ABNORMAL HIGH (ref 1.7–7.7)
Neutrophils Relative %: 68 %
Platelets: 293 10*3/uL (ref 150–400)
RBC: 5.12 MIL/uL (ref 4.22–5.81)
RDW: 12.5 % (ref 11.5–15.5)
WBC: 11.5 10*3/uL — ABNORMAL HIGH (ref 4.0–10.5)
nRBC: 0 % (ref 0.0–0.2)

## 2023-05-18 LAB — URINALYSIS, ROUTINE W REFLEX MICROSCOPIC
Glucose, UA: NEGATIVE mg/dL
Ketones, ur: NEGATIVE mg/dL
Leukocytes,Ua: NEGATIVE
Nitrite: NEGATIVE
Protein, ur: 30 mg/dL — AB
Specific Gravity, Urine: 1.01 (ref 1.005–1.030)
pH: 6 (ref 5.0–8.0)

## 2023-05-18 LAB — COMPREHENSIVE METABOLIC PANEL
ALT: 18 U/L (ref 0–44)
AST: 18 U/L (ref 15–41)
Albumin: 4.3 g/dL (ref 3.5–5.0)
Alkaline Phosphatase: 65 U/L (ref 38–126)
Anion gap: 10 (ref 5–15)
BUN: 14 mg/dL (ref 6–20)
CO2: 26 mmol/L (ref 22–32)
Calcium: 9.6 mg/dL (ref 8.9–10.3)
Chloride: 103 mmol/L (ref 98–111)
Creatinine, Ser: 1.06 mg/dL (ref 0.61–1.24)
GFR, Estimated: >60 mL/min (ref >60)
Glucose, Bld: 103 mg/dL — ABNORMAL HIGH (ref 70–99)
Potassium: 4.2 mmol/L (ref 3.5–5.1)
Sodium: 139 mmol/L (ref 135–145)
Total Bilirubin: 3.7 mg/dL — ABNORMAL HIGH (ref <1.2)
Total Protein: 8 g/dL (ref 6.5–8.1)

## 2023-05-18 LAB — OCCULT BLOOD X 1 CARD TO LAB, STOOL: Fecal Occult Bld: NEGATIVE

## 2023-05-18 LAB — URINALYSIS, MICROSCOPIC (REFLEX)

## 2023-05-18 LAB — LIPASE, BLOOD: Lipase: 28 U/L (ref 11–51)

## 2023-05-18 MED ORDER — IOHEXOL 300 MG/ML  SOLN
100.0000 mL | Freq: Once | INTRAMUSCULAR | Status: AC | PRN
  Administered 2023-05-18: 100 mL via INTRAVENOUS

## 2023-05-18 MED ORDER — HYDROCODONE-ACETAMINOPHEN 5-325 MG PO TABS
1.0000 | ORAL_TABLET | Freq: Once | ORAL | Status: AC
  Administered 2023-05-18: 1 via ORAL
  Filled 2023-05-18: qty 1

## 2023-05-18 MED ORDER — AMOXICILLIN-POT CLAVULANATE 875-125 MG PO TABS: 1.0000 | ORAL_TABLET | Freq: Two times a day (BID) | ORAL | 0 refills | Status: DC

## 2023-05-18 MED ORDER — ONDANSETRON HCL 4 MG/2ML IJ SOLN
4.0000 mg | Freq: Once | INTRAMUSCULAR | Status: AC
  Administered 2023-05-18: 4 mg via INTRAVENOUS
  Filled 2023-05-18: qty 2

## 2023-05-18 MED ORDER — AMOXICILLIN-POT CLAVULANATE 875-125 MG PO TABS
1.0000 | ORAL_TABLET | Freq: Two times a day (BID) | ORAL | 0 refills | Status: DC
  Filled 2023-05-18: qty 14, 7d supply, fill #0

## 2023-05-18 MED ORDER — AMOXICILLIN-POT CLAVULANATE 875-125 MG PO TABS
1.0000 | ORAL_TABLET | Freq: Once | ORAL | Status: AC
  Administered 2023-05-18: 1 via ORAL
  Filled 2023-05-18: qty 1

## 2023-05-18 MED ORDER — KETOROLAC TROMETHAMINE 60 MG/2ML IM SOLN
60.0000 mg | Freq: Once | INTRAMUSCULAR | Status: AC
  Administered 2023-05-18: 60 mg via INTRAMUSCULAR

## 2023-05-18 NOTE — ED Provider Notes (Addendum)
[facility] Provider Note   CSN: [id number] Arrival date & time: 05/18/23  0935     History  Chief Complaint  Patient presents with   Abdominal Pain    Isaiah Cordova is a 52 y.o. male.  52 year old male with a history of GERD, hyperlipidemia, and rectal bleeding with a negative colonoscopy who presents to the emergency department with abdominal pain, diarrhea, and bloody stools.  For several months has had abdominal pain and diarrhea.  Has been having occasional blood in his stools in that time.  Since Monday has been having worsening abdominal pain that is mostly in his left lower quadrant.  Stools have been bloody and mucousy on Monday and Tuesday.  Yesterday and today reports no blood.  Not on blood thinners.  Nausea but no vomiting.  Went to an outpatient appointment today who referred him to the emergency department due to concerns for possible diverticulitis.  Was given Toradol at their facility.       Home Medications Prior to Admission medications   Medication Sig Start Date End Date Taking? Authorizing Provider  amoxicillin-clavulanate (AUGMENTIN) 875-125 MG tablet Take 1 tablet by mouth every 12 (twelve) hours. 05/18/23   Rondel Baton, MD  FLUoxetine (PROZAC) 40 MG capsule Take 1 capsule (40 mg total) by mouth daily. 03/18/22   Mliss Sax, MD  Omega-3 Fatty Acids (FISH OIL PO) Take 1 capsule by mouth every morning.  Patient not taking: Reported on 03/18/2022    [provider]  omeprazole (PRILOSEC) 40 MG capsule Take 1 capsule (40 mg total) by mouth daily. Patient not taking: Reported on 05/18/2023 09/24/21   Mliss Sax, MD  pramoxine-hydrocortisone (PROCTOCREAM-HC) 1-1 % rectal cream Place 1 Application rectally 2 (two) times daily. Patient not taking: Reported on 05/18/2023 03/18/22   Mliss Sax, MD  predniSONE (STERAPRED UNI-PAK 21 TAB) 10 MG (21) TBPK tablet Take 6  today, 5 tomorrow, 4 the next day and then 3, 2, 1 and stop Patient not taking: Reported on 05/18/2023 03/18/22   Mliss Sax, MD  QUEtiapine (SEROQUEL) 25 MG tablet Take 1 tablet (25 mg total) by mouth at bedtime. Patient not taking: Reported on 05/18/2023 03/18/22   Mliss Sax, MD  timolol (TIMOPTIC) 0.5 % ophthalmic solution Place 0.5 drops into both eyes daily.    [provider]      Allergies    Dust mite extract, Lisdexamfetamine, Other, and Pollen extract    Review of Systems   Review of Systems  Physical Exam Updated Vital Signs BP 113/77   Pulse 66   Temp 98.2 F (36.8 C) (Oral)   Resp 16   SpO2 96%  Physical Exam Vitals and nursing note reviewed.  Constitutional:      General: He is not in acute distress.    Appearance: He is well-developed.  HENT:     Head: Normocephalic and atraumatic.     Right Ear: External ear normal.     Left Ear: External ear normal.     Nose: Nose normal.  Eyes:     Extraocular Movements: Extraocular movements intact.     Conjunctiva/sclera: Conjunctivae normal.     Pupils: Pupils are equal, round, and reactive to light.  Pulmonary:     Effort: Pulmonary effort is normal. No respiratory distress.  Abdominal:     General: There is no distension.     Palpations: Abdomen is soft. There is no mass.  Tenderness: There is abdominal tenderness (LLQ). There is no guarding.  Genitourinary:    Comments: Chaperoned by patient's Alex.  Internal and external hemorrhoids noted. Normal rectal tone. Brown stool in rectal vault. No melena or gross blood.  Musculoskeletal:     Cervical back: Normal range of motion and neck supple.  Skin:    General: Skin is warm and dry.  Neurological:     Mental Status: He is alert. Mental status is at baseline.  Psychiatric:        Mood and Affect: Mood normal.        Behavior: Behavior normal.     ED Results / Procedures / Treatments   Labs (all labs ordered are listed,  but only abnormal results are displayed) Labs Reviewed  COMPREHENSIVE METABOLIC PANEL - Abnormal; Notable for the following components:      Result Value   Glucose, Bld 103 (*)    Total Bilirubin 3.7 (*)    All other components within normal limits  CBC WITH DIFFERENTIAL/PLATELET - Abnormal; Notable for the following components:   WBC 11.5 (*)    Neutro Abs 7.8 (*)    Monocytes Absolute 1.1 (*)    All other components within normal limits  URINALYSIS, ROUTINE W REFLEX MICROSCOPIC - Abnormal; Notable for the following components:   Hgb urine dipstick TRACE (*)    Bilirubin Urine SMALL (*)    Protein, ur 30 (*)    All other components within normal limits  URINALYSIS, MICROSCOPIC (REFLEX) - Abnormal; Notable for the following components:   Bacteria, UA RARE (*)    All other components within normal limits  LIPASE, BLOOD  OCCULT BLOOD X 1 CARD TO LAB, STOOL  POC OCCULT BLOOD, ED    EKG None  Radiology CT ABDOMEN PELVIS W CONTRAST Result Date: 05/18/2023 CLINICAL DATA:  Left lower quadrant abdominal pain EXAM: CT ABDOMEN AND PELVIS WITH CONTRAST TECHNIQUE: Multidetector CT imaging of the abdomen and pelvis was performed using the standard protocol following bolus administration of intravenous contrast. RADIATION DOSE REDUCTION: This exam was performed according to the departmental dose-optimization program which includes automated exposure control, adjustment of the mA and/or kV according to patient size and/or use of iterative reconstruction technique. CONTRAST:  OMNIPAQUE IOHEXOL 300 MG/ML  SOLN COMPARISON:  None Available. FINDINGS: Lower chest: No acute abnormality. Bibasilar scarring or atelectasis. Hepatobiliary: No solid liver abnormality is seen. No gallstones, gallbladder wall thickening, or biliary dilatation. Pancreas: Unremarkable. No pancreatic ductal dilatation or surrounding inflammatory changes. Spleen: Normal in size without significant abnormality. Adrenals/Urinary  Tract: Adrenal glands are unremarkable. Kidneys are normal, without renal calculi, solid lesion, or hydronephrosis. Bladder is unremarkable. Stomach/Bowel: Stomach is within normal limits. Appendix appears normal. Descending and sigmoid diverticulosis. Focal fat stranding about a prominent diverticulum of the posterior proximal sigmoid colon (series 301, image 71). Vascular/Lymphatic: Scattered aortic atherosclerosis. No enlarged abdominal or pelvic lymph nodes. Reproductive: Mild prostatomegaly. Other: No abdominal wall hernia or abnormality. No ascites. Musculoskeletal: No acute or significant osseous findings. IMPRESSION: Descending and sigmoid diverticulosis. Focal fat stranding about a prominent diverticulum of the posterior proximal sigmoid colon, consistent with acute uncomplicated diverticulitis. Aortic Atherosclerosis (ICD10-I70.0). Electronically Signed   By: Jearld Lesch M.D.   On: 05/18/2023 14:49    Procedures Procedures    Medications Ordered in ED Medications  ondansetron (ZOFRAN) injection 4 mg (4 mg Intravenous Given 05/18/23 1016)  HYDROcodone-acetaminophen (NORCO/VICODIN) 5-325 MG per tablet 1 tablet (1 tablet Oral Given 05/18/23 1016)  iohexol (OMNIPAQUE)  300 MG/ML solution 100 mL (100 mLs Intravenous Contrast Given 05/18/23 1114)  amoxicillin-clavulanate (AUGMENTIN) 875-125 MG per tablet 1 tablet (1 tablet Oral Given 05/18/23 1129)    ED Course/ Medical Decision Making/ A&P                                 Medical Decision Making Amount and/or Complexity of Data Reviewed Labs: ordered. Radiology: ordered.  Risk Prescription drug management.   Isaiah Cordova is a 52 y.o. male with comorbidities that complicate the patient evaluation including GERD, hyperlipidemia, and rectal bleeding with a negative colonoscopy who presents to the emergency department with abdominal pain, diarrhea, and bloody stools.    Initial Ddx:  Diverticulitis, colitis, UTI, kidney stone,  pyelonephritis, GI bleed  MDM:  Based on the patient's symptoms feel that he likely have diverticulitis.  Will obtain CT scan which will show if they have colitis and/for a kidney stone which could also be causing similar symptoms.  With the lower abdominal pain we will send off urinalysis to test for UTI.  Does also report some bloody stools.  On exam does have hemorrhoids and stool brown without gross blood.  Will send Hemoccult.  Plan:  Labs Urinalysis CT abdomen pelvis IV contrast Pain and nausea medication Hemoccult  ED Summary/Re-evaluation:  Patient was found to have uncomplicated diverticulitis.  Hemoccult was negative so low concern for GI bleed.  Feel that his blood may have been coming from his hemorrhoids.  Will treat them with antibiotics at this time and to start a clear liquid diet and to advance as tolerated.  Instructed to follow-up with her primary doctor in several days and GI to see if colonoscopy or any other interventions are required.  This patient presents to the ED for concern of complaints listed in HPI, this involves an extensive number of treatment options, and is a complaint that carries with it a high risk of complications and morbidity. Disposition including potential need for admission considered.   Dispo: DC Home. Return precautions discussed including, but not limited to, those listed in the AVS. Allowed pt time to ask questions which were answered fully prior to dc.    Records reviewed Outpatient Clinic Notes The following labs were independently interpreted: Chemistry and show no acute abnormality I independently reviewed the following imaging with scope of interpretation limited to determining acute life threatening conditions related to emergency care: CT Abdomen/Pelvis and agree with the radiologist interpretation with the following exceptions: none I personally reviewed and interpreted cardiac monitoring: normal sinus rhythm  I personally reviewed and  interpreted the pt's EKG: see above for interpretation  I have reviewed the patients home medications and made adjustments as needed     Final Clinical Impression(s) / ED Diagnoses Final diagnoses:  Diverticulitis    Rx / DC Orders ED Discharge Orders          Ordered    amoxicillin-clavulanate (AUGMENTIN) 875-125 MG tablet  Every 12 hours,   Status:  Discontinued        05/18/23 1453    amoxicillin-clavulanate (AUGMENTIN) 875-125 MG tablet  Every 12 hours        05/18/23 1457              Rondel Baton, MD 05/18/23 1942    Rondel Baton, MD 05/18/23 1944

## 2023-05-18 NOTE — ED Notes (Signed)
Pt states he is unable to get urine sample. EDP notified

## 2023-05-18 NOTE — Discharge Instructions (Addendum)
You were seen for diverticulitis in the emergency department.   At home, please take the antibiotics we have prescribed you. Start with a clear liquid diet (coffee, tea, sports drinks, broths, etc.) and advance as tolerated to solid foods over the next few days.    Check your MyChart online for the results of any tests that had not resulted by the time you left the emergency department.   Follow-up with your primary doctor in 2-3 days regarding your visit.  Follow-up with the gastroenterologist to discuss if you need a colonoscopy in 6 weeks. If you have had more than 3 occurrences you may need to also talk to the general surgeons to see if you need an operation.   Return immediately to the emergency department if you experience any of the following: severe abdominal pain, high fevers, or any other concerning symptoms.    Thank you for visiting our Emergency Department. It was a pleasure taking care of you today.   

## 2023-05-18 NOTE — Progress Notes (Signed)
Established Patient Office Visit   Subjective:  Patient ID: Isaiah Cordova, male    DOB: 10-05-1970  Age: 52 y.o. MRN: [id number]  Chief Complaint  Patient presents with   Abdominal Pain    On going lower abdominal pain and blood in stool since 05/09/2023 visit. Hasn't eaten in two days due to pain. Wants tdap    Abdominal Pain Associated symptoms include constipation. Pertinent negatives include no myalgias, nausea, vomiting or weight loss.   Encounter Diagnoses  Name Primary?   Diverticulitis of colon with perforation Yes   Immunization due    Returns with increasing generalized abdominal pain that seems to be worse in the left lower quadrant and hematochezia.  Denies fevers but there have been cold sweats.  Diverticula of the left colon were noted on his last colonoscopy in 2021.   Review of Systems  Constitutional: Negative.  Negative for chills, diaphoresis, malaise/fatigue and weight loss.  HENT: Negative.    Eyes: Negative.  Negative for blurred vision, double vision, discharge and redness.  Respiratory: Negative.    Cardiovascular: Negative.  Negative for chest pain.  Gastrointestinal:  Positive for abdominal pain, blood in stool and constipation. Negative for nausea and vomiting.  Genitourinary: Negative.   Musculoskeletal: Negative.  Negative for falls and myalgias.  Skin:  Negative for rash.  Neurological:  Negative for tingling, speech change, loss of consciousness and weakness.  Endo/Heme/Allergies:  Negative for polydipsia.  Psychiatric/Behavioral: Negative.       Current Outpatient Medications:    FLUoxetine (PROZAC) 40 MG capsule, Take 1 capsule (40 mg total) by mouth daily., Disp: 90 capsule, Rfl: 3   timolol (TIMOPTIC) 0.5 % ophthalmic solution, Place 0.5 drops into both eyes daily., Disp: , Rfl:    Omega-3 Fatty Acids (FISH OIL PO), Take 1 capsule by mouth every morning.  (Patient not taking: Reported on 03/18/2022), Disp: , Rfl:    omeprazole  (PRILOSEC) 40 MG capsule, Take 1 capsule (40 mg total) by mouth daily. (Patient not taking: Reported on 05/18/2023), Disp: 90 capsule, Rfl: 1   pramoxine-hydrocortisone (PROCTOCREAM-HC) 1-1 % rectal cream, Place 1 Application rectally 2 (two) times daily. (Patient not taking: Reported on 05/18/2023), Disp: 30 g, Rfl: 0   predniSONE (STERAPRED UNI-PAK 21 TAB) 10 MG (21) TBPK tablet, Take 6 today, 5 tomorrow, 4 the next day and then 3, 2, 1 and stop (Patient not taking: Reported on 05/18/2023), Disp: 21 tablet, Rfl: 0   QUEtiapine (SEROQUEL) 25 MG tablet, Take 1 tablet (25 mg total) by mouth at bedtime. (Patient not taking: Reported on 05/18/2023), Disp: 30 tablet, Rfl: 2  Current Facility-Administered Medications:    ketorolac (TORADOL) injection 60 mg, 60 mg, Intramuscular, Once,    Objective:     BP 134/78 (BP Location: Right Arm, Patient Position: Sitting, Cuff Size: Normal)   Pulse 76   Temp 97.6 F (36.4 C) (Temporal)   Wt 211 lb 3.2 oz (95.8 kg)   SpO2 98%   BMI 27.12 kg/m    Physical Exam Constitutional:      General: He is not in acute distress.    Appearance: Normal appearance. He is not ill-appearing, toxic-appearing or diaphoretic.  HENT:     Head: Normocephalic and atraumatic.     Right Ear: External ear normal.     Left Ear: External ear normal.     Mouth/Throat:     Pharynx: No posterior oropharyngeal erythema.  Eyes:     General: No scleral icterus.  Right eye: No discharge.        Left eye: No discharge.     Conjunctiva/sclera: Conjunctivae normal.     Pupils: Pupils are equal, round, and reactive to light.  Cardiovascular:     Rate and Rhythm: Normal rate and regular rhythm.  Pulmonary:     Effort: Pulmonary effort is normal. No respiratory distress.     Breath sounds: Normal breath sounds.  Abdominal:     General: Bowel sounds are normal.     Tenderness: There is generalized abdominal tenderness. There is guarding and rebound.     Comments: There is  generalized abdominal pain with rebound and guarding throughout.  Musculoskeletal:     Cervical back: No rigidity or tenderness.  Skin:    General: Skin is warm and dry.  Neurological:     Mental Status: He is alert and oriented to person, place, and time.  Psychiatric:        Mood and Affect: Mood normal.        Behavior: Behavior normal.      No results found for any visits on 05/18/23.    The 10-year ASCVD risk score (Arnett DK, et al., 2019) is: 5.4%    Assessment & Plan:   Diverticulitis of colon with perforation -     Ketorolac Tromethamine  Immunization due -     Tdap vaccine greater than or equal to 7yo IM    No follow-ups on file.  Patient advised to go to the emergency room for urgent evaluation reticulitis with possible perforation.  Mliss Sax, MD

## 2023-05-18 NOTE — ED Triage Notes (Signed)
Pt is here for abdominal pain which has been going on "for a few months" but has been severe since Monday.  Pt also reports blood in stool since Monday and pt reports that it is "mucousy and bright red".  Pt has only been taking water since Monday.

## 2023-05-29 ENCOUNTER — Encounter: Payer: Self-pay | Admitting: Family Medicine

## 2023-05-29 ENCOUNTER — Ambulatory Visit: Payer: BC Managed Care – PPO | Admitting: Family Medicine

## 2023-05-29 VITALS — BP 118/80 | HR 70 | Temp 98.1°F | Ht 74.0 in | Wt 215.6 lb

## 2023-05-29 DIAGNOSIS — K582 Mixed irritable bowel syndrome: Secondary | ICD-10-CM | POA: Diagnosis not present

## 2023-05-29 DIAGNOSIS — Z8719 Personal history of other diseases of the digestive system: Secondary | ICD-10-CM | POA: Diagnosis not present

## 2023-05-29 DIAGNOSIS — F418 Other specified anxiety disorders: Secondary | ICD-10-CM | POA: Diagnosis not present

## 2023-05-29 DIAGNOSIS — Z566 Other physical and mental strain related to work: Secondary | ICD-10-CM

## 2023-05-29 DIAGNOSIS — E739 Lactose intolerance, unspecified: Secondary | ICD-10-CM | POA: Insufficient documentation

## 2023-05-29 MED ORDER — FLUOXETINE HCL 40 MG PO CAPS: 40.0000 mg | ORAL_CAPSULE | Freq: Every day | ORAL | 3 refills | Status: DC

## 2023-05-29 NOTE — Progress Notes (Signed)
Established Patient Office Visit   Subjective:  Patient ID: Isaiah Cordova, male    DOB: 03-28-1971  Age: 52 y.o. MRN: [id number]  Chief Complaint  Patient presents with   Hospitalization Follow-up    Follow from diverticulitis. Pt states improvement no concerns. Pt requesting Rx or Fluoxetine.     HPI Encounter Diagnoses  Name Primary?   History of diverticulitis Yes   Depression with anxiety    Irritable bowel syndrome with both constipation and diarrhea    Lactose intolerance    Stress at work    For hospital discharge follow-up status posttreatment for diverticulitis with Augmentin.  Feeling better.  Decreasing abdominal pain.  No fevers chills nausea or vomiting.  He does seem to be sensitive to dairy products.  He has developed this over the years.  Under a great deal of stress at work associated with ongoing commutes to [place] for work.  He takes omeprazole for GERD perhaps once a month.  He does recognize that certain foods pizza tend to set it off.  Follow-up as scheduled with GI in February.   Review of Systems  Constitutional: Negative.   HENT: Negative.    Eyes:  Negative for blurred vision, discharge and redness.  Respiratory: Negative.    Cardiovascular: Negative.   Gastrointestinal:  Negative for abdominal pain, nausea and vomiting.  Genitourinary: Negative.   Musculoskeletal: Negative.  Negative for myalgias.  Skin:  Negative for rash.  Neurological:  Negative for tingling, loss of consciousness and weakness.  Endo/Heme/Allergies:  Negative for polydipsia.      05/09/2023    2:12 PM 03/18/2022    2:57 PM 09/24/2021    8:42 AM  Depression screen PHQ 2/9  Decreased Interest 2 1 0  Down, Depressed, Hopeless 1 2 0  PHQ - 2 Score 3 3 0  Altered sleeping 2 3 2   Tired, decreased energy 3 2 1   Change in appetite 0 0 0  Feeling bad or failure about yourself  1 1 0  Trouble concentrating 0 2 0  Moving slowly or fidgety/restless 0 0 0  Suicidal thoughts  0 1 0  PHQ-9 Score 9 12 3   Difficult doing work/chores Somewhat difficult Somewhat difficult Not difficult at all       Current Outpatient Medications:    amoxicillin-clavulanate (AUGMENTIN) 875-125 MG tablet, Take 1 tablet by mouth every 12 (twelve) hours., Disp: 14 tablet, Rfl: 0   FLUoxetine (PROZAC) 40 MG capsule, Take 1 capsule (40 mg total) by mouth daily., Disp: 90 capsule, Rfl: 3   timolol (TIMOPTIC) 0.5 % ophthalmic solution, Place 0.5 drops into both eyes daily., Disp: , Rfl:    Objective:     BP 118/80   Pulse 70   Temp 98.1 F (36.7 C)   Ht 6\' 2"  (1.88 m)   Wt 215 lb 9.6 oz (97.8 kg)   SpO2 96%   BMI 27.68 kg/m    Physical Exam Constitutional:      General: He is not in acute distress.    Appearance: Normal appearance. He is not ill-appearing, toxic-appearing or diaphoretic.  HENT:     Head: Normocephalic and atraumatic.     Right Ear: External ear normal.     Left Ear: External ear normal.  Eyes:     General: No scleral icterus.       Right eye: No discharge.        Left eye: No discharge.     Extraocular Movements: Extraocular movements intact.  Conjunctiva/sclera: Conjunctivae normal.  Pulmonary:     Effort: Pulmonary effort is normal. No respiratory distress.  Skin:    General: Skin is warm and dry.  Neurological:     Mental Status: He is alert and oriented to person, place, and time.  Psychiatric:        Mood and Affect: Mood normal.        Behavior: Behavior normal.      No results found for any visits on 05/29/23.    The 10-year ASCVD risk score (Arnett DK, et al., 2019) is: 4.3%    Assessment & Plan:   History of diverticulitis  Depression with anxiety -     FLUoxetine HCl; Take 1 capsule (40 mg total) by mouth daily.  Dispense: 90 capsule; Refill: 3  Irritable bowel syndrome with both constipation and diarrhea  Lactose intolerance  Stress at work    No follow-ups on file.  Believe that his GI symptoms are  multifactorial including lactose intolerance, IBS and diverticulosis.  Information was given on diverticulitis, FODMAP diet and lactose intolerance.  Discussed augmenting Prozac  Mliss Sax, MD

## 2023-07-27 NOTE — Progress Notes (Signed)
 [facility] Initial Consultation   Referring Provider Mliss Sax, MD [address] [place],  [place] [address]  Primary Care Provider Mliss Sax, MD  Patient Profile: Isaiah Cordova is a 53 y.o. male who is seen in consultation in the [facility] at the request of Dr. Doreene Burke for evaluation and management of the problem(s) noted below.  Problem List: IBS-D Hemorrhoids, rectal bleeding Lactose intolerance GERD Uncomplicated diverticulitis on CT imaging 04/2023 Gilbert's syndrome   History of Present Illness   Isaiah Cordova is a 53 y.o. male with a history of detached retina status postsurgery x 3, glaucoma, hernia status postsurgery, anxiety and depression who is referred for evaluation and management of IBS-D, hemorrhoids/rectal bleeding, GERD and recent history of uncomplicated diverticulitis.  Uncomplicated diverticulitis -- History of intermittent abdominal pain on and off over the years -- Developed worsening abdominal pain 04/2023 prompting ER evaluation -- CTAP 04/2023 showed uncomplicated left-sided diverticulitis without abscess --> tx'd with Augmentin -- No recurrence of abdominal pain since completing antibiotics -- Last colonoscopy 08/2019 -EH,  IH, left colon diverticula --> reviewed guidelines for updated colonoscopy in the setting of recent episode of diverticulitis  IBS-D, lactose intolerance -- Longstanding history of diarrhea and abdominal pain diagnosed as IBS-D; he suspects he also has a component of lactose intolerance -- Symptoms exacerbated by the ingestion of dairy, spaghetti sauce, greasy food and anxiety -- States he has not had formal GI workup for his diarrhea -biopsies from colonoscopy in 2011 do not show microscopic colitis -- No family history of gastrointestinal disorders other than his mother having diverticulitis requiring surgery -- Recalls having a tick bite in the past and has not been  evaluated for alpha gal allergy  GERD -- Endorses intermittent heartburn for which he takes omeprazole as needed with benefit -- Currently denies pyrosis, regurgitation, dysphagia or odynophagia -- Last EGD performed 10/2015 and was normal -- No family history of Barrett's esophagus or esophageal cancer  Hemorrhoids, rectal bleeding -- Has seen blood in his stool on and off over the years -- No findings on colonoscopy other than internal and external hemorrhoids -- Intermittent anorectal discomfort -- No previous hemorrhoid surgeries  Last colonoscopy: 08/2019 -EH, IH, left colon diverticula Last endoscopy: 10/2015 -normal  Last Abd CT/CTE/MRE: 04/2023 CTAP -uncomplicated diverticulitis  GI Review of Symptoms Significant for loose stool and intermittent heartburn. Otherwise negative.  General Review of Systems  Review of systems is significant for the pertinent positives and negatives as listed per the HPI.  Full ROS is otherwise negative.  Past Medical History   Past Medical History:  Diagnosis Date   Androgen deficiency 11/07/2017   Anxiety    Atypical chest pain 02/12/2020   Benign prostatic hyperplasia with weak urinary stream 02/12/2020   Bronchitis 05/21/2018   Cataract    bilateral removed   Concussion    Depression    Depression with anxiety 02/07/2019   Detached retina    2 right eye, 1 in left eye   DOE (dyspnea on exertion) 02/12/2020   Dyspnea on exertion 02/12/2020   Elevated LDL cholesterol level 02/07/2019   Fatigue 08/26/2013   Food allergy 11/07/2017   Gastric pain 10/16/2015   Gastric ulcer    Gastroesophageal reflux disease 10/16/2015   GERD (gastroesophageal reflux disease)    Gilbert's syndrome 01/24/2014   Headache(784.0)    chronic   Healthcare maintenance 06/16/2014   Hemorrhoids, external    History of retinal detachment 05/21/2018   Hypercholesteremia  Irritable bowel    Irritable bowel syndrome with diarrhea 11/07/2017   Joint stiffness of both  ankles 07/27/2018   Joint stiffness of both knees 07/27/2018   Left ankle injury     torn ligements x1   Midepigastric pain 10/27/2015   Need for influenza vaccination 02/12/2020   Neuromuscular disorder (HCC)    RLS   Rectal bleeding    history of, negative colonoscopy 2005-2006   Right ankle injury    torn ligemnts x3   RLS (restless legs syndrome) 11/16/2012     Past Surgical History   Past Surgical History:  Procedure Laterality Date   CATARACT EXTRACTION, BILATERAL     COLONOSCOPY     fracture collar bone     GAS INSERTION Right 04/22/2019   Procedure: Insertion Of Gas;  Surgeon: Stephannie Li, MD;  Location: [facility] OR;  Service: Ophthalmology;  Laterality: Right;   GAS/FLUID EXCHANGE Right 04/22/2019   Procedure: Gas/Fluid Exchange;  Surgeon: Stephannie Li, MD;  Location: [facility] OR;  Service: Ophthalmology;  Laterality: Right;   HERNIA REPAIR     multiplr fractures around left eye     NASAL SEPTUM SURGERY     PARS PLANA VITRECTOMY Right 04/22/2019   Procedure: PARS PLANA VITRECTOMY WITH 25 GAUGE;  Surgeon: Stephannie Li, MD;  Location: [facility] OR;  Service: Ophthalmology;  Laterality: Right;   PHOTOCOAGULATION WITH LASER Right 04/22/2019   Procedure: Photocoagulation With Laser;  Surgeon: Stephannie Li, MD;  Location: [facility] OR;  Service: Ophthalmology;  Laterality: Right;   POLYPECTOMY     RETINAL DETACHMENT SURGERY     right fractured hand     UPPER GASTROINTESTINAL ENDOSCOPY       Allergies and Medications   Allergies  Allergen Reactions   Dust Mite Extract Other (See Comments)    sneezing   Lisdexamfetamine Other (See Comments)    REACTION: chest pains   Other Other (See Comments)    GRASS causes sneezing   Pollen Extract Other (See Comments)    sneezing    Current Meds  Medication Sig   FLUoxetine (PROZAC) 40 MG capsule Take 1 capsule (40 mg total) by mouth daily.     Family History   Family History  Problem Relation Age of Onset   Hypertension Father     Diabetes Father    Hyperlipidemia Father    Depression Maternal Grandfather        prostate   Prostate cancer Maternal Grandfather    Depression Paternal Grandfather        committed suicide   Hypothyroidism Other    Diabetes Mellitus II Brother    Colon cancer Neg Hx    Esophageal cancer Neg Hx    Rectal cancer Neg Hx    Stomach cancer Neg Hx      Social History   Social History   Tobacco Use   Smoking status: Never    Passive exposure: Never   Smokeless tobacco: Never  Vaping Use   Vaping status: Never Used  Substance Use Topics   Alcohol use: Yes    Alcohol/week: 2.0 standard drinks of alcohol    Types: 2 Cans of beer per week    Comment: per month   Drug use: Never   Isaiah Cordova reports that he has never smoked. He has never been exposed to tobacco smoke. He has never used smokeless tobacco. He reports current alcohol use of about 2.0 standard drinks of alcohol per week. He reports that he does not use drugs.  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 Weight: 213 lb (96.6 kg)  General: Well developed, well nourished, no acute distress Head: Normocephalic and atraumatic Eyes: Sclerae anicteric, EOMI Lungs: Clear throughout to auscultation Heart: Regular rate and rhythm; No murmurs, rubs or bruits Abdomen: Soft, non tender and non distended. No masses, hepatosplenomegaly or hernias noted. Normal Bowel sounds Rectal: Deferred Musculoskeletal: Symmetrical with no gross deformities    Review of Data  The following data was reviewed at the time of this encounter:  Laboratory Studies      Latest Ref Rng & Units 07/28/2023    3:47 PM 05/18/2023    9:50 AM 05/09/2023     2:26 PM  CBC  WBC 4.0 - 10.5 K/uL 7.1  11.5  6.0   Hemoglobin 13.0 - 17.0 g/dL 16.1  09.6  04.5   Hematocrit 39.0 - 52.0 % 46.8  45.2  42.4   Platelets 150.0 - 400.0 K/uL 279.0  293  249.0     Lab Results  Component Value Date   LIPASE 28 05/18/2023      Latest Ref Rng & Units 07/28/2023    3:47 PM 05/18/2023    9:50 AM 05/09/2023    2:26 PM  CMP  Glucose 70 - 99 mg/dL 86  409  85   BUN 6 - 23 mg/dL 16  14  12    Creatinine 0.40 - 1.50 mg/dL 8.11  9.14  7.82   Sodium 135 - 145 mEq/L 139  139  141   Potassium 3.5 - 5.1 mEq/L 4.9  4.2  4.1   Chloride 96 - 112 mEq/L 104  103  107   CO2 19 - 32 mEq/L 28  26  24    Calcium 8.4 - 10.5 mg/dL 9.5  9.6  8.6   Total Protein 6.0 - 8.3 g/dL 7.2  8.0  6.2   Total Bilirubin 0.2 - 1.2 mg/dL 1.8  3.7  1.8   Alkaline Phos 39 - 117 U/L 73  65  65   AST 0 - 37 U/L 18  18  28    ALT 0 - 53 U/L 18  18  23      Imaging Studies  CTAP 04/2023 Descending and sigmoid diverticulosis. Focal fat stranding about a prominent diverticulum of the posterior proximal sigmoid colon, consistent with acute uncomplicated diverticulitis.   Aortic Atherosclerosis (ICD10-I70.0).  RUQ ultrasound 04/2021 There is inhomogeneous increased echogenicity liver suggesting fatty infiltration. There are small foci of sparing from fatty infiltration in the liver measuring up to 3.5 cm in maximum diameter. No other significant sonographic abnormality is seen in the right upper quadrant.  GI Procedures and Studies  Colonoscopy 08/2019 EH, IH, left colon diverticula  Upper endoscopy 10/2015 Normal  Colonoscopy 10/2009 Normal, mild diverticulosis in the left colon Path: No evidence of microscopic colitis  Colonoscopy 10/2004 Normal, EH   Clinical Impression  It is my clinical impression that Isaiah Cordova is a 53 y.o. male with;  IBS-D Lactose intolerance Hemorrhoids, rectal bleeding GERD Uncomplicated diverticulitis on CT imaging 04/2023 Gilbert's  syndrome  Isaiah Cordova presents to the office today to follow-up a recent episode of uncomplicated diverticulitis diagnosed in 04/2023.  He reports developing acute on chronic abdominal pain.  CT imaging confirmed evidence of diverticulitis which was successfully treated with Augmentin.  He has not had recurrence of pain since that time.  Last colonoscopy was performed in April 2021.  At today's visit we reviewed guidelines that recommend an updated colonoscopy in the setting of acute diverticulitis if it has been more than 1 to 2 years since completion of a high-quality study.  He is amenable to proceeding.  This will also provide an opportunity for workup of diarrhea, and reevaluation of his history of rectal bleeding/hemorrhoids.  He has also experienced chronic diarrhea for many years diagnosed as IBS-D and lactose intolerance.  Colonoscopy in 2011 did not show any evidence of microscopic colitis on biopsy.  At today's visit we discussed updating laboratory studies to evaluate for celiac disease, SIBO, thyroid disease and alpha gal allergy.  Other considerations could include forms of carbohydrate intolerance such as fructose or sucrose, bile acid diarrhea.  At the time of his future colonoscopy we will perform additional biopsies to rule out microscopic colitis again.  Isaiah Cordova also has a history of GERD and heartburn that he manages with as needed omeprazole.  Currently denies dysphagia or odynophagia.  Last EGD was performed in 2017.  Given longstanding GERD and age we discussed updating his EGD.  At the same time we can also perform additional biopsies for causes of diarrhea.  Plan  Labs today: CBC, CMP, ESR, CRP, folate, celiac panel, vitamin B12, thyroid profile, alpha gal panel Schedule EGD and colonoscopy at [facility] May continue to use omeprazole as needed for management of GERD.  Reviewed if symptoms are more chronic than he should take his medication on a regular basis. If workup does corroborate  a diagnosis of IBS-D can target diarrhea treatment towards use of antidiarrheals, anticholinergics and possibly Viberzi. Monitor weight and anthropometrics  Planned Follow Up Return in about 3 months (around 10/25/2023).  The patient or caregiver verbalized understanding of the material covered, with no barriers to understanding. All questions were answered. Patient or caregiver is agreeable with the plan outlined above.    It was a pleasure to see Isaiah Cordova.  If you have any questions or concerns regarding this evaluation, do not hesitate to contact me.  Maren Beach, MD [facility]

## 2023-07-28 ENCOUNTER — Other Ambulatory Visit (INDEPENDENT_AMBULATORY_CARE_PROVIDER_SITE_OTHER): Payer: BC Managed Care – PPO

## 2023-07-28 ENCOUNTER — Ambulatory Visit: Payer: BC Managed Care – PPO | Admitting: Pediatrics

## 2023-07-28 ENCOUNTER — Encounter: Payer: Self-pay | Admitting: Pediatrics

## 2023-07-28 VITALS — BP 118/82 | HR 74 | Ht 74.0 in | Wt 213.0 lb

## 2023-07-28 DIAGNOSIS — K219 Gastro-esophageal reflux disease without esophagitis: Secondary | ICD-10-CM | POA: Diagnosis not present

## 2023-07-28 DIAGNOSIS — K649 Unspecified hemorrhoids: Secondary | ICD-10-CM | POA: Diagnosis not present

## 2023-07-28 DIAGNOSIS — E739 Lactose intolerance, unspecified: Secondary | ICD-10-CM

## 2023-07-28 DIAGNOSIS — R197 Diarrhea, unspecified: Secondary | ICD-10-CM

## 2023-07-28 DIAGNOSIS — K580 Irritable bowel syndrome with diarrhea: Secondary | ICD-10-CM

## 2023-07-28 DIAGNOSIS — K5732 Diverticulitis of large intestine without perforation or abscess without bleeding: Secondary | ICD-10-CM | POA: Diagnosis not present

## 2023-07-28 DIAGNOSIS — K625 Hemorrhage of anus and rectum: Secondary | ICD-10-CM

## 2023-07-28 DIAGNOSIS — Z8719 Personal history of other diseases of the digestive system: Secondary | ICD-10-CM

## 2023-07-28 LAB — VITAMIN B12: Vitamin B-12: 238 pg/mL (ref 211–911)

## 2023-07-28 LAB — COMPREHENSIVE METABOLIC PANEL
ALT: 18 U/L (ref 0–53)
AST: 18 U/L (ref 0–37)
Albumin: 4.8 g/dL (ref 3.5–5.2)
Alkaline Phosphatase: 73 U/L (ref 39–117)
BUN: 16 mg/dL (ref 6–23)
CO2: 28 meq/L (ref 19–32)
Calcium: 9.5 mg/dL (ref 8.4–10.5)
Chloride: 104 meq/L (ref 96–112)
Creatinine, Ser: 0.96 mg/dL (ref 0.40–1.50)
GFR: 90.31 mL/min (ref >60.00)
Glucose, Bld: 86 mg/dL (ref 70–99)
Potassium: 4.9 meq/L (ref 3.5–5.1)
Sodium: 139 meq/L (ref 135–145)
Total Bilirubin: 1.8 mg/dL — ABNORMAL HIGH (ref 0.2–1.2)
Total Protein: 7.2 g/dL (ref 6.0–8.3)

## 2023-07-28 LAB — CBC WITH DIFFERENTIAL/PLATELET
Basophils Absolute: 0.1 10*3/uL (ref 0.0–0.1)
Basophils Relative: 0.7 % (ref 0.0–3.0)
Eosinophils Absolute: 0.3 10*3/uL (ref 0.0–0.7)
Eosinophils Relative: 3.6 % (ref 0.0–5.0)
HCT: 46.8 % (ref 39.0–52.0)
Hemoglobin: 15.6 g/dL (ref 13.0–17.0)
Lymphocytes Relative: 33.9 % (ref 12.0–46.0)
Lymphs Abs: 2.4 10*3/uL (ref 0.7–4.0)
MCHC: 33.3 g/dL (ref 30.0–36.0)
MCV: 90.2 fL (ref 78.0–100.0)
Monocytes Absolute: 0.5 10*3/uL (ref 0.1–1.0)
Monocytes Relative: 6.4 % (ref 3.0–12.0)
Neutro Abs: 3.9 10*3/uL (ref 1.4–7.7)
Neutrophils Relative %: 55.4 % (ref 43.0–77.0)
Platelets: 279 10*3/uL (ref 150.0–400.0)
RBC: 5.19 Mil/uL (ref 4.22–5.81)
RDW: 13.6 % (ref 11.5–15.5)
WBC: 7.1 10*3/uL (ref 4.0–10.5)

## 2023-07-28 LAB — TSH: TSH: 1.05 u[IU]/mL (ref 0.35–5.50)

## 2023-07-28 LAB — SEDIMENTATION RATE: Sed Rate: 3 mm/h (ref 0–20)

## 2023-07-28 LAB — HIGH SENSITIVITY CRP: CRP, High Sensitivity: 0.56 mg/L (ref 0.000–5.000)

## 2023-07-28 LAB — FOLATE: Folate: 13.1 ng/mL (ref >5.9)

## 2023-07-28 NOTE — Patient Instructions (Signed)
 Your provider has requested that you go to the basement level for lab work before leaving today. Press "B" on the elevator. The lab is located at the first door on the left as you exit the elevator.  It has been recommended to you by your physician that you have a(n) EGD/Colonoscopy completed. Per your request, we did not schedule the procedure(s) today. Please contact our office at [phone] should you decide to have the procedure completed. You will be scheduled for a pre-visit and procedure at that time.  _______________________________________________________  If your blood pressure at your visit was 140/90 or greater, please contact your primary care physician to follow up on this.  _______________________________________________________  If you are age 66 or older, your body mass index should be between 23-30. Your Body mass index is 27.35 kg/m. If this is out of the aforementioned range listed, please consider follow up with your Primary Care Provider.  If you are age 36 or younger, your body mass index should be between 19-25. Your Body mass index is 27.35 kg/m. If this is out of the aformentioned range listed, please consider follow up with your Primary Care Provider.   ________________________________________________________  The [facility] providers would like to encourage you to use [facility] to communicate with providers for non-urgent requests or questions.  Due to long hold times on the telephone, sending your provider a message by [facility] may be a faster and more efficient way to get a response.  Please allow 48 business hours for a response.  Please remember that this is for non-urgent requests.  _______________________________________________________

## 2023-07-31 ENCOUNTER — Telehealth: Payer: Self-pay | Admitting: Pediatrics

## 2023-07-31 NOTE — Telephone Encounter (Signed)
 Left message for patient to return my call.

## 2023-07-31 NOTE — Telephone Encounter (Signed)
 Inbound call from patient with questions regarding upcoming procedure. Please advise.   Thank you

## 2023-07-31 NOTE — Telephone Encounter (Signed)
 Isaiah Cordova called back and said he didn't call us and has no questions.

## 2023-08-02 LAB — IGA: Immunoglobulin A: 250 mg/dL (ref 47–310)

## 2023-08-02 LAB — ALPHA-GAL PANEL
Allergen, Mutton, f88: <0.1 kU/L
Allergen, Pork, f26: <0.1 kU/L
Beef: <0.1 kU/L
CLASS: 0
CLASS: 0
Class: 0
GALACTOSE-ALPHA-1,3-GALACTOSE IGE*: <0.1 kU/L (ref <0.10)

## 2023-08-02 LAB — INTERPRETATION:

## 2023-08-02 LAB — TISSUE TRANSGLUTAMINASE, IGA: (tTG) Ab, IgA: <1 U/mL

## 2023-08-30 ENCOUNTER — Telehealth: Payer: Self-pay | Admitting: Pediatrics

## 2023-08-30 DIAGNOSIS — E739 Lactose intolerance, unspecified: Secondary | ICD-10-CM

## 2023-08-30 DIAGNOSIS — K5732 Diverticulitis of large intestine without perforation or abscess without bleeding: Secondary | ICD-10-CM

## 2023-08-30 DIAGNOSIS — K625 Hemorrhage of anus and rectum: Secondary | ICD-10-CM

## 2023-08-30 DIAGNOSIS — R197 Diarrhea, unspecified: Secondary | ICD-10-CM

## 2023-08-30 DIAGNOSIS — K219 Gastro-esophageal reflux disease without esophagitis: Secondary | ICD-10-CM

## 2023-08-30 NOTE — Telephone Encounter (Signed)
 Patient called stated he is scheduled for a double procedure on 09/05/23 and has not heard from anyone regarding his prep instructions nor the prep medication. He also stated he call and left a message on Monday regarding his concern. Please advise

## 2023-09-01 MED ORDER — SUFLAVE 178.7 G PO SOLR: 1.0000 | Freq: Once | ORAL | 0 refills | Status: AC

## 2023-09-01 NOTE — Telephone Encounter (Signed)
 I spoke to Isaiah Cordova and I apologized that his prep was never sent to his pharmacy.   I advised him that we had a sample here in the office that he can pick up because I was afraid that his pharmacy may have a delay in getting the prep to him.  I reviewed his instructions with him and I told him that it will be in his my chart and a printed copy will be with his prep when he picks it up.

## 2023-09-01 NOTE — Addendum Note (Signed)
 Addended by: Myrtie Hawk on: 09/01/2023 02:07 PM   Modules accepted: Orders

## 2023-09-04 NOTE — Progress Notes (Unsigned)
 [facility] History and Physical   Primary Care Physician:  Mliss Sax, MD   Reason for Procedure:  GERD, screening for Barrett's esophagus, history of diverticulitis, diarrhea, hemorrhoids and rectal bleeding  Plan:    EGD and colonoscopy   HPI: ANTWAUN BUTH is a 53 y.o. male undergoing upper endoscopy for evaluation of GERD and Barrett's esophagus screening.  Reports a history of intermittent heartburn for which he takes omeprazole.  Last EGD performed 10/2015 and was normal.  No dysphagia or odynophagia.  Last colonoscopy performed fourth 2021 showing internal and external hemorrhoids as well as left colon diverticula.  He was diagnosed with uncomplicated left-sided diverticulitis without abscess in December 2024 treated with Augmentin.  No recurrence since completing antibiotics.  No family history of colorectal cancer or polyps.  He has undergone colonoscopy in the setting of recent diverticulitis.  Also endorses chronic longstanding diarrhea which could be related to IBS-D or lactose intolerance.   Past Medical History:  Diagnosis Date   Androgen deficiency 11/07/2017   Anxiety    Atypical chest pain 02/12/2020   Benign prostatic hyperplasia with weak urinary stream 02/12/2020   Bronchitis 05/21/2018   Cataract    bilateral removed   Concussion    Depression    Depression with anxiety 02/07/2019   Detached retina    2 right eye, 1 in left eye   DOE (dyspnea on exertion) 02/12/2020   Dyspnea on exertion 02/12/2020   Elevated LDL cholesterol level 02/07/2019   Fatigue 08/26/2013   Food allergy 11/07/2017   Gastric pain 10/16/2015   Gastric ulcer    Gastroesophageal reflux disease 10/16/2015   GERD (gastroesophageal reflux disease)    Gilbert's syndrome 01/24/2014   Headache(784.0)    chronic   Healthcare maintenance 06/16/2014   Hemorrhoids, external    History of retinal detachment 05/21/2018   Hypercholesteremia    Irritable bowel    Irritable  bowel syndrome with diarrhea 11/07/2017   Joint stiffness of both ankles 07/27/2018   Joint stiffness of both knees 07/27/2018   Left ankle injury     torn ligements x1   Midepigastric pain 10/27/2015   Need for influenza vaccination 02/12/2020   Neuromuscular disorder (HCC)    RLS   Rectal bleeding    history of, negative colonoscopy 2005-2006   Right ankle injury    torn ligemnts x3   RLS (restless legs syndrome) 11/16/2012    Past Surgical History:  Procedure Laterality Date   CATARACT EXTRACTION, BILATERAL     COLONOSCOPY     fracture collar bone     GAS INSERTION Right 04/22/2019   Procedure: Insertion Of Gas;  Surgeon: Stephannie Li, MD;  Location: [facility] OR;  Service: Ophthalmology;  Laterality: Right;   GAS/FLUID EXCHANGE Right 04/22/2019   Procedure: Gas/Fluid Exchange;  Surgeon: Stephannie Li, MD;  Location: [facility] OR;  Service: Ophthalmology;  Laterality: Right;   HERNIA REPAIR     multiplr fractures around left eye     NASAL SEPTUM SURGERY     PARS PLANA VITRECTOMY Right 04/22/2019   Procedure: PARS PLANA VITRECTOMY WITH 25 GAUGE;  Surgeon: Stephannie Li, MD;  Location: [facility] OR;  Service: Ophthalmology;  Laterality: Right;   PHOTOCOAGULATION WITH LASER Right 04/22/2019   Procedure: Photocoagulation With Laser;  Surgeon: Stephannie Li, MD;  Location: [facility] OR;  Service: Ophthalmology;  Laterality: Right;   POLYPECTOMY     RETINAL DETACHMENT SURGERY     right fractured hand     UPPER GASTROINTESTINAL  ENDOSCOPY      Prior to Admission medications   Medication Sig Start Date End Date Taking? Authorizing Provider  FLUoxetine (PROZAC) 40 MG capsule Take 1 capsule (40 mg total) by mouth daily. 05/29/23   Mliss Sax, MD    Current Outpatient Medications  Medication Sig Dispense Refill   FLUoxetine (PROZAC) 40 MG capsule Take 1 capsule (40 mg total) by mouth daily. 90 capsule 3   No current facility-administered medications for this visit.    Allergies as of  09/05/2023 - Review Complete 07/28/2023  Allergen Reaction Noted   Dust mite extract Other (See Comments) 11/16/2015   Lisdexamfetamine Other (See Comments) 05/06/2010   Other Other (See Comments) 11/16/2015   Pollen extract Other (See Comments) 11/16/2015    Family History  Problem Relation Age of Onset   Hypertension Father    Diabetes Father    Hyperlipidemia Father    Depression Maternal Grandfather        prostate   Prostate cancer Maternal Grandfather    Depression Paternal Grandfather        committed suicide   Hypothyroidism Other    Diabetes Mellitus II Brother    Colon cancer Neg Hx    Esophageal cancer Neg Hx    Rectal cancer Neg Hx    Stomach cancer Neg Hx     Social History   Socioeconomic History   Marital status: Married    Spouse name: Andrey Campanile   Number of children: 1   Years of education: Not on file   Highest education level: Not on file  Occupational History   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    Employer: [employer]  Tobacco Use   Smoking status: Never    Passive exposure: Never   Smokeless tobacco: Never  Vaping Use   Vaping status: Never Used  Substance and Sexual Activity   Alcohol use: Yes    Alcohol/week: 2.0 standard drinks of alcohol    Types: 2 Cans of beer per week    Comment: per month   Drug use: Never   Sexual activity: Yes  Other Topics Concern   Not on file  Social History Narrative   Regular exercise - yes   1 daughter 2 years old   Social Drivers of Corporate investment banker Strain: Not on file  Food Insecurity: Not on file  Transportation Needs: Not on file  Physical Activity: Not on file  Stress: Not on file  Social Connections: Not on file  Intimate Partner Violence: Not on file    Review of Systems:  All other review of systems negative except as mentioned in the HPI.  Physical Exam: Vital signs There were no vitals taken for this visit.  General:   Alert,  Well-developed, well-nourished, pleasant  and cooperative in NAD Airway:  Mallampati  Lungs:  Clear throughout to auscultation.   Heart:  Regular rate and rhythm; no murmurs, clicks, rubs,  or gallops. Abdomen:  Soft, nontender and nondistended. Normal bowel sounds.   Neuro/Psych:  Normal mood and affect. A and O x 3  Maren Beach, MD [facility]

## 2023-09-05 ENCOUNTER — Encounter: Payer: Self-pay | Admitting: Pediatrics

## 2023-09-05 ENCOUNTER — Ambulatory Visit: Payer: BC Managed Care – PPO | Admitting: Pediatrics

## 2023-09-05 VITALS — BP 125/79 | HR 57 | Temp 97.5°F | Resp 14 | Ht 74.0 in | Wt 213.0 lb

## 2023-09-05 DIAGNOSIS — K529 Noninfective gastroenteritis and colitis, unspecified: Secondary | ICD-10-CM | POA: Diagnosis not present

## 2023-09-05 DIAGNOSIS — Z8719 Personal history of other diseases of the digestive system: Secondary | ICD-10-CM

## 2023-09-05 DIAGNOSIS — Z13810 Encounter for screening for upper gastrointestinal disorder: Secondary | ICD-10-CM | POA: Diagnosis not present

## 2023-09-05 DIAGNOSIS — K625 Hemorrhage of anus and rectum: Secondary | ICD-10-CM

## 2023-09-05 DIAGNOSIS — K5732 Diverticulitis of large intestine without perforation or abscess without bleeding: Secondary | ICD-10-CM

## 2023-09-05 DIAGNOSIS — R197 Diarrhea, unspecified: Secondary | ICD-10-CM | POA: Diagnosis not present

## 2023-09-05 DIAGNOSIS — K219 Gastro-esophageal reflux disease without esophagitis: Secondary | ICD-10-CM

## 2023-09-05 DIAGNOSIS — K648 Other hemorrhoids: Secondary | ICD-10-CM | POA: Diagnosis not present

## 2023-09-05 DIAGNOSIS — K5730 Diverticulosis of large intestine without perforation or abscess without bleeding: Secondary | ICD-10-CM

## 2023-09-05 MED ORDER — SODIUM CHLORIDE 0.9 % IV SOLN: 500.0000 mL | INTRAVENOUS | Status: DC

## 2023-09-05 NOTE — Patient Instructions (Addendum)
 Handouts provided on diverticulosis and hemorrhoids.  Resume previous diet.  Continue present medications.  Continue Omeprazole as needed for GERD and heartburn symptoms. Await pathology results.  Repeat colonoscopy in 10 years for screening purposes.  Return to GI clinic 3-4 months (see appointment details).   YOU HAD AN ENDOSCOPIC PROCEDURE TODAY AT THE [facility]:   Refer to the procedure report that was given to you for any specific questions about what was found during the examination.  If the procedure report does not answer your questions, please call your gastroenterologist to clarify.  If you requested that your care partner not be given the details of your procedure findings, then the procedure report has been included in a sealed envelope for you to review at your convenience later.  YOU SHOULD EXPECT: Some feelings of bloating in the abdomen. Passage of more gas than usual.  Walking can help get rid of the air that was put into your GI tract during the procedure and reduce the bloating. If you had a lower endoscopy (such as a colonoscopy or flexible sigmoidoscopy) you may notice spotting of blood in your stool or on the toilet paper. If you underwent a bowel prep for your procedure, you may not have a normal bowel movement for a few days.  Please Note:  You might notice some irritation and congestion in your nose or some drainage.  This is from the oxygen used during your procedure.  There is no need for concern and it should clear up in a day or so.  SYMPTOMS TO REPORT IMMEDIATELY:  Following lower endoscopy (colonoscopy or flexible sigmoidoscopy):  Excessive amounts of blood in the stool  Significant tenderness or worsening of abdominal pains  Swelling of the abdomen that is new, acute  Fever of 100F or higher  Following upper endoscopy (EGD)  Vomiting of blood or coffee ground material  New chest pain or pain under the shoulder blades  Painful or persistently  difficult swallowing  New shortness of breath  Fever of 100F or higher  Black, tarry-looking stools  For urgent or emergent issues, a gastroenterologist can be reached at any hour by calling [phone]. Do not use MyChart messaging for urgent concerns.    DIET:  We do recommend a small meal at first, but then you may proceed to your regular diet.  Drink plenty of fluids but you should avoid alcoholic beverages for 24 hours.  ACTIVITY:  You should plan to take it easy for the rest of today and you should NOT DRIVE or use heavy machinery until tomorrow (because of the sedation medicines used during the test).    FOLLOW UP: Our staff will call the number listed on your records the next business day following your procedure.  We will call around 7:15- 8:00 am to check on you and address any questions or concerns that you may have regarding the information given to you following your procedure. If we do not reach you, we will leave a message.     If any biopsies were taken you will be contacted by phone or by letter within the next 1-3 weeks.  Please call us at [phone] if you have not heard about the biopsies in 3 weeks.    SIGNATURES/CONFIDENTIALITY: You and/or your care partner have signed paperwork which will be entered into your electronic medical record.  These signatures attest to the fact that that the information above on your After Visit Summary has been reviewed and is  understood.  Full responsibility of the confidentiality of this discharge information lies with you and/or your care-partner.

## 2023-09-05 NOTE — Progress Notes (Signed)
 Called to room to assist during endoscopic procedure.  Patient ID and intended procedure confirmed with present staff. Received instructions for my participation in the procedure from the performing physician.

## 2023-09-05 NOTE — Op Note (Signed)
 [facility] Patient Name: Konner Saiz Procedure Date: 09/05/2023 2:32 PM MRN: [id number] Endoscopist: Maren Beach , MD, [id number] Age: 53 Referring MD:  Date of Birth: [birth year] Gender: Male Account #: [id number] Procedure:                Upper GI endoscopy Indications:              Follow-up of gastro-esophageal reflux disease,                            Screening for Barrett's esophagus in patient at                            risk for this condition Medicines:                Monitored Anesthesia Care Procedure:                Pre-Anesthesia Assessment:                           - Prior to the procedure, a History and Physical                            was performed, and patient medications and                            allergies were reviewed. The patient's tolerance of                            previous anesthesia was also reviewed. The risks                            and benefits of the procedure and the sedation                            options and risks were discussed with the patient.                            All questions were answered, and informed consent                            was obtained. Prior Anticoagulants: The patient has                            taken no anticoagulant or antiplatelet agents. ASA                            Grade Assessment: II - A patient with mild systemic                            disease. After reviewing the risks and benefits,                            the patient was deemed in satisfactory condition to  undergo the procedure.                           After obtaining informed consent, the endoscope was                            passed under direct vision. Throughout the                            procedure, the patient's blood pressure, pulse, and                            oxygen saturations were monitored continuously. The                            GIF [id number] #[id number] was introduced  through the                            mouth, and advanced to the second part of duodenum.                            The upper GI endoscopy was accomplished without                            difficulty. The patient tolerated the procedure                            well. Scope In: Scope Out: Findings:                 The examined esophagus was normal. No endoscopic                            evidence of Barrett's esophagus.                           The gastric body, gastric antrum, cardia (on                            retroflexion) and gastric fundus (on retroflexion)                            were normal.                           The duodenal bulb and second portion of the                            duodenum were normal. Complications:            No immediate complications. Estimated blood loss:                            None. Estimated Blood Loss:     Estimated blood loss: none. Impression:               - Normal esophagus. No endoscopic evidence of  Barrett's esophagus.                           - Normal gastric body, antrum, cardia and gastric                            fundus.                           - Normal duodenal bulb and second portion of the                            duodenum.                           - No specimens collected. Recommendation:           - Continue omeprazole as needed for GERD and                            heartburn symptoms.                           - Perform a colonoscopy today.                           - The findings and recommendations were discussed                            with the patient's family. Maren Beach, MD 09/05/2023 3:11:15 PM This report has been signed electronically.

## 2023-09-05 NOTE — Op Note (Signed)
 [facility] Patient Name: Isaiah Cordova Procedure Date: 09/05/2023 2:30 PM MRN: [id number] Endoscopist: Maren Beach , MD, [id number] Age: 53 Referring MD:  Date of Birth: August 02, 1970 Gender: Male Account #: [id number] Procedure:                Colonoscopy Indications:              Last colonoscopy: April 2021, Chronic diarrhea,                            Follow-up of diverticulitis diagnosed 04/2023. Medicines:                Monitored Anesthesia Care Procedure:                Pre-Anesthesia Assessment:                           - Prior to the procedure, a History and Physical                            was performed, and patient medications and                            allergies were reviewed. The patient's tolerance of                            previous anesthesia was also reviewed. The risks                            and benefits of the procedure and the sedation                            options and risks were discussed with the patient.                            All questions were answered, and informed consent                            was obtained. Prior Anticoagulants: The patient has                            taken no anticoagulant or antiplatelet agents. ASA                            Grade Assessment: II - A patient with mild systemic                            disease. After reviewing the risks and benefits,                            the patient was deemed in satisfactory condition to                            undergo the procedure.  After obtaining informed consent, the colonoscope                            was passed under direct vision. Throughout the                            procedure, the patient's blood pressure, pulse, and                            oxygen saturations were monitored continuously. The                            Olympus Scope SN: [id number] was introduced through                            the anus and  advanced to the terminal ileum. The                            colonoscopy was performed without difficulty. The                            patient tolerated the procedure well. The quality                            of the bowel preparation was good. The terminal                            ileum, ileocecal valve, appendiceal orifice, and                            rectum were photographed. Scope In: 2:56:34 PM Scope Out: 3:08:17 PM Scope Withdrawal Time: 0 hours 9 minutes 10 seconds  Total Procedure Duration: 0 hours 11 minutes 43 seconds  Findings:                 Hemorrhoids were found on perianal exam.                           The digital rectal exam was normal. Pertinent                            negatives include normal sphincter tone and no                            palpable rectal lesions.                           Multiple small-mouthed diverticula were found in                            the sigmoid colon, descending colon and ascending                            colon.  Normal mucosa was found in the entire colon.                            Biopsies for histology were taken with a cold                            forceps for evaluation of microscopic colitis.                           The terminal ileum appeared normal.                           Internal hemorrhoids were found during retroflexion. Complications:            No immediate complications. Estimated blood loss:                            Minimal. Estimated Blood Loss:     Estimated blood loss was minimal. Impression:               - Hemorrhoids found on perianal exam.                           - Diverticulosis in the sigmoid colon, in the                            descending colon and in the ascending colon.                           - Normal mucosa in the entire examined colon.                            Biopsied.                           - The examined portion of the ileum was  normal.                           - Internal hemorrhoids. Recommendation:           - Discharge patient to home (ambulatory).                           - Await pathology results.                           - Repeat colonoscopy in 10 years for screening                            purposes.                           - The findings and recommendations were discussed                            with the patient's family.                           -  Return to GI clinic in 3-4 months.                           - Patient has a contact number available for                            emergencies. The signs and symptoms of potential                            delayed complications were discussed with the                            patient. Return to normal activities tomorrow.                            Written discharge instructions were provided to the                            patient. Maren Beach, MD 09/05/2023 3:15:36 PM This report has been signed electronically.

## 2023-09-05 NOTE — Progress Notes (Signed)
 Vss nad trans to pacu

## 2023-09-06 ENCOUNTER — Telehealth: Payer: Self-pay

## 2023-09-06 NOTE — Telephone Encounter (Signed)
  Follow up Call-     09/05/2023    2:34 PM  Call back number  Post procedure Call Back phone  # [phone]  Permission to leave phone message Yes     Patient questions:  Do you have a fever, pain , or abdominal swelling? No. Pain Score  0 *  Have you tolerated food without any problems? Yes.    Have you been able to return to your normal activities? Yes.    Do you have any questions about your discharge instructions: Diet   No. Medications  No. Follow up visit  No.  Do you have questions or concerns about your Care? No.  Actions: * If pain score is 4 or above: No action needed, pain <4.

## 2023-09-08 ENCOUNTER — Encounter: Payer: Self-pay | Admitting: Pediatrics

## 2023-09-08 LAB — SURGICAL PATHOLOGY

## 2023-11-09 ENCOUNTER — Encounter: Payer: Self-pay | Admitting: Family Medicine

## 2023-11-09 ENCOUNTER — Ambulatory Visit: Payer: BC Managed Care – PPO | Admitting: Family Medicine

## 2023-11-09 VITALS — BP 112/80 | HR 67 | Temp 96.7°F | Ht 74.0 in | Wt 207.6 lb

## 2023-11-09 DIAGNOSIS — Z8719 Personal history of other diseases of the digestive system: Secondary | ICD-10-CM

## 2023-11-09 DIAGNOSIS — F418 Other specified anxiety disorders: Secondary | ICD-10-CM

## 2023-11-09 NOTE — Progress Notes (Signed)
   Established Patient Office Visit   Subjective:  Patient ID: Isaiah Cordova, male    DOB: 15-Dec-1970  Age: 53 y.o. MRN: [id number]  Chief Complaint  Patient presents with   Follow-up    Follow up for Diverticulities no concerns     HPI Encounter Diagnoses  Name Primary?   Depression with anxiety Yes   History of diverticulitis    For follow-up of above.  Status post normal colonoscopy and endoscopy.  No further abdominal pain or hematochezia.  Continues with Prozac .  It is working well.  His job in [place] ended.  This has been a great stress reliever for him.  He no longer has to drive to [place] 4 days weekly.  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   Review of Systems  Constitutional: Negative.   HENT: Negative.    Eyes:  Negative for blurred vision, discharge and redness.  Respiratory: Negative.    Cardiovascular: Negative.   Gastrointestinal:  Negative for abdominal pain, blood in stool, constipation and diarrhea.  Genitourinary: Negative.   Musculoskeletal: Negative.  Negative for myalgias.  Skin:  Negative for rash.  Neurological:  Negative for tingling, loss of consciousness and weakness.  Endo/Heme/Allergies:  Negative for polydipsia.  Psychiatric/Behavioral:  Negative for depression. The patient is not nervous/anxious.      Current Outpatient Medications:    FLUoxetine  (PROZAC ) 40 MG capsule, Take 1 capsule (40 mg total) by mouth daily., Disp: 90 capsule, Rfl: 3   Objective:     BP 112/80 (BP Location: Left Arm, Patient Position: Sitting, Cuff Size: Normal)   Pulse 67   Temp (!) 96.7 F (35.9 C) (Temporal)   Ht 6' 2 (1.88 m)   Wt 207 lb 9.6 oz (94.2 kg)   SpO2 97%   BMI 26.65 kg/m    Physical Exam Constitutional:      General: He is not in acute distress.    Appearance: Normal appearance. He is not ill-appearing, toxic-appearing or diaphoretic.  HENT:     Head: Normocephalic and atraumatic.      Right Ear: External ear normal.     Left Ear: External ear normal.   Eyes:     General: No scleral icterus.       Right eye: No discharge.        Left eye: No discharge.     Extraocular Movements: Extraocular movements intact.     Conjunctiva/sclera: Conjunctivae normal.   Pulmonary:     Effort: Pulmonary effort is normal. No respiratory distress.   Skin:    General: Skin is warm and dry.   Neurological:     Mental Status: He is alert and oriented to person, place, and time.   Psychiatric:        Mood and Affect: Mood normal.        Behavior: Behavior normal.      No results found for any visits on 11/09/23.    The 10-year ASCVD risk score (Arnett DK, et al., 2019) is: 4%    Assessment & Plan:   Depression with anxiety  History of diverticulitis    Return in about 6 months (around 05/10/2024) for annual physical, anxiety and depression.    Tonna Frederic, MD

## 2023-12-05 ENCOUNTER — Ambulatory Visit: Admitting: Pediatrics

## 2023-12-22 DIAGNOSIS — H26492 Other secondary cataract, left eye: Secondary | ICD-10-CM | POA: Diagnosis not present

## 2023-12-22 DIAGNOSIS — H40022 Open angle with borderline findings, high risk, left eye: Secondary | ICD-10-CM | POA: Diagnosis not present

## 2023-12-22 DIAGNOSIS — H5213 Myopia, bilateral: Secondary | ICD-10-CM | POA: Diagnosis not present

## 2023-12-22 DIAGNOSIS — H31003 Unspecified chorioretinal scars, bilateral: Secondary | ICD-10-CM | POA: Diagnosis not present

## 2024-01-09 DIAGNOSIS — H26492 Other secondary cataract, left eye: Secondary | ICD-10-CM | POA: Diagnosis not present

## 2024-05-02 DIAGNOSIS — H04123 Dry eye syndrome of bilateral lacrimal glands: Secondary | ICD-10-CM | POA: Diagnosis not present

## 2024-05-02 DIAGNOSIS — H40023 Open angle with borderline findings, high risk, bilateral: Secondary | ICD-10-CM | POA: Diagnosis not present

## 2024-05-07 DIAGNOSIS — M9901 Segmental and somatic dysfunction of cervical region: Secondary | ICD-10-CM | POA: Diagnosis not present

## 2024-05-07 DIAGNOSIS — M62838 Other muscle spasm: Secondary | ICD-10-CM | POA: Diagnosis not present

## 2024-05-07 DIAGNOSIS — M9903 Segmental and somatic dysfunction of lumbar region: Secondary | ICD-10-CM | POA: Diagnosis not present

## 2024-05-08 DIAGNOSIS — M9901 Segmental and somatic dysfunction of cervical region: Secondary | ICD-10-CM | POA: Diagnosis not present

## 2024-05-08 DIAGNOSIS — M9903 Segmental and somatic dysfunction of lumbar region: Secondary | ICD-10-CM | POA: Diagnosis not present

## 2024-05-08 DIAGNOSIS — M62838 Other muscle spasm: Secondary | ICD-10-CM | POA: Diagnosis not present

## 2024-05-13 ENCOUNTER — Encounter: Payer: Self-pay | Admitting: Family Medicine

## 2024-05-13 ENCOUNTER — Ambulatory Visit: Admitting: Family Medicine

## 2024-05-13 VITALS — BP 118/74 | HR 58 | Temp 97.5°F | Ht 74.0 in | Wt 213.8 lb

## 2024-05-13 DIAGNOSIS — Z131 Encounter for screening for diabetes mellitus: Secondary | ICD-10-CM

## 2024-05-13 DIAGNOSIS — Z13220 Encounter for screening for lipoid disorders: Secondary | ICD-10-CM

## 2024-05-13 DIAGNOSIS — K580 Irritable bowel syndrome with diarrhea: Secondary | ICD-10-CM

## 2024-05-13 DIAGNOSIS — N401 Enlarged prostate with lower urinary tract symptoms: Secondary | ICD-10-CM | POA: Diagnosis not present

## 2024-05-13 DIAGNOSIS — R3912 Poor urinary stream: Secondary | ICD-10-CM

## 2024-05-13 DIAGNOSIS — Z0000 Encounter for general adult medical examination without abnormal findings: Secondary | ICD-10-CM

## 2024-05-13 DIAGNOSIS — F418 Other specified anxiety disorders: Secondary | ICD-10-CM | POA: Diagnosis not present

## 2024-05-13 LAB — LIPID PANEL
Cholesterol: 239 mg/dL — ABNORMAL HIGH (ref 0–200)
HDL: 46.9 mg/dL (ref >39.00)
LDL Cholesterol: 173 mg/dL — ABNORMAL HIGH (ref 0–99)
NonHDL: 192.56
Total CHOL/HDL Ratio: 5
Triglycerides: 99 mg/dL (ref 0.0–149.0)
VLDL: 19.8 mg/dL (ref 0.0–40.0)

## 2024-05-13 LAB — COMPREHENSIVE METABOLIC PANEL WITH GFR
ALT: 15 U/L (ref 0–53)
AST: 15 U/L (ref 0–37)
Albumin: 4.5 g/dL (ref 3.5–5.2)
Alkaline Phosphatase: 67 U/L (ref 39–117)
BUN: 18 mg/dL (ref 6–23)
CO2: 29 meq/L (ref 19–32)
Calcium: 9.4 mg/dL (ref 8.4–10.5)
Chloride: 106 meq/L (ref 96–112)
Creatinine, Ser: 0.82 mg/dL (ref 0.40–1.50)
GFR: 99.81 mL/min (ref >60.00)
Glucose, Bld: 91 mg/dL (ref 70–99)
Potassium: 4.4 meq/L (ref 3.5–5.1)
Sodium: 141 meq/L (ref 135–145)
Total Bilirubin: 1.6 mg/dL — ABNORMAL HIGH (ref 0.2–1.2)
Total Protein: 6.4 g/dL (ref 6.0–8.3)

## 2024-05-13 LAB — CBC WITH DIFFERENTIAL/PLATELET
Basophils Absolute: 0.1 K/uL (ref 0.0–0.1)
Basophils Relative: 1 % (ref 0.0–3.0)
Eosinophils Absolute: 0.3 K/uL (ref 0.0–0.7)
Eosinophils Relative: 5.4 % — ABNORMAL HIGH (ref 0.0–5.0)
HCT: 43.1 % (ref 39.0–52.0)
Hemoglobin: 14.5 g/dL (ref 13.0–17.0)
Lymphocytes Relative: 37.3 % (ref 12.0–46.0)
Lymphs Abs: 2 K/uL (ref 0.7–4.0)
MCHC: 33.7 g/dL (ref 30.0–36.0)
MCV: 90.1 fl (ref 78.0–100.0)
Monocytes Absolute: 0.4 K/uL (ref 0.1–1.0)
Monocytes Relative: 7.4 % (ref 3.0–12.0)
Neutro Abs: 2.6 K/uL (ref 1.4–7.7)
Neutrophils Relative %: 48.9 % (ref 43.0–77.0)
Platelets: 250 K/uL (ref 150.0–400.0)
RBC: 4.78 Mil/uL (ref 4.22–5.81)
RDW: 13.3 % (ref 11.5–15.5)
WBC: 5.4 K/uL (ref 4.0–10.5)

## 2024-05-13 LAB — HEMOGLOBIN A1C: Hgb A1c MFr Bld: 5.3 % (ref 4.6–6.5)

## 2024-05-13 LAB — URINALYSIS, ROUTINE W REFLEX MICROSCOPIC
Bilirubin Urine: NEGATIVE
Hgb urine dipstick: NEGATIVE
Ketones, ur: NEGATIVE
Leukocytes,Ua: NEGATIVE
Nitrite: NEGATIVE
RBC / HPF: NONE SEEN (ref 0–?)
Specific Gravity, Urine: 1.025 (ref 1.000–1.030)
Total Protein, Urine: NEGATIVE
Urine Glucose: NEGATIVE
Urobilinogen, UA: 0.2 (ref 0.0–1.0)
WBC, UA: NONE SEEN (ref 0–?)
pH: 6 (ref 5.0–8.0)

## 2024-05-13 LAB — PSA: PSA: 0.94 ng/mL (ref 0.10–4.00)

## 2024-05-13 MED ORDER — FLUOXETINE HCL 40 MG PO CAPS: 40.0000 mg | ORAL_CAPSULE | Freq: Every day | ORAL | 3 refills | Status: AC

## 2024-05-13 MED ORDER — COLESTIPOL HCL 1 G PO TABS: 1.0000 g | ORAL_TABLET | Freq: Two times a day (BID) | ORAL | 3 refills | Status: AC | PRN

## 2024-05-13 NOTE — Progress Notes (Signed)
 Established Patient Office Visit   Subjective:  Patient ID: Isaiah Cordova, male    DOB: [birth year]  Age: 53 y.o. MRN: [id number]  Chief Complaint  Patient presents with   Annual Exam    FASTING     HPI Encounter Diagnoses  Name Primary?   Healthcare maintenance Yes   Screening for diabetes mellitus    Screening for cholesterol level    Benign prostatic hyperplasia with weak urinary stream    Irritable bowel syndrome with diarrhea    Depression with anxiety    For physical and follow-up of above.  Stress levels are reduced with work.  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  Not currently exercising.  He does have regular dental care.  Things are well at home.  Noticed loose stooling frequently after meals.  Denies fevers chills blood or pus in his stool.  Consistent with his history of IBS.  Decreased force of urinary stream.  There is no nocturia.  No urinary frequency or urgency.   Review of Systems  Constitutional: Negative.   HENT: Negative.    Eyes:  Negative for blurred vision, discharge and redness.  Respiratory: Negative.    Cardiovascular: Negative.   Gastrointestinal:  Negative for abdominal pain.  Genitourinary: Negative.   Musculoskeletal: Negative.  Negative for myalgias.  Skin:  Negative for rash.  Neurological:  Negative for tingling, loss of consciousness and weakness.  Endo/Heme/Allergies:  Negative for polydipsia.      05/13/2024    9:00 AM 05/09/2023    2:12 PM 03/18/2022    2:57 PM  Depression screen PHQ 2/9  Decreased Interest 0 2 1  Down, Depressed, Hopeless 0 1 2  PHQ - 2 Score 0 3 3  Altered sleeping 2 2 3   Tired, decreased energy 2 3 2   Change in appetite 0 0 0  Feeling bad or failure about yourself  0 1 1  Trouble concentrating 0 0 2  Moving slowly or fidgety/restless 0 0 0  Suicidal thoughts 0 0 1  PHQ-9 Score 4 9  12    Difficult doing work/chores Not difficult at all Somewhat difficult Somewhat difficult     Data  saved with a previous flowsheet row definition     Current Medications[1]   Objective:     BP 118/74 (BP Location: Right Arm, Patient Position: Sitting, Cuff Size: Large)   Pulse (!) 58   Temp (!) 97.5 F (36.4 C) (Oral)   Ht 6' 2 (1.88 m)   Wt 213 lb 12.8 oz (97 kg)   SpO2 97%   BMI 27.45 kg/m    Physical Exam Constitutional:      General: He is not in acute distress.    Appearance: Normal appearance. He is not ill-appearing, toxic-appearing or diaphoretic.  HENT:     Head: Normocephalic and atraumatic.     Right Ear: Tympanic membrane, ear canal and external ear normal.     Left Ear: Tympanic membrane, ear canal and external ear normal.     Mouth/Throat:     Mouth: Mucous membranes are moist.     Pharynx: Oropharynx is clear. No oropharyngeal exudate or posterior oropharyngeal erythema.  Eyes:     General: No scleral icterus.       Right eye: No discharge.        Left eye: No discharge.     Extraocular Movements: Extraocular movements intact.     Conjunctiva/sclera: Conjunctivae normal.     Pupils: Pupils  are equal, round, and reactive to light.  Cardiovascular:     Rate and Rhythm: Normal rate and regular rhythm.  Pulmonary:     Effort: Pulmonary effort is normal. No respiratory distress.     Breath sounds: Normal breath sounds. No wheezing or rales.  Abdominal:     General: Bowel sounds are normal.     Tenderness: There is no abdominal tenderness. There is no guarding.     Hernia: There is no hernia in the left inguinal area or right inguinal area.  Genitourinary:    Penis: Circumcised. No hypospadias, erythema, tenderness, discharge, swelling or lesions.      Testes:        Right: Tenderness, swelling, testicular hydrocele or varicocele not present. Right testis is descended. Cremasteric reflex is present.         Left: Tenderness, swelling, testicular hydrocele or varicocele not present. Left testis is descended. Cremasteric reflex is present.       Epididymis:     Right: Not inflamed or enlarged. No mass or tenderness.     Left: Not inflamed or enlarged. No mass or tenderness.  Musculoskeletal:     Cervical back: No rigidity or tenderness.  Lymphadenopathy:     Cervical: No cervical adenopathy.     Lower Body: No right inguinal adenopathy. No left inguinal adenopathy.  Skin:    General: Skin is warm and dry.  Neurological:     Mental Status: He is alert and oriented to person, place, and time.  Psychiatric:        Mood and Affect: Mood normal.        Behavior: Behavior normal.      No results found for any visits on 05/13/24.    The 10-year ASCVD risk score (Arnett DK, et al., 2019) is: 4.7%    Assessment & Plan:   Healthcare maintenance -     CBC with Differential/Platelet -     Urinalysis, Routine w reflex microscopic  Screening for diabetes mellitus -     Comprehensive metabolic panel with GFR -     Hemoglobin A1c  Screening for cholesterol level -     Comprehensive metabolic panel with GFR -     Lipid panel  Benign prostatic hyperplasia with weak urinary stream -     PSA  Irritable bowel syndrome with diarrhea -     Colestipol  HCl; Take 1 tablet (1 g total) by mouth 3 times/day as needed-between meals & bedtime.  Dispense: 60 tablet; Refill: 3  Depression with anxiety -     FLUoxetine  HCl; Take 1 capsule (40 mg total) by mouth daily.  Dispense: 90 capsule; Refill: 3    Return in about 1 year (around 05/13/2025), or if symptoms worsen or fail to improve.  Trial of Colestid  before meals that tend to lead to loose stooling.  Patient declines treatment for BPH symptoms at this time.  Continue Prozac .  Information was given on health maintenance and disease prevention.  Encouraged regular exercise for 30 minutes most days weekly.  Information given on exercising to stay healthy  Elsie Sim Lent, MD    [1]  Current Outpatient Medications:    colestipol  (COLESTID ) 1 g tablet, Take 1 tablet (1 g  total) by mouth 3 times/day as needed-between meals & bedtime., Disp: 60 tablet, Rfl: 3   timolol (TIMOPTIC) 0.5 % ophthalmic solution, Place 1 drop into both eyes daily., Disp: , Rfl:    FLUoxetine  (PROZAC ) 40 MG capsule, Take 1 capsule (  40 mg total) by mouth daily., Disp: 90 capsule, Rfl: 3

## 2024-05-14 ENCOUNTER — Ambulatory Visit: Payer: Self-pay | Admitting: Family Medicine

## 2025-05-15 ENCOUNTER — Encounter: Admitting: Family Medicine
